# Patient Record
Sex: Male | Born: 1935 | Race: White | Hispanic: No | Marital: Married | Smoking: Former smoker
Health system: Southern US, Community
[De-identification: ages and names within clinical notes are randomized; demographics above are authoritative.]

## PROBLEM LIST (undated history)

## (undated) DIAGNOSIS — I2510 Atherosclerotic heart disease of native coronary artery without angina pectoris: Secondary | ICD-10-CM

## (undated) DIAGNOSIS — D801 Nonfamilial hypogammaglobulinemia: Principal | ICD-10-CM

## (undated) DIAGNOSIS — N183 Chronic kidney disease, stage 3 unspecified: Secondary | ICD-10-CM

## (undated) DIAGNOSIS — J45909 Unspecified asthma, uncomplicated: Secondary | ICD-10-CM

## (undated) DIAGNOSIS — I10 Essential (primary) hypertension: Secondary | ICD-10-CM

## (undated) DIAGNOSIS — C9110 Chronic lymphocytic leukemia of B-cell type not having achieved remission: Secondary | ICD-10-CM

## (undated) DIAGNOSIS — C801 Malignant (primary) neoplasm, unspecified: Secondary | ICD-10-CM

## (undated) DIAGNOSIS — M109 Gout, unspecified: Secondary | ICD-10-CM

## (undated) DIAGNOSIS — N200 Calculus of kidney: Secondary | ICD-10-CM

## (undated) DIAGNOSIS — E785 Hyperlipidemia, unspecified: Secondary | ICD-10-CM

## (undated) DIAGNOSIS — C4490 Unspecified malignant neoplasm of skin, unspecified: Secondary | ICD-10-CM

## (undated) DIAGNOSIS — Z91041 Radiographic dye allergy status: Secondary | ICD-10-CM

## (undated) DIAGNOSIS — G459 Transient cerebral ischemic attack, unspecified: Secondary | ICD-10-CM

## (undated) DIAGNOSIS — H9190 Unspecified hearing loss, unspecified ear: Secondary | ICD-10-CM

## (undated) HISTORY — DX: Calculus of kidney: N20.0

## (undated) HISTORY — DX: Nonfamilial hypogammaglobulinemia: D80.1

## (undated) HISTORY — DX: Transient cerebral ischemic attack, unspecified: G45.9

## (undated) HISTORY — DX: Gout, unspecified: M10.9

## (undated) HISTORY — DX: Radiographic dye allergy status: Z91.041

## (undated) HISTORY — DX: Essential (primary) hypertension: I10

## (undated) HISTORY — DX: Chronic lymphocytic leukemia of B-cell type not having achieved remission: C91.10

## (undated) HISTORY — DX: Atherosclerotic heart disease of native coronary artery without angina pectoris: I25.10

## (undated) HISTORY — DX: Hyperlipidemia, unspecified: E78.5

## (undated) HISTORY — PX: OTHER SURGICAL HISTORY: SHX169

---

## 1984-11-26 HISTORY — PX: CORONARY ARTERY BYPASS GRAFT: SHX141

## 1994-11-26 HISTORY — PX: CORONARY ARTERY BYPASS GRAFT: SHX141

## 1999-04-22 ENCOUNTER — Emergency Department (HOSPITAL_COMMUNITY): Admission: EM | Admit: 1999-04-22 | Discharge: 1999-04-22 | Payer: Self-pay | Admitting: Emergency Medicine

## 1999-04-22 ENCOUNTER — Encounter: Payer: Self-pay | Admitting: Emergency Medicine

## 2000-06-04 ENCOUNTER — Inpatient Hospital Stay (HOSPITAL_COMMUNITY): Admission: AD | Admit: 2000-06-04 | Discharge: 2000-06-06 | Payer: Self-pay | Admitting: *Deleted

## 2000-06-04 ENCOUNTER — Encounter: Payer: Self-pay | Admitting: *Deleted

## 2004-07-20 ENCOUNTER — Ambulatory Visit (HOSPITAL_COMMUNITY): Admission: RE | Admit: 2004-07-20 | Discharge: 2004-07-20 | Payer: Self-pay | Admitting: *Deleted

## 2004-08-03 ENCOUNTER — Encounter (INDEPENDENT_AMBULATORY_CARE_PROVIDER_SITE_OTHER): Payer: Self-pay | Admitting: *Deleted

## 2004-08-03 ENCOUNTER — Inpatient Hospital Stay (HOSPITAL_COMMUNITY): Admission: RE | Admit: 2004-08-03 | Discharge: 2004-08-04 | Payer: Self-pay | Admitting: *Deleted

## 2004-09-25 ENCOUNTER — Emergency Department (HOSPITAL_COMMUNITY): Admission: EM | Admit: 2004-09-25 | Discharge: 2004-09-25 | Payer: Self-pay | Admitting: Emergency Medicine

## 2005-02-13 ENCOUNTER — Ambulatory Visit: Payer: Self-pay | Admitting: *Deleted

## 2005-02-13 ENCOUNTER — Ambulatory Visit: Payer: Self-pay

## 2005-06-13 ENCOUNTER — Ambulatory Visit: Payer: Self-pay | Admitting: *Deleted

## 2005-10-11 ENCOUNTER — Observation Stay (HOSPITAL_COMMUNITY): Admission: EM | Admit: 2005-10-11 | Discharge: 2005-10-12 | Payer: Self-pay | Admitting: Emergency Medicine

## 2005-10-12 ENCOUNTER — Ambulatory Visit: Payer: Self-pay | Admitting: *Deleted

## 2005-10-17 ENCOUNTER — Ambulatory Visit: Payer: Self-pay | Admitting: *Deleted

## 2005-10-17 ENCOUNTER — Ambulatory Visit: Payer: Self-pay

## 2005-10-23 ENCOUNTER — Ambulatory Visit: Payer: Self-pay | Admitting: *Deleted

## 2005-11-20 ENCOUNTER — Ambulatory Visit: Payer: Self-pay | Admitting: *Deleted

## 2006-01-24 ENCOUNTER — Ambulatory Visit: Payer: Self-pay | Admitting: *Deleted

## 2006-01-31 ENCOUNTER — Ambulatory Visit: Payer: Self-pay | Admitting: *Deleted

## 2006-05-14 ENCOUNTER — Ambulatory Visit: Payer: Self-pay | Admitting: *Deleted

## 2006-05-15 ENCOUNTER — Ambulatory Visit (HOSPITAL_COMMUNITY): Admission: RE | Admit: 2006-05-15 | Discharge: 2006-05-16 | Payer: Self-pay | Admitting: Cardiology

## 2006-05-15 ENCOUNTER — Ambulatory Visit: Payer: Self-pay | Admitting: Cardiology

## 2006-05-15 ENCOUNTER — Inpatient Hospital Stay (HOSPITAL_BASED_OUTPATIENT_CLINIC_OR_DEPARTMENT_OTHER): Admission: RE | Admit: 2006-05-15 | Discharge: 2006-05-15 | Payer: Self-pay | Admitting: Cardiology

## 2006-06-11 ENCOUNTER — Ambulatory Visit: Payer: Self-pay | Admitting: *Deleted

## 2006-09-24 ENCOUNTER — Ambulatory Visit: Payer: Self-pay | Admitting: *Deleted

## 2006-09-25 ENCOUNTER — Ambulatory Visit: Payer: Self-pay | Admitting: *Deleted

## 2006-12-16 ENCOUNTER — Ambulatory Visit: Payer: Self-pay | Admitting: *Deleted

## 2007-02-13 ENCOUNTER — Ambulatory Visit: Payer: Self-pay

## 2007-04-24 ENCOUNTER — Ambulatory Visit: Payer: Self-pay | Admitting: *Deleted

## 2007-05-01 ENCOUNTER — Ambulatory Visit: Payer: Self-pay | Admitting: *Deleted

## 2007-05-01 ENCOUNTER — Ambulatory Visit: Payer: Self-pay

## 2007-05-01 LAB — CONVERTED CEMR LAB
Calcium: 9.6 mg/dL (ref 8.4–10.5)
Cholesterol: 180 mg/dL (ref 0–200)
Creatinine, Ser: 1.4 mg/dL (ref 0.4–1.5)
GFR calc Af Amer: 64 mL/min
Glucose, Bld: 116 mg/dL — ABNORMAL HIGH (ref 70–99)
HDL: 26.8 mg/dL — ABNORMAL LOW (ref >39.0)
Potassium: 4.1 meq/L (ref 3.5–5.1)
Total CHOL/HDL Ratio: 6.7
Triglycerides: 307 mg/dL (ref 0–149)
VLDL: 61 mg/dL — ABNORMAL HIGH (ref 0–40)

## 2007-07-09 ENCOUNTER — Ambulatory Visit: Payer: Self-pay | Admitting: Cardiology

## 2007-12-02 ENCOUNTER — Ambulatory Visit: Payer: Self-pay | Admitting: Cardiology

## 2007-12-12 ENCOUNTER — Ambulatory Visit: Payer: Self-pay

## 2007-12-12 LAB — CONVERTED CEMR LAB
ALT: 21 units/L (ref 0–53)
Alkaline Phosphatase: 48 units/L (ref 39–117)
BUN: 20 mg/dL (ref 6–23)
Bilirubin, Direct: 0.1 mg/dL (ref 0.0–0.3)
CO2: 28 meq/L (ref 19–32)
Chloride: 105 meq/L (ref 96–112)
Cholesterol: 155 mg/dL (ref 0–200)
Direct LDL: 72.2 mg/dL
Glucose, Bld: 119 mg/dL — ABNORMAL HIGH (ref 70–99)
HDL: 26.9 mg/dL — ABNORMAL LOW (ref >39.0)
Triglycerides: 209 mg/dL (ref 0–149)

## 2008-01-07 ENCOUNTER — Ambulatory Visit: Payer: Self-pay | Admitting: Cardiology

## 2008-08-13 ENCOUNTER — Ambulatory Visit: Payer: Self-pay

## 2008-08-14 ENCOUNTER — Emergency Department (HOSPITAL_COMMUNITY): Admission: EM | Admit: 2008-08-14 | Discharge: 2008-08-14 | Payer: Self-pay | Admitting: Emergency Medicine

## 2008-08-18 ENCOUNTER — Ambulatory Visit: Payer: Self-pay | Admitting: Cardiology

## 2008-11-30 ENCOUNTER — Ambulatory Visit: Payer: Self-pay

## 2009-01-26 ENCOUNTER — Ambulatory Visit: Payer: Self-pay | Admitting: Cardiology

## 2009-03-28 ENCOUNTER — Encounter: Payer: Self-pay | Admitting: Cardiology

## 2009-04-04 ENCOUNTER — Encounter: Payer: Self-pay | Admitting: Cardiology

## 2009-05-18 ENCOUNTER — Encounter (INDEPENDENT_AMBULATORY_CARE_PROVIDER_SITE_OTHER): Payer: Self-pay | Admitting: *Deleted

## 2009-05-23 ENCOUNTER — Encounter: Payer: Self-pay | Admitting: Cardiology

## 2009-06-24 ENCOUNTER — Encounter: Payer: Self-pay | Admitting: Cardiovascular Disease

## 2009-06-24 DIAGNOSIS — I6529 Occlusion and stenosis of unspecified carotid artery: Secondary | ICD-10-CM | POA: Insufficient documentation

## 2009-07-19 ENCOUNTER — Encounter: Payer: Self-pay | Admitting: Cardiology

## 2009-07-26 DIAGNOSIS — G459 Transient cerebral ischemic attack, unspecified: Secondary | ICD-10-CM | POA: Insufficient documentation

## 2009-07-26 DIAGNOSIS — M109 Gout, unspecified: Secondary | ICD-10-CM | POA: Insufficient documentation

## 2009-07-26 DIAGNOSIS — N259 Disorder resulting from impaired renal tubular function, unspecified: Secondary | ICD-10-CM | POA: Insufficient documentation

## 2009-07-26 DIAGNOSIS — I2510 Atherosclerotic heart disease of native coronary artery without angina pectoris: Secondary | ICD-10-CM

## 2009-07-26 DIAGNOSIS — N200 Calculus of kidney: Secondary | ICD-10-CM | POA: Insufficient documentation

## 2009-07-26 DIAGNOSIS — I10 Essential (primary) hypertension: Secondary | ICD-10-CM

## 2009-07-26 DIAGNOSIS — E785 Hyperlipidemia, unspecified: Secondary | ICD-10-CM | POA: Insufficient documentation

## 2009-07-26 DIAGNOSIS — I679 Cerebrovascular disease, unspecified: Secondary | ICD-10-CM | POA: Insufficient documentation

## 2009-07-27 ENCOUNTER — Ambulatory Visit: Payer: Self-pay | Admitting: Cardiology

## 2009-07-27 ENCOUNTER — Encounter: Payer: Self-pay | Admitting: Cardiology

## 2009-07-27 DIAGNOSIS — Z91041 Radiographic dye allergy status: Secondary | ICD-10-CM

## 2009-09-23 ENCOUNTER — Encounter: Admission: RE | Admit: 2009-09-23 | Discharge: 2009-09-23 | Payer: Self-pay | Admitting: Orthopedic Surgery

## 2009-09-26 ENCOUNTER — Encounter: Payer: Self-pay | Admitting: Cardiology

## 2009-09-26 ENCOUNTER — Encounter (INDEPENDENT_AMBULATORY_CARE_PROVIDER_SITE_OTHER): Payer: Self-pay | Admitting: *Deleted

## 2009-09-26 ENCOUNTER — Telehealth: Payer: Self-pay | Admitting: Cardiology

## 2009-10-26 HISTORY — PX: TOTAL KNEE ARTHROPLASTY: SHX125

## 2009-10-27 ENCOUNTER — Telehealth: Payer: Self-pay | Admitting: Cardiology

## 2009-11-01 ENCOUNTER — Inpatient Hospital Stay (HOSPITAL_COMMUNITY): Admission: RE | Admit: 2009-11-01 | Discharge: 2009-11-03 | Payer: Self-pay | Admitting: Orthopedic Surgery

## 2009-12-22 ENCOUNTER — Telehealth (INDEPENDENT_AMBULATORY_CARE_PROVIDER_SITE_OTHER): Payer: Self-pay | Admitting: *Deleted

## 2009-12-23 ENCOUNTER — Encounter: Admission: RE | Admit: 2009-12-23 | Discharge: 2009-12-23 | Payer: Self-pay | Admitting: Orthopedic Surgery

## 2009-12-27 ENCOUNTER — Ambulatory Visit: Payer: Self-pay

## 2009-12-27 ENCOUNTER — Encounter: Payer: Self-pay | Admitting: Cardiology

## 2009-12-27 ENCOUNTER — Ambulatory Visit: Payer: Self-pay | Admitting: Internal Medicine

## 2009-12-27 ENCOUNTER — Encounter (HOSPITAL_COMMUNITY): Admission: RE | Admit: 2009-12-27 | Discharge: 2010-03-02 | Payer: Self-pay | Admitting: Cardiology

## 2010-01-18 ENCOUNTER — Encounter: Payer: Self-pay | Admitting: Cardiology

## 2010-01-18 ENCOUNTER — Ambulatory Visit: Payer: Self-pay | Admitting: Cardiology

## 2010-02-17 ENCOUNTER — Encounter: Admission: RE | Admit: 2010-02-17 | Discharge: 2010-02-17 | Payer: Self-pay | Admitting: Orthopedic Surgery

## 2010-11-01 ENCOUNTER — Ambulatory Visit: Payer: Self-pay | Admitting: Cardiology

## 2010-11-01 ENCOUNTER — Encounter: Payer: Self-pay | Admitting: Cardiology

## 2010-12-26 NOTE — Progress Notes (Signed)
Summary: Nuclear Pre-Procedure  Phone Note Outgoing Call   Call placed by: Milana Na, EMT-P,  December 22, 2009 3:24 PM Summary of Call: Reviewed information on Myoview Information Sheet (see scanned document for further details).  Spoke with patient's wife.     Nuclear Med Background Indications for Stress Test: Evaluation for Ischemia, Graft Patency, Stent Patency, PTCA Patency   History: Angioplasty, CABG, COPD, Heart Catheterization, Myocardial Infarction, Myocardial Perfusion Study, Stents  History Comments: '88 MI-CABG '88 CABG '96 Re-do x 3 '07 Angioplasty SVG-OM and CFX '07 STENTS SVG OM and CFX '09 MPS (-) ischemia  09/05 (R) CEA     Nuclear Pre-Procedure Cardiac Risk Factors: Carotid Disease, Family History - CAD, History of Smoking, Lipids, PVD, TIA Height (in): 68  Nuclear Med Study Referring MD:  B.Crenshaw

## 2010-12-26 NOTE — Letter (Signed)
Summary: Aundra Dubin MD  Aundra Dubin MD   Imported By: Lenard Forth 01/12/2010 12:45:20  _____________________________________________________________________  External Attachment:    Type:   Image     Comment:   External Document

## 2010-12-26 NOTE — Assessment & Plan Note (Signed)
Summary: [facility]  Medications Added FISH OIL 1200 MG CAPS (OMEGA-3 FATTY ACIDS) Take 1 capsule by mouth once a day SAW PALMETTO 500 MG CAPS (SAW PALMETTO (SERENOA REPENS)) Take 1 capsule by mouth once a day        Visit Type:  6 months follow up  CC:  No complains.  History of Present Illness: [name] [name] is a very pleasant gentleman with a history of coronary artery disease. He has a history of  myocardial infarction and coronary artery bypass grafting in 1986 and he had a redo coronary artery bypass grafting in 1996. Cardiac catheterization in June of 2007 revealed normal LV functionl. His LAD was totally occluded. The LIMA to the LAD was widely patent and there was a 40% stenosis at the insertion into the LAD. The circumflex was totalled and the saphenous vein graft to the circumflex had a 95% stenosis of the proximal portion. The right coronary artery was occluded and the RIMA to the right coronary artery was intact. The patient had a bare metal stent at that time to the saphenous vein graft to the circumflex. Note he had an abdominal ultrasound in June of 2008 that showed no aneurysm. He has also had a history of a carotid endarterectomy and his most recent carotid Doppler performed in Feb  2011 showed a 40% to 59% left internal carotid artery stenosis and a 0% to 39% right. Followup recommended in one year. Last Myoview was performed in feb, 2011 showed normal perfusion. Ejection fraction was 62%. I last saw him in Sept of 2010. Since then he has head knee replacement. He has dyspnea with more extreme activities but not with routine activities. It is relieved with rest. There is no associated chest pain. There is no orthopnea, PND, pedal edema, palpitations or syncope.  Current Medications (verified): 1)  Metoprolol Tartrate 50 Mg Tabs (Metoprolol Tartrate) .... Take One Tablet By Mouth Twice A Day 2)  Amlodipine Besylate 10 Mg Tabs (Amlodipine Besylate) .... Take One Tablet By  Mouth Daily 3)  Vasotec 10 Mg Tabs (Enalapril Maleate) .... Take 1 Tablet By Mouth Once A Day 4)  Hydrochlorothiazide 25 Mg Tabs (Hydrochlorothiazide) .... Take One Tablet By Mouth Daily. 5)  Red Yeast Rice 600 Mg Tabs (Red Yeast Rice Extract) .... Take 1 Tablet By Mouth Two Times A Day 6)  Fish Oil 1200 Mg Caps (Omega-3 Fatty Acids) .... Take 1 Capsule By Mouth Once A Day 7)  Vitamin C 500 Mg  Tabs (Ascorbic Acid) .... Take 1 Tablet By Mouth Once A Day 8)  Calcium Carbonate-Vitamin D 600-400 Mg-Unit  Tabs (Calcium Carbonate-Vitamin D) .... Take 1 Tablet By Mouth Once A Day 9)  Allopurinol 100 Mg Tabs (Allopurinol) .... Take 1 Tablet By Mouth Once A Day 10)  Aspirin 81 Mg Tbec (Aspirin) .... Take One Tablet By Mouth Daily 11)  Vitamin D 2,000 Iu .... Two Times A Day 12)  Benadryl 25 Mg Caps (Diphenhydramine Hcl) .... Take 1 Capsule By Mouth Once A Day 13)  Saw Palmetto 500 Mg Caps (Saw Palmetto (Serenoa Repens)) .... Take 1 Capsule By Mouth Once A Day  Allergies: 1)  ! Uloric (Febuxostat) 2)  ! * Meloxican  Past History:  Past Medical History: Reviewed history from 07/27/2009 and no changes required. Current Problems:  TRANSIENT ISCHEMIC ATTACK (ICD-435.9) HYPERLIPIDEMIA (ICD-272.4) HYPERTENSION (ICD-401.9) CAD (ICD-414.00) CEREBROVASCULAR DISEASE (ICD-437.9) NEPHROLITHIASIS (ICD-592.0) GOUT (ICD-274.9) RENAL INSUFFICIENCY (ICD-588.9) Remote history of carbon monoxide poisoning.  He has a history  of initial myocardial  infarction  Past Surgical History:    coronary artery bypass grafting in 1986 and he had a redo  coronary artery bypass grafting in 1996.  History of carotid endarterectomy.  Status post knee replacement 12/10  Social History: Reviewed history from 07/27/2009 and no changes required.  The patient lives in [place] with his wife.  He is a  retired Games developer.  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  He used to smoke.  He quit 40 years ago, and has a 10 pack  year history.  He never drinks alcohol.  He bicycles approximately 2 miles a day.  Review of Systems       Some pain in right knee from recent replacement but no fevers or chills, productive cough, hemoptysis, dysphasia, odynophagia, melena, hematochezia, dysuria, hematuria, rash, seizure activity, orthopnea, PND, pedal edema, claudication. Remaining systems are negative.   Vital Signs:  Patient profile:   77 year old male Height:      68 inches Weight:      183 pounds BMI:     27.93 Pulse rate:   64 / minute Pulse rhythm:   regular Resp:     18 per minute BP sitting:   130 / 66  (left arm) Cuff size:   large  Vitals Entered By: [name] [name] (January 18, 2010 2:25 PM)  Physical Exam  General:  Well-developed well-nourished in no acute distress.  Skin is warm and dry.  HEENT is normal.  Neck is supple. No thyromegaly.  Chest is clear to auscultation with normal expansion.  Cardiovascular exam is regular rate and rhythm.  Abdominal exam nontender or distended. No masses palpated. Extremities show no edema. Status post right knee replacement; incision without evidence of infection. neuro grossly intact    Impression & Recommendations:  Problem # 1:  CAROTID ARTERY DISEASE (ICD-433.10) Continue aspirin. History of statin intolerance. Followup carotid Dopplers February 2012. His updated medication list for this problem includes:    Aspirin 81 Mg Tbec (Aspirin) .Marland Kitchen... Take one tablet by mouth daily  Problem # 2:  GOUT (ICD-274.9)  His updated medication list for this problem includes:    Allopurinol 100 Mg Tabs (Allopurinol) .Marland Kitchen... Take 1 tablet by mouth once a day  Problem # 3:  HYPERLIPIDEMIA (ICD-272.4) Intolerant to statins. Continue fish oil and red yeast rice. Lipids and liver monitored by his primary care.  Problem # 4:  HYPERTENSION (ICD-401.9) Blood pressure controlled on present medications. Will continue. His updated medication list for this problem  includes:    Metoprolol Tartrate 50 Mg Tabs (Metoprolol tartrate) .Marland Kitchen... Take one tablet by mouth twice a day    Amlodipine Besylate 10 Mg Tabs (Amlodipine besylate) .Marland Kitchen... Take one tablet by mouth daily    Vasotec 10 Mg Tabs (Enalapril maleate) .Marland Kitchen... Take 1 tablet by mouth once a day    Hydrochlorothiazide 25 Mg Tabs (Hydrochlorothiazide) .Marland Kitchen... Take one tablet by mouth daily.    Aspirin 81 Mg Tbec (Aspirin) .Marland Kitchen... Take one tablet by mouth daily  Problem # 5:  CONTRAST DYE ALLERGY, HX OF (ICD-V15.08)  Problem # 6:  CAD (ICD-414.00) Continue aspirin, beta blocker and ACE inhibitor. Intolerant to statins. His updated medication list for this problem includes:    Metoprolol Tartrate 50 Mg Tabs (Metoprolol tartrate) .Marland Kitchen... Take one tablet by mouth twice a day    Amlodipine Besylate 10 Mg Tabs (Amlodipine besylate) .Marland Kitchen... Take one tablet by mouth daily    Vasotec 10 Mg Tabs (Enalapril maleate) .Marland KitchenMarland KitchenMarland KitchenMarland Kitchen  Take 1 tablet by mouth once a day    Aspirin 81 Mg Tbec (Aspirin) .Marland Kitchen... Take one tablet by mouth daily  Patient Instructions: 1)  Your physician recommends that you schedule a follow-up appointment in: 9 MONTHS

## 2010-12-26 NOTE — Assessment & Plan Note (Signed)
Summary: [facility]   Visit Type:  Follow-up   History of Present Illness: [name] [name] is a very pleasant gentleman with a history of coronary artery disease. He has a history of  myocardial infarction and coronary artery bypass grafting in 1986 and he had a redo coronary artery bypass grafting in 1996. Cardiac catheterization in June of 2007 revealed normal LV functionl. His LAD was totally occluded. The LIMA to the LAD was widely patent and there was a 40% stenosis at the insertion into the LAD. The circumflex was totalled and the saphenous vein graft to the circumflex had a 95% stenosis of the proximal portion. The right coronary artery was occluded and the RIMA to the right coronary artery was intact. The patient had a bare metal stent at that time to the saphenous vein graft to the circumflex. Note he had an abdominal ultrasound in June of 2008 that showed no aneurysm. He has also had a history of a carotid endarterectomy and his most recent carotid Doppler performed in Feb  2011 showed a 40% to 59% left internal carotid artery stenosis and a 0% to 39% right. Followup recommended in one year. Last Myoview was performed in feb, 2011 showed normal perfusion. Ejection fraction was 62%. I last saw him in Feb 2011. Since then he has he has dyspnea with more extreme activities but not with routine activities. It is relieved with rest. There is no associated chest pain. There is no orthopnea, PND, pedal edema, palpitations or syncope.   Current Medications (verified): 1)  Metoprolol Tartrate 50 Mg Tabs (Metoprolol Tartrate) .... Take One Tablet By Mouth Twice A Day 2)  Amlodipine Besylate 10 Mg Tabs (Amlodipine Besylate) .... Take One Tablet By Mouth Daily 3)  Vasotec 10 Mg Tabs (Enalapril Maleate) .... Take 1 Tablet By Mouth Once A Day 4)  Hydrochlorothiazide 25 Mg Tabs (Hydrochlorothiazide) .... Take One Tablet By Mouth Daily. 5)  Red Yeast Rice 600 Mg Tabs (Red Yeast Rice Extract) .... Take  1 Tablet By Mouth Two Times A Day 6)  Fish Oil 1200 Mg Caps (Omega-3 Fatty Acids) .... Take 1 Capsule By Mouth Once A Day 7)  Vitamin C 500 Mg  Tabs (Ascorbic Acid) .... Take 1 Tablet By Mouth Once A Day 8)  Calcium Carbonate-Vitamin D 600-400 Mg-Unit  Tabs (Calcium Carbonate-Vitamin D) .... Take 1 Tablet By Mouth Once A Day 9)  Allopurinol 100 Mg Tabs (Allopurinol) .... Take 1 Tablet By Mouth Once A Day 10)  Aspirin 81 Mg Tbec (Aspirin) .... Take One Tablet By Mouth Daily 11)  Vitamin D 2,000 Iu .... Two Times A Day 12)  Benadryl 25 Mg Caps (Diphenhydramine Hcl) .... Take 1 Capsule By Mouth Once A Day 13)  Saw Palmetto 500 Mg Caps (Saw Palmetto (Serenoa Repens)) .... Take 1 Capsule By Mouth Once A Day 14)  Amoxicillin 500 Mg Caps (Amoxicillin) .... 2 Tablets Three Times A Day  Allergies: 1)  ! Uloric (Febuxostat) 2)  ! * Meloxican  Past History:  Past Medical History: TRANSIENT ISCHEMIC ATTACK (ICD-435.9) HYPERLIPIDEMIA (ICD-272.4) HYPERTENSION (ICD-401.9) CAD (ICD-414.00) CEREBROVASCULAR DISEASE (ICD-437.9) NEPHROLITHIASIS (ICD-592.0) GOUT (ICD-274.9) RENAL INSUFFICIENCY (ICD-588.9) Remote history of carbon monoxide poisoning.  He has a history of initial myocardial  infarction  Social History: Reviewed history from 07/27/2009 and no changes required.  The patient lives in [place] with his wife.  He is a  retired Games developer.  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 Remote 10 pack year history of tobacco abuse.  He never drinks alcohol.  He bicycles approximately 2 miles a day.  Review of Systems       Recent chest cold but rno fevers or chills,  hemoptysis, dysphasia, odynophagia, melena, hematochezia, dysuria, hematuria, rash, seizure activity, orthopnea, PND, pedal edema, claudication. Remaining systems are negative.   Vital Signs:  Patient profile:   77 year old male Height:      68 inches Weight:      192.25 pounds BMI:     29.34 Pulse rate:   62 / minute Pulse  rhythm:   regular BP sitting:   149 / 79  (right arm) Cuff size:   large  Vitals Entered By: Vikki Ports (November 01, 2010 2:58 PM)  Physical Exam  General:  Well-developed well-nourished in no acute distress.  Skin is warm and dry.  HEENT is normal.  Neck is supple. No thyromegaly.  Chest is clear to auscultation with normal expansion.  Cardiovascular exam is regular rate and rhythm.  Abdominal exam nontender or distended. No masses palpated. Extremities show trace edema. neuro grossly intact    EKG  Procedure date:  11/01/2010  Findings:      sinus rhythm at a rate of 62. Occasional PVCs. Nonspecific ST changes.  Impression & Recommendations:  Problem # 1:  CAROTID ARTERY DISEASE (ICD-433.10) Continue aspirin. Intolerant to statins. Followup carotid Dopplers February 2012. His updated medication list for this problem includes:    Aspirin 81 Mg Tbec (Aspirin) .Marland Kitchen... Take one tablet by mouth daily  Problem # 2:  CONTRAST DYE ALLERGY, HX OF (ICD-V15.08)  Problem # 3:  HYPERLIPIDEMIA (ICD-272.4) Continue with red yeast rice. History of intolerance to statins.  Problem # 4:  HYPERTENSION (ICD-401.9) Blood pressure mildly elevated but he states typically normal at home. We'll follow this and adjust his regimen as indicated. Potassium and renal function monitored by primary care. His updated medication list for this problem includes:    Metoprolol Tartrate 50 Mg Tabs (Metoprolol tartrate) .Marland Kitchen... Take one tablet by mouth twice a day    Amlodipine Besylate 10 Mg Tabs (Amlodipine besylate) .Marland Kitchen... Take one tablet by mouth daily    Vasotec 10 Mg Tabs (Enalapril maleate) .Marland Kitchen... Take 1 tablet by mouth once a day    Hydrochlorothiazide 25 Mg Tabs (Hydrochlorothiazide) .Marland Kitchen... Take one tablet by mouth daily.    Aspirin 81 Mg Tbec (Aspirin) .Marland Kitchen... Take one tablet by mouth daily  Problem # 5:  CAD (ICD-414.00) Continue aspirin, beta blocker and ACE inhibitor. His updated medication list  for this problem includes:    Metoprolol Tartrate 50 Mg Tabs (Metoprolol tartrate) .Marland Kitchen... Take one tablet by mouth twice a day    Amlodipine Besylate 10 Mg Tabs (Amlodipine besylate) .Marland Kitchen... Take one tablet by mouth daily    Vasotec 10 Mg Tabs (Enalapril maleate) .Marland Kitchen... Take 1 tablet by mouth once a day    Aspirin 81 Mg Tbec (Aspirin) .Marland Kitchen... Take one tablet by mouth daily  Problem # 6:  RENAL INSUFFICIENCY (ICD-588.9) Followed by primary care.  Patient Instructions: 1)  Your physician wants you to follow-up in: 6 MONTHS  You will receive a reminder letter in the mail two months in advance. If you don't receive a letter, please call our office to schedule the follow-up appointment.

## 2010-12-26 NOTE — Miscellaneous (Signed)
Summary: Orders Update  Clinical Lists Changes  Orders: Added new Test order of Carotid Duplex (Carotid Duplex) - Signed 

## 2010-12-26 NOTE — Assessment & Plan Note (Signed)
Summary: Cardiology nuclear Study  Nuclear Med Background Indications for Stress Test: Evaluation for Ischemia, Graft Patency, Stent Patency, PTCA Patency   History: Angioplasty, CABG, COPD, Heart Catheterization, Myocardial Infarction, Myocardial Perfusion Study, Stents  History Comments: '88 MI>CABG; '96 Re-do CABG x ''07 PTCA/Stent; '09 MPS:no ischemia,  09/05 (R) CEA   Symptoms Comments: NO CARDIAC COMPLAINTS   Nuclear Pre-Procedure Cardiac Risk Factors: Carotid Disease, Family History - CAD, History of Smoking, Lipids, PVD, TIA Caffeine/Decaff Intake: None NPO After: 7:30 AM Lungs: Clear.  O2 Sat 98% on RA. IV 0.9% NS with Angio Cath: 20g     IV Site: (R) AC IV Started by: Stanton Kidney EMT-P Chest Size (in) 44     Height (in): 68 Weight (lb): 183 BMI: 27.93  Nuclear Med Study 1 or 2 day study:  1 day     Stress Test Type:  Eugenie Birks Reading MD:  Arvilla Meres, MD     Referring MD:  Olga Millers, MD Resting Radionuclide:  Technetium 15m Tetrofosmin     Resting Radionuclide Dose:  11.0 mCi  Stress Radionuclide:  Technetium 79m Tetrofosmin     Stress Radionuclide Dose:  33.0 mCi   Stress Protocol   Lexiscan: 0.4 mg   Stress Test Technologist:  Rea College CMA-N     Nuclear Technologist:  Burna Mortimer Deal RT-N  Rest Procedure  Myocardial perfusion imaging was performed at rest 45 minutes following the intravenous administration of Myoview Technetium 78m Tetrofosmin.  Stress Procedure  The patient received IV Lexiscan 0.4 mg over 15-seconds.  Myoview injected at 30-seconds.  There were no significant changes with infusion, occasional PVC's in recovery.  He did c/o chest tightness with lexiscan.  Quantitative spect images were obtained after a 45 minute delay.  QPS Raw Data Images:  Normal; no motion artifact; normal heart/lung ratio. Stress Images:  There is normal uptake in all areas. Rest Images:  Normal homogeneous uptake in all areas of the myocardium. Subtraction  (SDS):  Normal Transient Ischemic Dilatation:  .88  (Normal <1.22)  Lung/Heart Ratio:  .36  (Normal <0.45)  Quantitative Gated Spect Images QGS EDV:  82 ml QGS ESV:  28 ml QGS EF:  66 % QGS cine images:  Septal hypokinesis  Findings Normal nuclear study      Overall Impression  Exercise Capacity: Lexiscan study with no exercise. ECG Impression: Baseline: NSR; No significant ST segment change with Lexiscan. Overall Impression: Normal stress nuclear study.  Appended Document: Cardiology nuclear Study ok  Appended Document: Cardiology nuclear Study pt aware of results

## 2011-01-10 ENCOUNTER — Encounter: Payer: Self-pay | Admitting: Cardiology

## 2011-01-12 ENCOUNTER — Encounter: Payer: Self-pay | Admitting: Cardiology

## 2011-01-12 ENCOUNTER — Encounter (INDEPENDENT_AMBULATORY_CARE_PROVIDER_SITE_OTHER): Payer: Medicare Other

## 2011-01-12 DIAGNOSIS — I6529 Occlusion and stenosis of unspecified carotid artery: Secondary | ICD-10-CM

## 2011-01-17 NOTE — Miscellaneous (Signed)
Summary: Orders Update  Clinical Lists Changes  Orders: Added new Test order of Carotid Duplex (Carotid Duplex) - Signed 

## 2011-02-27 LAB — BASIC METABOLIC PANEL
BUN: 16 mg/dL (ref 6–23)
BUN: 23 mg/dL (ref 6–23)
Calcium: 8.3 mg/dL — ABNORMAL LOW (ref 8.4–10.5)
Calcium: 8.6 mg/dL (ref 8.4–10.5)
Chloride: 103 mEq/L (ref 96–112)
Chloride: 104 mEq/L (ref 96–112)
Creatinine, Ser: 1.07 mg/dL (ref 0.4–1.5)
Creatinine, Ser: 1.23 mg/dL (ref 0.4–1.5)
GFR calc Af Amer: >60 mL/min (ref >60)
GFR calc Af Amer: >60 mL/min (ref >60)
GFR calc non Af Amer: 57 mL/min — ABNORMAL LOW (ref >60)
GFR calc non Af Amer: >60 mL/min (ref >60)
Glucose, Bld: 119 mg/dL — ABNORMAL HIGH (ref 70–99)
Glucose, Bld: 119 mg/dL — ABNORMAL HIGH (ref 70–99)
Potassium: 3.7 mEq/L (ref 3.5–5.1)
Sodium: 135 mEq/L (ref 135–145)
Sodium: 140 mEq/L (ref 135–145)

## 2011-02-27 LAB — CBC
HCT: 39.9 % (ref 39.0–52.0)
Hemoglobin: 10 g/dL — ABNORMAL LOW (ref 13.0–17.0)
Hemoglobin: 13.3 g/dL (ref 13.0–17.0)
MCHC: 33.2 g/dL (ref 30.0–36.0)
MCV: 91.7 fL (ref 78.0–100.0)
MCV: 92 fL (ref 78.0–100.0)
Platelets: 161 10*3/uL (ref 150–400)
Platelets: 171 10*3/uL (ref 150–400)
RBC: 2.98 MIL/uL — ABNORMAL LOW (ref 4.22–5.81)
RBC: 4.35 MIL/uL (ref 4.22–5.81)
RDW: 15.9 % — ABNORMAL HIGH (ref 11.5–15.5)
WBC: 7 10*3/uL (ref 4.0–10.5)
WBC: 7.6 10*3/uL (ref 4.0–10.5)

## 2011-02-27 LAB — URINALYSIS, ROUTINE W REFLEX MICROSCOPIC
Glucose, UA: NEGATIVE mg/dL
Ketones, ur: NEGATIVE mg/dL
Nitrite: NEGATIVE
Protein, ur: NEGATIVE mg/dL

## 2011-02-27 LAB — DIFFERENTIAL
Basophils Absolute: 0 10*3/uL (ref 0.0–0.1)
Eosinophils Absolute: 0.2 10*3/uL (ref 0.0–0.7)
Eosinophils Relative: 2 % (ref 0–5)
Lymphocytes Relative: 24 % (ref 12–46)
Lymphs Abs: 1.7 10*3/uL (ref 0.7–4.0)
Monocytes Absolute: 0.7 10*3/uL (ref 0.1–1.0)

## 2011-02-27 LAB — TYPE AND SCREEN

## 2011-02-27 LAB — ABO/RH: ABO/RH(D): A POS

## 2011-02-27 LAB — APTT: aPTT: 26 seconds (ref 24–37)

## 2011-03-13 ENCOUNTER — Other Ambulatory Visit: Payer: Self-pay | Admitting: Cardiology

## 2011-03-14 NOTE — Telephone Encounter (Signed)
Pt states he takes 1 tab po qd he only takes an extra tab if swelling increases

## 2011-03-20 ENCOUNTER — Encounter: Payer: Self-pay | Admitting: Cardiology

## 2011-03-21 ENCOUNTER — Encounter: Payer: Self-pay | Admitting: *Deleted

## 2011-03-21 ENCOUNTER — Encounter: Payer: Self-pay | Admitting: Cardiology

## 2011-03-21 ENCOUNTER — Ambulatory Visit (INDEPENDENT_AMBULATORY_CARE_PROVIDER_SITE_OTHER): Payer: Medicare Other | Admitting: Cardiology

## 2011-03-21 DIAGNOSIS — I679 Cerebrovascular disease, unspecified: Secondary | ICD-10-CM

## 2011-03-21 DIAGNOSIS — E785 Hyperlipidemia, unspecified: Secondary | ICD-10-CM

## 2011-03-21 DIAGNOSIS — I2510 Atherosclerotic heart disease of native coronary artery without angina pectoris: Secondary | ICD-10-CM

## 2011-03-21 DIAGNOSIS — I10 Essential (primary) hypertension: Secondary | ICD-10-CM

## 2011-03-21 DIAGNOSIS — Z01810 Encounter for preprocedural cardiovascular examination: Secondary | ICD-10-CM

## 2011-03-21 NOTE — Assessment & Plan Note (Signed)
Continue present medications. 

## 2011-03-21 NOTE — Assessment & Plan Note (Signed)
Intolerant to statins. Continue present medications. Managed by primary care.

## 2011-03-21 NOTE — Progress Notes (Signed)
HPI:[name] is a very pleasant gentleman with a history of coronary artery disease. He has a history of  myocardial infarction and coronary artery bypass grafting in 1986 and he had a redo coronary artery bypass grafting in 1996. Cardiac catheterization in June of 2007 revealed normal LV functionl. His LAD was totally occluded. The LIMA to the LAD was widely patent and there was a 40% stenosis at the insertion into the LAD. The circumflex was totalled and the saphenous vein graft to the circumflex had a 95% stenosis of the proximal portion. The right coronary artery was occluded and the RIMA to the right coronary artery was intact. The patient had a bare metal stent at that time to the saphenous vein graft to the circumflex. Note he had an abdominal ultrasound in June of 2008 that showed no aneurysm. He has also had a history of a carotid endarterectomy and his most recent carotid Doppler performed in Feb 2012 showed a 40% to 59% left internal carotid artery stenosis and a 0% to 39% right. Followup recommended in one year. Last Myoview was performed in Feb, 2011 showed normal perfusion. Ejection fraction was 62%. I last saw him in Dec 2011. 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 He is scheduled for a TURBT with [facility] instillation and we were asked to evaluate preoperatively. The patient denies any dyspnea on exertion, orthopnea, PND, pedal edema, palpitations, syncope or chest pain. He is able to ride his stationary bike for 2 miles with no chest pain or shortness of breath.    Current Outpatient Prescriptions  Medication Sig Dispense Refill  . allopurinol (ZYLOPRIM) 100 MG tablet Take 100 mg by mouth daily.        Marland Kitchen amLODipine (NORVASC) 10 MG tablet Take 10 mg by mouth daily.        Marland Kitchen aspirin 81 MG tablet Take 81 mg by mouth daily.        . Calcium Carbonate-Vitamin D 600-400 MG-UNIT per tablet Take 1 tablet by mouth daily.        . Cholecalciferol  (VITAMIN D) 2000 UNITS CAPS Take by mouth 2 (two) times daily.        . diphenhydrAMINE (BENADRYL) 25 MG tablet Take 25 mg by mouth daily.        . hydrochlorothiazide 25 MG tablet 1 tab po qd.... take an extra tab if swelling increases  60 tablet  3  . metoprolol (LOPRESSOR) 50 MG tablet Take 50 mg by mouth 2 (two) times daily.        . Omega-3 Fatty Acids (FISH OIL) 1200 MG CAPS Take by mouth.        . Red Yeast Rice 600 MG TABS Take by mouth 2 (two) times daily.        . saw palmetto 500 MG capsule Take 500 mg by mouth daily.        Marland Kitchen DISCONTD: amoxicillin (AMOXIL) 500 MG capsule Take 1,000 mg by mouth 2 (two) times daily.           Past Medical History  Diagnosis Date  . HYPERLIPIDEMIA   . GOUT   . HYPERTENSION   . CAD   . TRANSIENT ISCHEMIC ATTACK   . NEPHROLITHIASIS   . RENAL INSUFFICIENCY   . CONTRAST DYE ALLERGY, HX OF     Past Surgical History  Procedure Date  . Coronary artery bypass graft 1986  . Carotid endarterectomy   . Coronary artery  bypass graft 1996  . Total knee arthroplasty 10/2009    History   Social History  . Marital Status: Married    Spouse Name: N/A    Number of Children: N/A  . Years of Education: N/A   Occupational History  . Not on file.   Social History Main Topics  . Smoking status: Former Smoker    Types: Cigarettes    Quit date: 11/26/1965  . Smokeless tobacco: Not on file  . Alcohol Use: No  . Drug Use: No  . Sexually Active: Not on file   Other Topics Concern  . Not on file   Social History Narrative  . No narrative on file    ROS: no fevers or chills, productive cough, hemoptysis, dysphasia, odynophagia, melena, hematochezia, dysuria, hematuria, rash, seizure activity, orthopnea, PND, pedal edema, claudication. Remaining systems are negative.  Physical Exam: Well-developed well-nourished in no acute distress.  Skin is warm and dry.  HEENT is normal.  Neck is supple. No thyromegaly.  Chest is clear to auscultation  with normal expansion.  Cardiovascular exam is regular rate and rhythm.  Abdominal exam nontender or distended. No masses palpated. Extremities show no edema. neuro grossly intact  ECG Sinus rhythm at a rate of 58. No ST changes.

## 2011-03-21 NOTE — Patient Instructions (Signed)
Your physician recommends that you schedule a follow-up appointment in: 6 MONTHS 

## 2011-03-21 NOTE — Assessment & Plan Note (Signed)
Continue aspirin. Followup carotid Dopplers February 2013. 

## 2011-03-21 NOTE — Assessment & Plan Note (Signed)
Blood pressure controlled with present medications. Will continue. 

## 2011-03-21 NOTE — Assessment & Plan Note (Signed)
Patient is scheduled for urological surgery. Previous Myoview negative. No symptoms and is able to ride his bike for 2 miles. I do not think further ischemia evaluation was warranted preoperatively. We would recommend continuing all present medications pre-and postoperatively.

## 2011-03-28 ENCOUNTER — Telehealth: Payer: Self-pay | Admitting: Cardiology

## 2011-03-28 NOTE — Telephone Encounter (Signed)
All Cardiac Faxed to [facility] @  [phone]  03/28/11/Km

## 2011-04-06 ENCOUNTER — Other Ambulatory Visit: Payer: Self-pay | Admitting: Urology

## 2011-04-06 ENCOUNTER — Encounter (HOSPITAL_COMMUNITY): Payer: Medicare Other

## 2011-04-06 ENCOUNTER — Ambulatory Visit (HOSPITAL_COMMUNITY)
Admission: RE | Admit: 2011-04-06 | Discharge: 2011-04-06 | Disposition: A | Discharge status: Home / Self Care | Payer: Medicare Other | Source: Ambulatory Visit | Attending: Urology | Admitting: Urology

## 2011-04-06 DIAGNOSIS — Z01818 Encounter for other preprocedural examination: Secondary | ICD-10-CM | POA: Insufficient documentation

## 2011-04-06 DIAGNOSIS — Z951 Presence of aortocoronary bypass graft: Secondary | ICD-10-CM | POA: Insufficient documentation

## 2011-04-06 DIAGNOSIS — D494 Neoplasm of unspecified behavior of bladder: Secondary | ICD-10-CM | POA: Insufficient documentation

## 2011-04-06 DIAGNOSIS — Z01812 Encounter for preprocedural laboratory examination: Secondary | ICD-10-CM | POA: Insufficient documentation

## 2011-04-06 LAB — BASIC METABOLIC PANEL
CO2: 28 mEq/L (ref 19–32)
Chloride: 101 mEq/L (ref 96–112)
Creatinine, Ser: 1.23 mg/dL (ref 0.4–1.5)
GFR calc Af Amer: >60 mL/min (ref >60)
Glucose, Bld: 101 mg/dL — ABNORMAL HIGH (ref 70–99)

## 2011-04-06 LAB — CBC
HCT: 44.7 % (ref 39.0–52.0)
Hemoglobin: 14.6 g/dL (ref 13.0–17.0)
MCH: 28.9 pg (ref 26.0–34.0)
MCHC: 32.7 g/dL (ref 30.0–36.0)
MCV: 88.3 fL (ref 78.0–100.0)
RBC: 5.06 MIL/uL (ref 4.22–5.81)

## 2011-04-06 LAB — SURGICAL PCR SCREEN: MRSA, PCR: NEGATIVE

## 2011-04-10 NOTE — Assessment & Plan Note (Signed)
[facility]                            CARDIOLOGY OFFICE NOTE   NAME:[name] [name], [name] [name]                    MRN:          [id number]  DATE:01/26/2009                            DOB:          05-17-1931    [name] is a pleasant gentleman with coronary artery disease.  He  has had previous coronary bypassing graft.  His last Myoview in January  2009 showed an ejection fraction of 64% with soft tissue attenuation,  but no ischemia.  He also has cerebrovascular disease.  Since I last saw  him, he is doing well.  He denies any increased dyspnea, chest pain,  palpitations, or syncope.  There is no pedal edema.   PRESENT MEDICATIONS:  1. Vitamin D.  2. Vitamin C.  3. Prednisone.  4. Calcium.  5. Lopressor 50 mg p.o. b.i.d.  6. Norvasc 10 mg p.o. daily.  7. Plavix 75 mg p.o. daily.  8. Vasotec 10 mg p.o. daily.  9. Aspirin 325 mg p.o. daily.  10.Hydrochlorothiazide 25 mg p.o. daily.  11.Red yeast rice.  12.Fish oil.   PHYSICAL EXAMINATION:  VITAL SIGNS:  Blood pressure of 153/76.  His  pulse is 64.  HEENT:  Normal.  NECK:  Supple.  CHEST:  Clear.  CARDIOVASCULAR:  Regular rhythm.  ABDOMEN:  No tenderness.  EXTREMITIES:  No edema.   DIAGNOSES:  1. Coronary artery disease, status post coronary bypassing graft - Mr.      [name] [name] is doing well from symptomatic standpoint.  His last      Myoview showed no ischemia.  We will continue with medical therapy      including his aspirin.  I would discontinue his Plavix.  He will      continue on his beta-blocker, ACE inhibitor, and he has an      intolerance to STATINS.  We discussed the importance of diet and      exercise.  We will most likely repeat his stress test in 1 year.  2. Cerebrovascular disease - he will need followup carotid Dopplers in      January 2011.  He will continue on his aspirin.  3. Hypertension - his blood pressure is mildly elevated today.      However, he states it runs in  the 110-120 range at home.  We will      track this and adjust his regimen as indicated.  4. Hyperlipidemia - he will continue on his red yeast rice and fish      oil.  He is unwilling to try statins, Zetia, or niacin.  5. History of carotid endarterectomy.  6. History of transient ischemic attack.  7. STATIN allergy.  8. CONTRAST allergy.  9. History of mild renal insufficiency - Dr. Tresa Endo is following his      renal function.   We will see him back in 6 months.     Madolyn Frieze [name], MD, [facility]  Electronically Signed    BSC/MedQ  DD: 01/26/2009  DT: 01/27/2009  Job #: [id number]   cc:   Dr. Olivia Canter, Kathryne Sharper

## 2011-04-10 NOTE — Letter (Signed)
Apr 24, 2007    Dr. Olivia [name]  [address]  [place], [place] [address]   RE:  [name] [name], [name] [name]  MRN:  [id number]  /  DOB:  1931/01/13   Dear Dr. Tresa [name]:   It was a pleasure to see your nice patient, [name] [name], for followup  on Apr 24, 2007.  As you know, he has a long history of coronary artery  disease with a recent myocardial infarction and subsequent coronary  artery bypass graft surgery in 1986, with a redo coronary artery bypass  graft surgery in 1996, at which time he had the right internal mammary  artery anastomosed to the right coronary artery, saphenous vein graft to  the obtuse marginal, a saphenous vein graft to the anterior marginal  branch of the right coronary artery.  He had a previous left internal  mammary artery placed to the left anterior descending.   The patient also had a right carotid endarterectomy in September 2005,  with Dr. Balinda [name].  A stress test in November 2006, revealed no  ischemia.  In June 2007, he had stenting of his original vein graft to  the OM-I and OM-II with the placement of a 3.5 mm x 24 mm Liberte bare  metal stent.  The patient has done quite well since that time with no  cardiac symptoms.  He is generally feeling quite well.   CURRENT MEDICATIONS:  1. He is presently on Lopressor 50 mg b.i.d.  2. Norvasc 10 mg.  3. Plavix 75 mg.  4. Red yeast rice.  5. Fish oil.  6. Vasotec 10 mg.  7. Aspirin 325 mg.  8. Hydrochlorothiazide 25 mg daily.   His most recent carotid studies revealed 0%-39% carotid artery stenosis,  post-endarterectomy and 40%-59% LICA stenosis.   He has not had an abdominal ultrasound, in that at the time of the  catheterization in February 2001, he had an irregular aorta.   PHYSICAL EXAMINATION:  VITAL SIGNS:  Blood pressure 134/72, pulse 54,  sinus bradycardia,  GENERAL APPEARANCE:  Normal.  NECK:  JVP is not elevated.  Carotid pulses palpable and equal, without  significant  bruit.  LUNGS:  Clear.  CARDIAC:  Reveals no murmur or gallop.  ABDOMEN:  Unremarkable.  EXTREMITIES:  Trace edema, right lower extremity.   IMPRESSION:  Diagnoses as above.  The patient is quite stable.   PLAN:  We plan to recheck his beta natriuretic peptide and lipids and  have him return for an abdominal ultrasound.  Thereafter I will have him  follow up in our [facility] with Dr. Madolyn [name]. [name] [name].  I  should note that he continued to have sinus bradycardia, nonspecific  inter-ventricular conduction delay.   Thank you for the opportunity of sharing this nice gentleman's care.  I  am pleased that He is continuing to get along so well.    Sincerely,      E. [name] Congress, MD, [name] [name]  Electronically Signed    EJL/MedQ  DD: 04/24/2007  DT: 04/24/2007  Job #: [id number]

## 2011-04-10 NOTE — Assessment & Plan Note (Signed)
[facility]                            CARDIOLOGY OFFICE NOTE   NAME:[name] [name], [name] [name]                    MRN:          [id number]  DATE:01/07/2008                            DOB:          1931/10/22    Mr. Slayton is a very pleasant gentleman who has a history of coronary  disease. When I last saw him on December 02, 2007, he had been having some  left upper extremity pain and was concerned that it maybe coronary  disease.  We scheduled him to have a Myoview which was performed on  December 12, 2007.  His ejection fraction was 64% and there was soft  tissue attenuation but there was no ischemia.  We also scheduled him to  have follow-up carotid Dopplers.  He had 0 to 39% right and a 40-59%  left internal carotid artery stenosis.  Since then he has had no further  symptoms of left upper extremity pain.  He also has not had chest pain,  shortness of breath or pedal edema.  He has had a recent URI.   MEDICATIONS:  1. Lopressor 50 mg p.o. b.i.d.  2. Norvasc 10 mg p.o. daily.  3. Plavix 75 mg p.o. daily.  4. Vasotec 10 mg p.o. daily.  5. Aspirin 325 mg p.o. daily.  6. Hydrochlorothiazide 25 mg p.o. daily.  7. Red yeast rice.  8. Fish oil.  9. Flomax.   PHYSICAL EXAM:  Blood pressure 136/70, his pulse is 60.  He weighs 190  pounds.  HEENT:  Normal.  NECK:  Supple with no bruits.  CHEST:  Clear.  CARDIOVASCULAR:  Regular rate and rhythm.  ABDOMEN:  No tenderness.  EXTREMITIES:  No edema.   DIAGNOSIS:  1. Coronary artery disease status post coronary artery bypass and      graft - his Myoview showed no ischemia or infarction and preserved      left ventricular function.  He has had no further symptoms.  We      will continue medical therapy including his beta blocker, aspirin,      Plavix, ACE inhibitor and Norvasc.  He has not tolerated statins in      the past.  2. Hypertension - his blood pressure is adequately controlled on his      present  medications.  3. Hyperlipidemia - as above he has not tolerated statins, Zetia or      niacin.  He will continue his fish oil and red yeast rice.  4. History of carotid endarterectomy - he will need follow-up carotid      Dopplers in 1 year.  5. History of TIA.  6. Remote history of carbon monoxide poisoning.  7. STATIN allergy.  8. History of CONTRAST allergy.  9. History of nephrolithiasis.   He will see me back in 6 months.     Madolyn Frieze [name], MD, [facility]  Electronically Signed    BSC/MedQ  DD: 01/07/2008  DT: 01/08/2008  Job #: [id number]   cc:   Daryel November, MD

## 2011-04-10 NOTE — Assessment & Plan Note (Signed)
[facility]                            CARDIOLOGY OFFICE NOTE   NAME:[name] [name], [name] [name]                    MRN:          [id number]  DATE:12/02/2007                            DOB:          December 24, 1930    [name] [name] is a pleasant gentleman initially saw in August.  He has a  long cardiac history and has previously been followed by Dr. Glennon Hamilton.  Please refer my previous notes for details.  Since I last saw  him he denies any dyspnea, chest pain, palpitations or syncope.  However, yesterday morning be the patient awoke and the lower part is of  his left upper extremity had some pain and numbness in it.  Of note, he  does state that he sleeps on his arms and occasionally does fill  numbness but this appeared to be more prolonged.  Because of symptoms,  he discussed this with his wife and wanted to be seen today.  Of note,  he has not had any weakness in his extremities nor loss of sensation.  There is also no chest pain.   MEDICATIONS:  1. Lopressor 50 mg p.o. b.i.d.  2. Norvasc 10 mg daily.  3. Plavix 75 mg daily.  4. Vasotec 10 mg daily.  5. Aspirin 325 mg daily.  6. HCTZ 25 mg daily.  7. Red yeast rice.  8. Fish oil.  9. Flomax.   PHYSICAL EXAM:  Today shows a blood pressure of 147/70.  His pulse is  59.  Weighs 193 pounds.  HEENT:  Normal.  His neck is supple.  There are no bruits noted.  CHEST:  Clear.  CARDIOVASCULAR:  Regular rate and rhythm.  ABDOMEN:  Exam shows no tenderness.  EXTREMITIES:  Show no edema.   Electrocardiogram shows sinus rhythm at a rate of 59.  The axis is  normal.  There are no significant ST changes.   DIAGNOSES:  1. Left upper extremity pain - this appears to be potentially with him      sleeping on his arm.  However, they were concerned about this      appear to be more prolonged.  He is also due for a Myoview.  Will      plan to proceed with this for a stratification.  Shows no ischemia,      then we  will continue medical therapy.  2. Coronary disease status post coronary bypass graft - he will      continue on his beta blocker, Plavix, aspirin, ACE inhibitor and      Norvasc.  He has not tolerate statins in the past by his report.  3. Hypertension - we will follow his blood pressure adjust his regimen      as indicated.  For now we will continue his present medications.  4. hyperlipidemia - he has not tolerated statins, Zetia, or niacin in      the past by his report.  He will continue his fish oil and red      yeast rice.  We will check lipids and liver root pressures Myoview.  5. History  of carotid endarterectomy - we will plan to proceed his      carotid Dopplers now.  Particularly in light of his left upper      extremity numbness.  6. History of transient ischemic attack.  7. Remote history of carbon monoxide poisoning.  8. History of nephrolithiasis.  9. Question history of a contrast allergy.  10.STATIN allergy.   I will see him back in July 3 schedule.     Madolyn Frieze [name], MD, [facility]  Electronically Signed    BSC/MedQ  DD: 12/02/2007  DT: 12/02/2007  Job #: [id number]   cc:   Dr. Olivia Canter

## 2011-04-10 NOTE — Assessment & Plan Note (Signed)
[facility]                            CARDIOLOGY OFFICE NOTE   NAME:[name], [name] GROVE                    MRN:          [id number]  DATE:07/09/2007                            DOB:          Feb 02, 1931    [name] [name] is a very pleasant gentleman who has previously been  followed by Dr. Glennon [name]. He has a history of initial myocardial  infarction and coronary artery bypass grafting in 1986 and he had a redo  coronary artery bypass grafting in 1996. In June of 2007 the patient  developed recurrent exertional chest discomfort. His overall LV function  was normal. His LAD was totally occluded. The LIMA to the LAD was widely  patent and there was a 40% stenosis at the insertion into the LAD. The  circumflex was totalled and the saphenous vein graft to the circumflex  had a 95% stenosis of the proximal portion. The right coronary artery  was occluded and the RIMA to the right coronary artery was intact. The  patient had a bare metal stent at that time to the saphenous vein graft  to the circumflex. Note he had an abdominal ultrasound in June of 2008  that showed no aneurysm. He has also had a history of a carotid  endarterectomy and his most recent carotid Doppler showed a 40% to 59%  left internal carotid artery stenosis and a 0% to 39% right. This was in  March of this year. Since he was last seen he denies any dyspnea on  exertion, orthopnea, PND, pedal edema, palpitations, pre syncope, or  syncope, or chest pain. His medications include;  1. Lopressor 50 mg p.o. b.i.d.  2. Norvasc 10 mg p.o. daily.  3. Plavix 75 mg p.o. daily.  4. Fish oil 2400 daily.  5. Vasotec 10 mg p.o. daily.  6. Aspirin 325 mg p.o. daily.  7. Hydrochlorothiazide 25 mg p.o. daily.  8. Red Yeast Rice.  9. TripleFlex.   PHYSICAL EXAMINATION:  Today shows a blood pressure of 140/70. His pulse  is 62. He weighs 194 pounds.  HEENT: Normal.  NECK: Supple with no bruits.  CHEST:  Clear.  CARDIOVASCULAR EXAM:  Reveals a regular rate and rhythm.  ABDOMINAL EXAM: Benign.  EXTREMITIES: Show no edema.   DIAGNOSES:  1. Coronary artery disease status post coronary artery bypass graft-      the patient has had no chest pain or shortness of breath. We will      continue with medical therapy including his beta blocker, aspirin,      Plavix, and ACE inhibitor. NOTE HE HAS NOT TOLERATED STATINS IN THE      PAST BY HIS REPORT.  2. Hypertension- his blood pressure is well controlled on his present      medications.  3. Hyperlipidemia- we will continue with his fish oil and Red Yeast      Rice. HE APPARENTLY HAS BEEN UNABLE TO TOLERATE ANY STAINS, ZETIA,      OR NIACIN IN THE PAST.  4. History of carotid endarterectomy- he will need follow up carotid      Dopplers  in March of 2009.  5. History of TIA.  6. Remote history of carbon monoxide poisoning.  7. History of nephrolithiasis.  8. QUESTION HISTORY OF CONTRAST ALLERGY.  9. STATIN ALLERGY.   We will continue with risk factor modification and I will see him back  in 6 months.     [name] [name] [name] Som, MD, [facility]  Electronically Signed    BSC/MedQ  DD: 07/09/2007  DT: 07/10/2007  Job #: [id number]   cc:   [name] Canter, MD

## 2011-04-10 NOTE — Assessment & Plan Note (Signed)
[facility]                            CARDIOLOGY OFFICE NOTE   NAME:[name] [name], [name] [name]                    MRN:          [id number]  DATE:08/18/2008                            DOB:          June 22, 1931    [name] [name] is a pleasant gentleman with a history of coronary disease.  His last Myoview was performed in January 2009.  At that time, he was  found to have an ejection fraction of 64%.  There was probable mild soft  tissue attenuation but no ischemia.  He also has cerebrovascular  disease.  His last carotid Dopplers were performed on December 12, 2007.  There was a 0-39% right and then 40-59% left internal carotid artery  stenosis.  Followup was recommended in 1 year.  The patient recently  developed gout in his right foot.  He was given  as well as indomethacin.  However, apparently he had a reaction to  Uloric.  He states that ever since he took that pill he has had pain in  both of his upper extremities and shoulders.  It has been essentially  continuous.  Heat does improve the pain somewhat, and it has mildly  improved since discontinuing the medication.  It has been continuous for  several weeks.  He did contact the office, and apparently the patient  had an ultrasound of the left upper extremity to exclude DVT and this  apparently is negative, but I do not have those results available.  He  otherwise does not have exertional chest pain or shortness of breath.  There is no increased pedal edema or syncope.   His medications include Lopressor 50 mg p.o. b.i.d., Norvasc 10 mg p.o.  daily, Plavix 75 mg p.o. daily, Vasotec 10 mg p.o. daily, aspirin 325 mg  p.o. daily, hydrochlorothiazide 25 mg p.o. daily, red yeast rice, fish  oil, Flomax.  His Uloric has been stopped.  Vitamin D and indomethacin.   PHYSICAL EXAMINATION:  VITAL SIGNS:  Today shows a blood pressure of  133/65 and his pulse is 64.  Weighs 197 pounds.  HEENT:  Normal.  NECK:   Supple.  CHEST:  Clear.  CARDIOVASCULAR:  Regular rate and rhythm.  ABDOMEN:  Shows no tenderness.  EXTREMITIES:  Trace edema.  I cannot palpate any cords in his upper  extremities bilaterally.   His electrocardiogram shows a sinus rhythm at a rate of 64.  The axis is  normal.  There are no significant ST changes noted.   DIAGNOSES:  1. Coronary artery disease status post coronary bypassing graft - his      recent Myoview in January showed no ischemia or infarction and      normal left ventricular function.  He has not had chest pain or      shortness of breath.  We will continue his present medications to      include beta blockade, aspirin, Plavix, ACE inhibitor, and Norvasc.      Note, he has not tolerated statins in the past.  2. Hypertension - his blood pressure is adequately controlled on his  present medications.  3. Hyperlipidemia - he will continue on his fish oil and red yeast      rice.  He is unwilling to try statins, Zetia, or niacin.  4. History of carotid endarterectomy - he will need followup carotid      Dopplers in January.  5. History of transient ischemic attack.  6. Remote history of carbon monoxide poisoning.  7. STATIN allergy.  8. CONTRAST allergy.  9. History of nephrolithiasis.  10.History of mild renal insufficiency - I have instructed him not to      take the indomethacin as this may worsen his renal function.  I      have also asked him to follow up with Dr. Tresa Endo concerning his      upper extremity discomfort related to his recent medication.  We      will see him back in 6 months.     Madolyn Frieze [name], MD, [facility]  Electronically Signed    BSC/MedQ  DD: 08/18/2008  DT: 08/19/2008  Job #: [id number]   cc:   Olivia Canter, MD

## 2011-04-12 ENCOUNTER — Ambulatory Visit (HOSPITAL_COMMUNITY)
Admission: RE | Admit: 2011-04-12 | Discharge: 2011-04-13 | Disposition: A | Discharge status: Home / Self Care | Payer: Medicare Other | Source: Ambulatory Visit | Attending: Urology | Admitting: Urology

## 2011-04-12 ENCOUNTER — Other Ambulatory Visit: Payer: Self-pay | Admitting: Urology

## 2011-04-12 DIAGNOSIS — R300 Dysuria: Secondary | ICD-10-CM | POA: Insufficient documentation

## 2011-04-12 DIAGNOSIS — Z951 Presence of aortocoronary bypass graft: Secondary | ICD-10-CM | POA: Insufficient documentation

## 2011-04-12 DIAGNOSIS — I2510 Atherosclerotic heart disease of native coronary artery without angina pectoris: Secondary | ICD-10-CM | POA: Insufficient documentation

## 2011-04-12 DIAGNOSIS — I10 Essential (primary) hypertension: Secondary | ICD-10-CM | POA: Insufficient documentation

## 2011-04-12 DIAGNOSIS — Z79899 Other long term (current) drug therapy: Secondary | ICD-10-CM | POA: Insufficient documentation

## 2011-04-12 DIAGNOSIS — C679 Malignant neoplasm of bladder, unspecified: Secondary | ICD-10-CM | POA: Insufficient documentation

## 2011-04-12 DIAGNOSIS — K219 Gastro-esophageal reflux disease without esophagitis: Secondary | ICD-10-CM | POA: Insufficient documentation

## 2011-04-13 NOTE — Consult Note (Signed)
NAME:  [name] [name], [name] [name]                       ACCOUNT NO.:  [id number]   MEDICAL RECORD NO.:  [id number]                   PATIENT TYPE:  INP   LOCATION:  NA                                   FACILITY:  [facility]   PHYSICIAN:  Mark E. Karin Golden, M.D.                DATE OF BIRTH:  [birth year]   DATE OF CONSULTATION:  07/20/2004  DATE OF DISCHARGE:                                   CONSULTATION   REASON FOR CONSULTATION:  Consultation for interpretation of intracranial  views from a cerebral arteriogram done July 20, 2004, by Dr. Madilyn Fireman.   HISTORY:  Severe right internal carotid artery stenosis by Doppler.   RIGHT INTERNAL CAROTID ARTERIOGRAM:  This vessel is opacified via a common  carotid injection.  There is mild atherosclerotic irregularity of the  carotid siphon and supraclinoid internal carotid artery, but I do not think  there is a flow-limiting stenosis.  Flow from this injection supplies the  right middle and anterior cerebellar artery territories.  There is no sign  of aneurysm, stenosis or vascular malformation.  Venous and parenchymal  phases are normal.   LEFT INTERNAL CAROTID ARTERIOGRAM:  This vessel is opacified via a common  carotid injection.  There is a stenosis of the supraclinoid ICA estimated at  50 to 60%.  Flow from this injection supplies the left middle and anterior  cerebral artery territory as well as giving some supply to the right  anterior cerebral artery territory through a patent anterior communicating  artery.  I see no evidence of aneurysm or vascular malformation.  The venous  and parenchymal phases are normal.   IMPRESSION:  Atherosclerotic disease of both internal carotid arteries in  the region of the carotid siphons and supraclinoid regions.  No stenosis is  seen on the right, but on the left, there is a 50-60% stenosis of the  internal carotid artery.                                               Mark E. Karin Golden, M.D.    MES/MEDQ  D:   07/25/2004  T:  07/26/2004  Job:  [id number]

## 2011-04-13 NOTE — H&P (Signed)
NAME:  [name] [name], [name] [name]                       ACCOUNT NO.:  [id number]   MEDICAL RECORD NO.:  [id number]                   PATIENT TYPE:  INP   LOCATION:  NA                                   FACILITY:  [facility]   PHYSICIAN:  Balinda Quails, M.D.                 DATE OF BIRTH:  1931-07-09   DATE OF ADMISSION:  DATE OF DISCHARGE:                                HISTORY & PHYSICAL   CHIEF COMPLAINT:  Blocked carotid artery.   HISTORY OF PRESENT ILLNESS:  The patient is a 77 year old white male who was  referred to Dr. Balinda Quails by Dr. Cecil Cranker for evaluation of a  right internal carotid artery stenosis.  The patient has a history of  coronary artery disease and had recently returned for follow up with Dr. Cecil Cranker and was found on physical exam to have an asymptomatic new  right carotid bruit.  Doppler studies performed in their office showed  increased velocities on the right at the carotid bifurcation as well as  proximal right 80-99% stenosis with mild disease at the left bifurcation.  The patient has had a remote history of a TIA in the past but has had no  symptoms neurologically since that time.  He was referred to Dr. Balinda Quails and was seen here in the office.   Duplex findings, performed here in our office, confirmed the findings from  the [facility] office.  It was Dr. Balinda Quails' recommendation that he  undergo angiography to further delineate his carotid anatomy.  This was  recently performed and he was found to have a severe greater than 90%  stenosis of the right internal carotid artery with no significant stenosis  on the left.  According to the patient, he remains asymptomatic from  neurologic standpoint specifically denying any visual changes, TIA symptoms,  amaurosis fugax, dysphagia, syncope, presyncope, speech impairment, or  weakness.  It was Dr. Balinda Quails' opinion that he should undergo right  carotid endarterectomy at this time in  order to decrease his risk of stroke.   PAST MEDICAL HISTORY:  1.  Coronary artery disease.  2.  Hypertension.  3.  Hyperlipidemia.  4.  History of kidney stones.  5.  History of TIA around five years ago.  6.  History of carbon monoxide poisoning in the past requiring treatment in      hyperbaric chamber at [facility]  7.  Arthritis.  8.  Recent urinary tract infection.  He just finished a course of Cipro on      July 31, 2004.   PAST SURGICAL HISTORY:  1.  Coronary artery bypass grafting in 1988 by Dr. Delsa Grana. Wilson.  2.  Redo CABG in 1996 by Dr. Delsa Grana. Wilson.  3.  Status post multiple PTCA and stent placements.  4.  Tonsillectomy as a child.  5.  Bilateral MMTs  as an adult.   CURRENT MEDICATIONS:  1.  Lopressor 25 mg b.i.d.  2.  Hydrochlorothiazide 25 mg q.d.  3.  Norvasc 5 mg q.d.  4.  Plavix 75 mg q.d. (last taken on July 27, 2004).  5.  Vasotec 10 mg q.h.s.  6.  Enteric-coated aspirin 325 mg q.d.  7.  Red yeast rice 600 mg b.i.d.  8.  Fish oil caps 1200 mg b.i.d.  9.  Benadryl OTC 1 q.h.s.   ALLERGIES:  He has a questionable allergy to IV DYE which has occurred in  the past.  He usually states that they premedicate prior to any IV studies.   FAMILY HISTORY:  His mother died at age 75 with myocardial infarction and  congestive heart failure.  His father died at age 67 of myocardial  infarction and he also had hypertension.  He had one uncle who died of  congestive heart failure.  One sister is alive with coronary artery disease  who is status post CABG and a carotid endarterectomy.  He has one brother  who is living and had a CVA.   SOCIAL HISTORY:  He is married and has two children.  He is a retired Engineer, maintenance.  He denies any alcohol use.  He reports having smoked one to one  and a half packs of cigarettes per day for 15 to 20 years.  He quit 30 years  ago.  He also chewed tobacco in the past but quit approximately 15 years  ago.   REVIEW OF  SYMPTOMS:  See history of present illness for pertinent positives  and negatives.  Also he wears glasses.  He has occasionally tingling  sensations in his face which only occur at night and resolve without  treatment.  He has a history of kidney stones.  He also has had a recent  urinary tract infection which was treated with Cipro.  He was febrile but  has had no symptoms since completing his course of antibiotics.  He has  chronic joint pain, especially in his knees, and has difficulty with  ambulation secondary to arthritis in his knees.  He denies weight loss, any  recent fevers or chills, neurologic symptoms as described above, chest pain,  palpitations, shortness of breath, dyspnea on exertion, orthopnea, paroxysm  nocturnal dyspnea, cough, wheezing, abdominal pain, nausea, vomiting,  diarrhea, constipation, reflux symptoms, hematochezia, melena, hematuria,  nocturia, lower extremity edema, claudication symptoms, rest pain,  nonhealing ulcers, anxiety, depression, intolerance to heat or cold.   PHYSICAL EXAMINATION:  VITAL SIGNS:  Blood pressure 128/70, pulse is 72 and  regular, respirations 16 and unlabored.  GENERAL:  This is a well-developed, well-nourished white male in no acute  distress.  HEENT:  Normocephalic and atraumatic.  Pupils are equal, round, and reactive  to light and accommodation.  Extraocular movements intact.  TMs and canals  are clear.  Nares patent bilaterally.  Oropharynx is clear with upper and  lower dentures in place.  NECK:  Supple without lymphadenopathy or thyromegaly.  He has a right soft  carotid bruit.  LUNGS:  Clear to auscultation.  HEART:  Regular rate and rhythm without murmurs, rubs, or gallops.  CHEST:  He has a well-healed median sternotomy scar.  ABDOMEN:  Soft, nontender, and nondistended with active bowel sounds in all  quadrants.  No masses or hepatosplenomegaly. EXTREMITIES:  No clubbing, cyanosis, or edema.  He has well healed  bilateral  saphenectomy scars which extend from ankle to midthigh.  He has 2+ palpable  femoral pulses and 1+ dorsalis pedis and posterior tibial pulses.  NEUROLOGICAL:  Cranial nerves II through XII grossly intact.  Gait is within  normal limit for age.  He is alert and oriented x 3.  Muscle strength and  motor are symmetric bilaterally.   ASSESSMENT/PLAN:  This is a 77 year old white male with a history of  asymptomatic right internal carotid artery stenosis.  He will be admitted to  [facility] on August 03, 2004 and undergo right carotid  endarterectomy performed by Dr. Balinda Quails.      Coral Ceo, P.A.                        Balinda Quails, M.D.    GC/MEDQ  D:  08/01/2004  T:  08/01/2004  Job:  [id number]

## 2011-04-13 NOTE — Discharge Summary (Signed)
[place]. [facility]  Patient:    [name] [name], [name] [name]                    MRN: [id number] Adm. Date:  [id number] Disc. Date: 06/06/00 Attending:  Alric Quan Dictator:   Abelino Derrick, P.A.C. [facility]                           Discharge Summary  DISCHARGE DIAGNOSES: 1. Unstable angina, catheterization this admission revealing 90% PL stenosis. 2. Known coronary disease, status post coronary artery bypass grafting in 1988    with re-do surgery in 1996. 3. Hyperlipidemia. 4. Hypertension. 5. Peripheral vascular disease with 70% right renal artery stenosis by    catheterization this admission.  HOSPITAL COURSE:  The patient is a 77 year old male followed by Dr. Corinda Gubler with a long history of coronary disease.  He had bypass surgery initially in 1998, with re-do in 1996.  At the time of his surgery in 1996 he had an RIMA to RCA, SVG to OM and circumflex and SVG to the anterior marginal branch.  He presented to the office on June 04, 2000, with a history of recurrent midsternal chest pain associated with some mild diaphoresis consistent with unstable angina.  The patient was admitted to telemetry and started on lovenox and beta blocker.  Catheterization was done June 05, 2000, which revealed normal left main, total native LAD, total native circumflex, and total native right coronary artery.  The old LIMA to LAD was patent, the new SVG to OM-1 and circumflex was patent, the SVG to acute marginal was not visualized, RIMA to RCA was patent with a 90% stenosis in the PL branch.  LV gram showed normal LV function.  It did have 70% right renal artery stenosis.  The patient is to be treated medically at this time.  He was transferred to the floor and has ambulated and is ready for discharge June 06, 2000.  DISCHARGE MEDICATIONS: 1. Norvasc 5 mg q. day. 2. Hydrochlorothiazide 25 mg q.d. 3. Coated aspirin q. day. 4. Plavix 1/2 tablet a day. 5. Vasotec 10 mg a  day. 6. Lopressor 25 mg twice a day.  LABORATORY DATA:  Renal function shows a sodium 136, potassium 3.3, BUN 18, creatinine 1.2, troponin is negative, INR 1.1, white count 8.1, hemoglobin 14.9, hematocrit 41.5, platelets count 226.  Chest x-ray showed mild chronic obstructive pulmonary disease with no infiltrate.  EKG reveals a sinus rhythm with Q waves in 2, 3, and F.  DISPOSITION:  The patient is discharged in stable condition and will follow up in our office in about 2 weeks. DD:  06/06/00 TD:  06/06/00 Job: [id number]

## 2011-04-13 NOTE — H&P (Signed)
NAME:  [name] [name], [name] [name]             ACCOUNT NO.:  [id number]   MEDICAL RECORD NO.:  [id number]          PATIENT TYPE:  INP   LOCATION:  1823                         FACILITY:  [facility]   PHYSICIAN:  Learta Codding, M.D. LHCDATE OF BIRTH:  17-Apr-1931   DATE OF ADMISSION:  10/11/2005  DATE OF DISCHARGE:                                HISTORY & PHYSICAL   CURRENT COMPLAINT:  Mild lightheadedness and nausea with left hand tingling  for approximately 30-45 minutes.   HISTORY OF PRESENT ILLNESS:  The patient is a 77 year old white male with a  history of known coronary artery disease, status post myocardial infarction  and coronary artery bypass grafting in 1998, followed by a re-do coronary  artery bypass grafting in 1996.  The patient in the remote past presented  with a myocardial infarction secondary to carbon monoxide poisoning.  At the  time, his presentation was one of severe substernal chest pain.  He was also  temporarily in a hyperbaric chamber at [facility].  He is followed by Dr. Glennon Hamilton in the office.  He has been doing well over the last several years.  He is a very active individual and rides a bicycle approximately 2 miles.  He was seen in June of 2005 and performed an adenosine Cardiolite study  which was within normal limits.   The patient now presents to the emergency room with a brief episode, albeit  of about 30-45 minutes of lightheadedness, nausea, and tingling in the palm  of the left hands and fingers.  The symptoms developed approximately 2 hours  after raking leaves, but occurred at rest while sitting in a chair.  He  denies any recent chest pain or chest pain on admission.  He also had no  presyncope or palpitation symptoms.  By the time the patient presented to  the emergency room, his symptoms had resolved.  A head CT scan was done, and  this was within normal limits.  The entire review of systems was otherwise  within normal limits.   ALLERGIES:  1.  IVP DYE  allergy.  2.  STATIN .  3.  NIACIN.  4.  ZETIA.   CURRENT MEDICATIONS:  1.  Norvasc 5 mg daily.  2.  Hydrochlorothiazide 25 mg daily.  3.  Aspirin 81 mg daily.  4.  Plavix half a tablet a day.  5.  Lopressor 25 mg p.o. b.i.d.  6.  Vasotec 10 mg p.o. nightly.  7.  Fish oil.  8.  Red yeast rice.  9.  Rolaids p.r.n.   PAST MEDICAL HISTORY:  1.  Myocardial infarction.  2.  Coronary artery bypass graft in 1988.  LIMA to the LAD and RIMA to the      RCA.  Saphenous vein graft to the circumflex, saphenous vein graft to an      acute marginal.  3.  Re-do CABG in 1996 with the above outlined anatomy.  4.  History of prior stent placement.  5.  History of carotid endarterectomy in September 2005.  Followup study      showed 0% right ICA and  40% left ICA .  6.  History of hypertension and dyslipidemia.  7.  TIA.  8.  History of carbon monoxide poisoning   SOCIAL HISTORY:  The patient lives in [place] with his wife.  He is a  retired Games developer.  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  He used to smoke.  He quit 40 years ago, and has a 10 pack year history.  He never drinks  alcohol.  He bicycles approximately 2 miles a day.   FAMILY HISTORY:  Father died at age 46 of a myocardial infarction.  He has  several siblings.  One sister had coronary bypass grafting, and 1 brother  had a cerebrovascular accident.   REVIEW OF SYSTEMS:  As outlined above.  It is only positive for GERD, but no  headache, sore throat.  No dyspnea on exertion, PND, frequency, or dysuria,  myalgia, arthralgia.  The remainder of the review of systems is negative.   PHYSICAL EXAMINATION:  VITAL SIGNS:  Blood pressure 122/68, heart rate 60  beats per minute, temperature 97.5, respirations 18.  GENERAL:  A 77 year old male in no acute distress.  HEENT:  Normocephalic and atraumatic.  Pupils equal, round and reactive to  light.  NECK:  No carotid bruits.  JVD is 5 cm.  HEART:  Regular rate and rhythm.  Normal S1  and S2.  LUNGS:  Clear breath sounds bilaterally.  No wheezing.  SKIN:  No rash or lesions.  ABDOMEN:  Soft, nontender.  No rebound, no guarding.  GU/RECTAL:  Deferred.  EXTREMITIES:  No clubbing, cyanosis, or edema.  Peripheral pulses are  palpable, 2+ bilaterally.  NEUROLOGIC:  The patient is alert and oriented.  Grossly nonfocal.   Chest x-ray revealed no acute changes, status post cranial Dopplers.  Head  CT negative for bleeds; notable for atrophy.  EKG revealed normal sinus  rhythm, heart rate 67 beats per minute.  Small Q waves in II, III, and aVF,  but no acute ischemic changes.   LABORATORY DATA:  Initial troponin was less than 0.05.  Hemoglobin was 15.6,  hematocrit 46.  BUN 25, creatinine 1.5 sodium 135, potassium 3.8, glucose  100.   PROBLEM LIST:  1.  Transient dizziness with left hand tingling.      1.  Rule out transient ischemic attack.      2.  Rule out arrhythmia (unlikely).      3.  Rule out acute coronary syndrome (unlikely).  2.  Coronary artery disease.      1.  Status post coronary bypass grafting and re-do in 1996.      2.  Normal adenosine Cardiolite study in June 2005 with an ejection          fraction of 55%.  3.  Cerebrovascular disease status post right carotid endarterectomy in      September 2005.  4.  Hypertension.  5.  Dyslipidemia.   PLAN:  1.  The patient's symptoms are atypical and unlikely related to acute      coronary syndrome; however, the patient will be admitted and ruled out      for myocardial infarction.  2.  If enzymes are negative, I do think the patient can be scheduled for      repeat outpatient Cardiolite study with follow up by Dr. Glennon Hamilton in      the office.  3.  If enzyme markers are positive, certainly consideration could be given      to catheterization.  4.  The patient may also require further follow up with neurology, and may     need to be considered for an MRI if he has recurrent symptoms.      Learta Codding, M.D. [facility]  Electronically Signed     GED/MEDQ  D:  10/11/2005  T:  10/11/2005  Job:  [id number]   cc:   Cecil Cranker, M.D.  [address]  [address]  [place]  [place] [address]

## 2011-04-13 NOTE — Cardiovascular Report (Signed)
NAME:  [name] [name], [name] [name]             ACCOUNT NO.:  [id number]   MEDICAL RECORD NO.:  [id number]          PATIENT TYPE:  OIB   LOCATION:  6525                         FACILITY:  [facility]   PHYSICIAN:  Arturo Morton. Riley Kill, M.D. [facility] OF BIRTH:  1931-09-20   DATE OF PROCEDURE:  05/15/2006  DATE OF DISCHARGE:                              CARDIAC CATHETERIZATION   INDICATIONS:  Mr. Kluver is a 77 year old gentleman followed by Dr.  Corinda Gubler.  The patient has had increase in symptoms compatible with coronary  ischemia.  He underwent diagnostic catheterization earlier today which  demonstrated a high-grade saphenous vein graft stenosis to the obtuse  marginal branches.  The mammary has remained patent.  Based on this, a  percutaneous intervention was recommended.  The patient is on chronic Plavix  therapy.  He takes 1/2 tablet of Plavix a day.  The current study was done  with plan for PCI.  The patient's creatinine is mildly elevated, and the  ventriculogram was not performed earlier today other than a brief hand  injection.  I discussed the options and risks with the patient and he was  agreeable to proceed.  The patient was fully informed as to the plans.   PROCEDURE:  Percutaneous stenting of the saphenous vein graft to the obtuse  marginals.   DESCRIPTION OF PROCEDURE:  The patient was brought to the catheterization  laboratory from the JV lab.  A guiding AL1 guiding catheter with side holes  was utilized in order to accommodate a filter wire.  The patient was  anticoagulated using bivalirudin and the ACT was checked and found to be  adequate for percutaneous intervention.  The patient had received 300 mg of  oral clopidogrel downstairs prior to the procedure.  The vessel was engaged.  A filter wire was passed across the lesion.  The filter wire was occlusive  and with removal of the covering sheath, flow was resumed.  He did have some  chest pain, however, as long as the filter wire was  down.  Predilatation was  done with a 3-mm balloon and then the vessel was stented using a 3.5 x 24  Liberty stent.  This was taken up to 14 atmospheres.  There was marked and  dramatic improvement in the appearance of the artery with excellent TIMI III  flow.  We were able to recover the filter wire without difficulty.  The AL1  was then removed.  Final angiographic views were obtained.  The patient was  taken to the holding area in satisfactory clinical condition.   ANGIOGRAPHIC DATA:  Saphenous vein graft to the OM and circumflex  sequentially demonstrates a 95% segmental stenosis just beyond the proximal  margin.  Following stenting with filter wire protection, the stenosis was  reduced from 95 to 0% with residual TIMI III flow.  There was an excellent  anatomic appearance.      Arturo Morton. Riley Kill, M.D. [facility]  Electronically Signed     TDS/MEDQ  D:  05/15/2006  T:  05/15/2006  Job:  [id number]   cc:   Olivia Canter, M.D.   Cecil Cranker,  M.D.  [id number] [place]  [place] [address]

## 2011-04-13 NOTE — Letter (Signed)
December 16, 2006    Olivia Canter, MD  [address]  [place], [place] [address]   RE:  [name] [name], [name] [name]  MRN:  [id number]  /  DOB:  [birth year]   Dear Dr. Tresa Endo:   It is a pleasure to see your nice patient, [name] [name], for followup on  December 16, 2006, more than 6 months post percutaneous intervention with  stenting of sequential vein grafts to the first and second marginal.  The patient tolerated the procedure well and has had no recurrent chest  pain.   As you know, he has a long history of coronary artery disease having had  myocardial infarction in 1986, at which time he had CABG and a redo CABG  in 1996, at which time the right internal mammary artery was anastomosed  to the RCA and the saphenous vein graft to the OM, saphenous vein graft  to the anterior marginal branch of the RCA.  Previous LIMA to LAD was  patent.  He also had a right carotid endarterectomy in September 2005,  followed by Dr. Madilyn Fireman.   The patient states he is unable to take a Statin.  He is on:  1. Lopressor 50.  2. Hydrochlorothiazide 25.  3. Norvasc 10.  4. Plavix 75.  5. Fish oil.  6. Vasotec 10.  7. Aspirin 325.   He has had recent episode of gout, uric acid 8.8.   PHYSICAL EXAM:  Blood pressure 162/77, although he states it was 123/68  before leaving home today.  I rechecked it and it seems to be in the  range of 160/80 in both arms.  CARDIAC:  Exam is unremarkable.  No murmur or gallop.  ABDOMEN:  Normal.  EXTREMITIES:  Normal.   EKG:  Reveals possible old inferior MI.   Because of the elevated uric acid, I suggested discontinuing the  hydrochlorothiazide.  However, he was concerned about the edema that he  has noted when he had tried to discontinue it in the past.  We will,  thus, decrease it to 12.5.   I suspect he may need some allopurinol in the future.   I will plan to see him again in 4 months or any other time you so  desire.  Thank you for the opportunity to share  in this nice patient's  care.    Sincerely,      E. Graceann Congress, MD, [facility]  Electronically Signed    EJL/MedQ  DD: 12/16/2006  DT: 12/16/2006  Job #: [id number]

## 2011-04-13 NOTE — Discharge Summary (Signed)
NAME:  [name] [name], [name] [name]             ACCOUNT NO.:  [id number]   MEDICAL RECORD NO.:  [id number]          PATIENT TYPE:  INP   LOCATION:  3702                         FACILITY:  [facility]   PHYSICIAN:  Jesse Sans. Wall, M.D.   DATE OF BIRTH:  1931-07-11   DATE OF ADMISSION:  10/11/2005  DATE OF DISCHARGE:  10/12/2005                                 DISCHARGE SUMMARY   PROCEDURE:  None.   REASON FOR ADMISSION:  [name] is a 77 year old male with known  coronary artery disease, who presented to the emergency room with complaint  of transient dizziness associated with left hand tingling.   PRIMARY CARDIOLOGIST:  Dr. Sarajane Jews.   LABORATORY DATA:  Normal serial cardiac markers, renal function, liver  enzymes, and TSH. Potassium 3.5, 3.1 on admission. Glucose 157. Hemoglobin  A1C pending.   HOSPITAL COURSE:  Following initial presentation to the emergency room with  complaint of transient dizziness and associated left arm tingling of  approximately 45 minute duration, patient was referred for a CT scan of the  head, which showed no acute changes and no evidence of bleed. He was  admitted for further evaluation and monitoring to exclude possible  underlying arrhythmia as the etiology for symptoms.   The patient was kept overnight and continuous monitoring revealed frequent  PVCs with rare couplets, but no non-sustained ventricular tachycardia. The  patient had no recurrent symptoms of dizziness or left hand tingling.   Mild hypokalemia was noted and repleted prior to discharge. The patient will  need a followup BMET.   Final recommendations were to arrange for outpatient Adenosine Myoview prior  to scheduled visit with Dr. Sarajane Jews on October 23, 2005.   PRIMARY DIAGNOSES:  1.  Transient dizziness/associated left hand tingling.      1.  Negative head CT scan.      2.  No evidence of non-sustained ventricular tachycardia by telemetry.   SECONDARY DIAGNOSES:  1.  Coronary  artery disease.      1.  Status post coronary artery bypass grafting in 1988; redo 3 vessel          coronary artery bypass grafting in 1996.      2.  Normal Adenosine Myoview; ejection fraction 55% June 2005.  2.  Hypokalemia.  3.  Hypertension.  4.  Hyperglycemia.  5.  Status post transient ischemic attack in 2000.  6.  Hyperlipidemia.  7.  History of carbon monoxide poisoning.  8.  History of nephrolithiasis.  9.  QUESTION CONTRAST DYE ALLERGY.  10. STATIN INTOLERANCE.   DISCHARGE MEDICATIONS:  1.  Imdur 15 mg daily (new).  2.  Norvasc 5 mg daily.  3.  Hydrochlorothiazide 25 mg daily.  4.  Aspirin 81 mg daily.  5.  Plavix 37.5 mg daily.  6.  Lopressor 25 mg b.i.d.  7.  Vasotec 10 mg q.h.s.  8.  Fish oil 1200 mg daily.  9.  Red yeast rice 600 mg daily.   INSTRUCTIONS:  1.  The patient is scheduled for followup Adenosine Myoview on October 17, 2005 at 9 a.m.  2.  Followup metabolic profile in 1 week for monitoring of hypokalemia.  3.  Keep previously scheduled followup appointment with Dr. Sarajane Jews on      October 23, 2005.   DISCHARGE DURATION:  32 minutes.      Gene Serpe, P.A. [facility]      Siyon C. Wall, M.D.  Electronically Signed    GS/MEDQ  D:  10/12/2005  T:  10/12/2005  Job:  [id number]   cc:   Olivia Canter, M.D.  [place], [place]

## 2011-04-13 NOTE — Discharge Summary (Signed)
NAME:  [name] [name], [name] [name]                       ACCOUNT NO.:  [id number]   MEDICAL RECORD NO.:  [id number]                   PATIENT TYPE:  INP   LOCATION:  3307                                 FACILITY:  [facility]   PHYSICIAN:  Balinda Quails, M.D.                 DATE OF BIRTH:  February 26, 1931   DATE OF ADMISSION:  08/03/2004  DATE OF DISCHARGE:  08/04/2004                                 DISCHARGE SUMMARY   ADMITTING DIAGNOSIS:  Severe right internal carotid artery stenosis.   PAST MEDICAL HISTORY:  1.  Coronary artery disease, status post CABG in 1988, by Dr. Andrey Campanile.  Redo      CABG in 1996 by Dr. Andrey Campanile, also status post PTCA and stents.  2.  Hypertension.  3.  Hyperlipidemia.  4.  Recent UTI, completed a course of Cipro prior to admission.  5.  History of kidney stones.  6.  History of TIA, approximately five years ago.  7.  History of carbon monoxide poisoning in the past.  8.  Arthritis.   OTHER SURGICAL HISTORY:  Tonsillectomy.   DISCHARGE DIAGNOSIS:  Severe right internal carotid artery stenosis, status  post carotid endarterectomy.   BRIEF HISTORY:  [name] [name] is a 77 year old Caucasian man.  Recently found  on physical exam was a right carotid bruit.  Doppler exam showed increased  velocities at the right carotid bifurcation and proximal internal carotid  artery with an 80-99% stenosis with only mild disease at the left  bifurcation.  No recent symptoms.  He also underwent angiogram which  revealed severe greater than 90% right internal carotid artery stenosis.  He  was evaluated at the office by Dr. Madilyn Fireman who recommended proceeding with an  elective right carotid endarterectomy for stroke risk reduction.  The  procedure risks and benefits were all discussed with Mr. Cudworth.  He  agreed to proceed with surgery.   HOSPITAL COURSE:  On August 03, 2004, [name] [name] was electively admitted  to [facility] in the care of Dr. Balinda Quails.  He underwent the  following surgical procedure, a right carotid endarterectomy with Dacron  patch angioplasty.  He tolerated this procedure well, transferring in stable  condition to the PACU.  He was extubated immediately following surgery,  awoke from anesthesia neurologically intact.  On morning rounds, August 04, 2004, his postoperative day one, Mr.  Loeffelholz reported feeling fairly well.  Vital signs were stable 125/50.  He  was afebrile.  Oxygen saturation 100% on 2 liters nasal cannula.  His heart  was maintaining a normal sinus rhythm.  Lungs have a few rhonchi on the  left.  He was complaining of some indigestion.  Abdominal exam was benign.  A Foley recently removed.  Voiding small amounts at this time.  His right  neck incision is clean and dry.  There is no swelling.  He remains  neurologically intact.  He is  ambulating independently in the hall.  Mr.  Weathington is stable overall.  He will be observed till this afternoon.  If he  voids further without any difficulty, he will be discharged home.  Please  note that a urinalysis and culture were sent today as followup of his UTI  several weeks ago.  The results can be reviewed as an outpatient.   LABORATORY STUDIES:  August 03, 2004:  CBC, white blood cells 11.7,  hemoglobin 11.7, hematocrit 34.2, platelets 284.  Chemistries include a  sodium of 137, potassium 3.9, BUN 17, creatinine 1.2, glucose 149.   CONDITION ON DISCHARGE:  Improved.   INSTRUCTIONS ON DISCHARGE:  Medications, he is to resume his home medicines  of:  1.  Lopressor 25 mg b.i.d.  2.  HCTZ 25 mg every day.  3.  Norvasc 5 mg every day.  4.  Plavix 75 mg every day.  5.  Vasotec 10 mg every day.  6.  Aspirin one every day.  7.  Benadryl p.r.n.  For pain management, he may have Tylox 1-2 p.o. q.4h. p.r.n.   ACTIVITY:  1.  He is asked to refrain from any driving or any heavy lifting for the      next two weeks.  2.  He was also instructed to continue his breathing exercises  and daily      walking.   DIET:  Should continue to be a low-fat, low-salt diet.   He is to clean his incision daily with mild soap and water.  He may begin  showering Sunday, August 06, 2004.  If his incision shows any signs of  infection, he is to call Dr. Madilyn Fireman' office.   FOLLOW UP:  Follow up with Dr. Madilyn Fireman in the [facility] office on August 28, 2004  at 1:30 in the afternoon.      Toribio Harbour, N.P.                  Balinda Quails, M.D.    CTK/MEDQ  D:  08/04/2004  T:  08/05/2004  Job:  [id number]   cc:   patient's hospital chart   Balinda Quails, M.D.  [address]  [place]  [place] [address]   E. Graceann Congress, M.D.   Landry Dyke, Dr.  Kathryne Sharper,

## 2011-04-13 NOTE — Letter (Signed)
June 11, 2006      Dr. Olivia Canter  [address]  [place], [place] [address].   RE:  [name] [name], [name] [name]  MRN:  [id number]  /  DOB:  [birth year]   Dear Dr. Tresa Endo:   I was asked to see this nice patient, [name] [name] for follow-up on June 11, 2006, four weeks post percutaneous intervention  with stenting of sequential  vein graft to OM-1 and OM-2 with placement of 3.5 x 24 mm Liberte bare metal  stent.  Patient tolerated the procedure well.  He has had no recurrent chest  pain.  Patient was having symptoms of unstable angina prior to admission.   MEDICATIONS:  1.  Hydrochlorothiazide 25.  2.  Plavix 75.  3.  Fish oil.  4.  Vasotec 10.  5.  Benadryl.  6.  Norvasc 10.  7.  Lopressor 50 b.i.d.  8.  Aspirin 325.   Patient has been unable to tolerate STATIN, NIACIN or ZETIA.   Recent LDL was 90.   PHYSICAL EXAMINATION:  GENERAL APPEARANCE:  Normal.  VITAL SIGNS:  Blood pressure 133/68, pulse 54, sinus bradycardia.  NECK:  JVP is not elevated. Carotid pulses are palpable without  bruits.  LUNGS:  Clear.  CARDIOVASCULAR:  Normal.  EXTREMITIES:  Normal.   IMPRESSION:  Diagnoses as above and as noted on ER report of May 14, 2006.   I suggest patient continue on the same therapy.  See him back in four months  or p.r.n.  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   Thank you for the opportunity to share in this nice gentleman's care.    Sincerely,      E. Graceann Congress, MD, [facility]   EJL/MedQ  DD:  06/11/2006  DT:  06/11/2006  Job #:  [id number]

## 2011-04-13 NOTE — Letter (Signed)
September 24, 2006     Dr. Olivia Canter  [address]  [place], [place] [address]   RE:  [name] [name], [name] [name]  MRN:  [id number]  /  DOB:  June 29, 1931   Dear Dr. Tresa Endo:   It was a pleasure to see your nice patient, [name] [name], for followup on  September 24, 2006, 4 months post percutaneous intervention with stenting of  the sequential vein graft of the first and second marginals.  The patient  tolerated the procedure well, has had no recurrent chest pain.  He recently  has had a severe episode of gout, which was relieved with colchicine, which  he now takes 0.6 mg daily.   He is also on:  1. Aspirin 325 mg.  2. Lopressor 50 mg b.i.d.  3. Norvasc 10 mg.  4. Benadryl.  5. Vasotec 10 mg.  6. Plavix 75 mg.  7. Hydrochlorothiazide 25 mg.   He has occasional ankle swelling, but no shortness of breath, chest pain or  palpitations.   PHYSICAL EXAMINATION:  Blood pressure 122/76, pulse 65, normal sinus rhythm.  GENERAL APPEARANCE:  Normal.  JVP is not elevated.  Carotid pulses are palpable equally without bruits.  He has had right carotid endarterectomy in the past.  LUNGS:  Clear.  CARDIAC:  Normal, no murmur, rub or gallop.  ABDOMEN:  Exam unremarkable.  EXTREMITIES:  Normal.   EKG with normal sinus rhythm, nonspecific intraventricular conduction delay.   IMPRESSION:  Diagnoses as above.  In addition, he has had recent gout.   I suggested he discontinue hydrochlorothiazide.  We will follow his blood  pressures closely.  He will notify us if there is any increase in blood  pressure or significant peripheral edema.   I will plan to see him back in 3 months.  We plan to get a BMP and uric acid  today.  Thank you for the opportunity to share in this nice gentleman's  care.    Sincerely,     ______________________________  E. Graceann Congress, MD, [facility]    EJL/MedQ  DD: 09/24/2006  DT: 09/25/2006  Job #: [id number]

## 2011-04-13 NOTE — Discharge Summary (Signed)
NAME:  [name] [name], [name] [name]             ACCOUNT NO.:  [id number]   MEDICAL RECORD NO.:  [id number]          PATIENT TYPE:  OIB   LOCATION:  6525                         FACILITY:  [facility]   PHYSICIAN:  Cecil Cranker, M.D.DATE OF BIRTH:  1931/02/24   DATE OF ADMISSION:  05/15/2006  DATE OF DISCHARGE:  05/16/2006                                 DISCHARGE SUMMARY   PRINCIPAL DIAGNOSIS:  Unstable angina/coronary artery disease.   OTHER DIAGNOSES:  1.  Hypertension.  2.  Hyperlipidemia.  3.  Remote tobacco abuse.   ALLERGIES:  IV CONTRAST, NIACIN, ZETIA, and STATINS.   PROCEDURES:  Left heart cardiac catheterization with PCI and stenting of the  sequential vein graft to the first and second obtuse marginals with a bare  metal stent.   HISTORY OF PRESENT ILLNESS:  A 77 year old married white male with prior  history of CAD status post CABG in 1986 with redo CABG in 1996 who recently  saw Dr. Corinda Gubler in clinic on May 14, 2006 at which time he complained of a  three-week history of exertional chest discomfort.  He was then set up for  cardiac catheterization.   HOSPITAL COURSE:  Patient arrived at the cardiac catheterization laboratory  on June 20 and underwent left heart cardiac catheterization revealing a 95%  stenosis in the proximal sequential vein graft to the first and second  obtuse marginals.  His LIMA to LAD was patent and his vein graft to the  distal RCA was also patent.  He otherwise had native three-vessel disease.  He had then underwent successful PCI and stenting of the sequential vein  graft to the first and second obtuse marginals with placement of 3.5 x 24 mm  Liberte bare metal stent.  He tolerated this procedure well and post  procedure he has been ambulating without recurrent discomfort.  His ECG has  remained stable and he is being discharged home today in satisfactory  condition.   DISCHARGE LABORATORIES:  Hemoglobin 12.6, hematocrit 36.6, WBC 9.9,  platelets  209.  Sodium 139, potassium 4.1, chloride 108, CO2 26, BUN 24,  creatinine 1.4, glucose 110, calcium 8.9.   DISPOSITION:  Patient is being discharged home today in good condition.   FOLLOW-UP PLANS AND APPOINTMENTS:  He has a follow-up appointment Dr. Glennon Hamilton on July 17 at 10:30 a.m.  He is asked to follow up his primary care  physician, Dr. Olivia Canter in [place] in two weeks.   DISCHARGE MEDICATIONS:  1.  Plavix 75 mg daily.  2.  Aspirin 325 mg daily.  3.  Lopressor 50 mg b.i.d.  4.  HCTZ 25 mg daily.  5.  Norvasc 10 mg daily.  6.  Vasotec 10 mg daily.  7.  Nitroglycerin 0.4 mg sublingual p.r.n. chest pain.   OUTSTANDING LABORATORY STUDIES:  None.   DURATION OF DISCHARGE ENCOUNTER:  40 minutes including physician time.      Ok Anis, NP    ______________________________  E. Graceann Congress, M.D.    CRB/MEDQ  D:  05/16/2006  T:  05/16/2006  Job:  [id number]   cc:  Olivia Canter, M.D.  Kathryne Sharper

## 2011-04-13 NOTE — Cardiovascular Report (Signed)
NAME:  [name] [name], [name] [name]             ACCOUNT NO.:  [id number]   MEDICAL RECORD NO.:  [id number]          PATIENT TYPE:  OIB   LOCATION:  6525                         FACILITY:  [facility]   PHYSICIAN:  Arturo Morton. Riley Kill, M.D. [facility] OF BIRTH:  05/05/1931   DATE OF PROCEDURE:  05/18/2006  DATE OF DISCHARGE:  05/16/2006                              CARDIAC CATHETERIZATION   INDICATIONS:  The patient has had prior revascularization surgery.  He has  had recent recurrence of symptoms and underwent diagnostic cardiac  catheterization.  The study demonstrated patency of the internal mammary to  the LAD and patency of the free LIMA to the distal right circulation.  The  saphenous vein graft to the marginal and distal circumflex demonstrated a  high-grade stenosis in the proximal mid portion, and it is a critical  stenosis which is likely the source of symptoms.  The study was performed in  the [facility] lab, and I discussed the case with the patient and then subsequently  with Dr. Nechama Guard.  He was agreeable to proceed with percutaneous coronary  intervention.   The patient was given oral clopidogrel prior to the procedure.   PROCEDURE:  Percutaneous stenting of the saphenous vein graft to the obtuse  marginal and circumflex.   DESCRIPTION OF PROCEDURE:  The patient was brought to the catheterization  laboratory and prepped and draped in the usual fashion.  The preexisting 4-  French sheath was removed.  Bivalirudin was given as the anticoagulant, and  an appropriate ACT checked.  Following this, a filter wire was delivered  distal to the lesion.  The lesion was then predilated, and subsequently a  3.5 x 24 Liberte stent was deployed to the lesion site.  This was then taken  to high pressure, and there was what appeared to be an excellent  angiographic appearance.  The patient had some mild chest pain and was given  intracoronary verapamil administration.  There was no evidence of any  delayed runoff.   The filter wire was removed.  Final angiographic views were  obtained.  The femoral sheath was sewn into place, and it was taken to the  holding area in satisfactory clinical condition.   ANGIOGRAPHIC DATA:  The saphenous vein graft had a proximal lesion just  beyond the ostium characterized by segmental disease and a large caliber  graft.  Following stenting, this was reduced to less than 10% residual  luminal narrowing.  We did not post dilate as per our usual routine and  saphenous vein grafts.  There were no complications from the procedure.  The  patient will need to be maintained on aspirin and Plavix for a minimum of 4  weeks, and preferably longer if he tolerates this.  Follow-up will be with  Dr. Glennon Hamilton.      Arturo Morton. Riley Kill, M.D. [facility]  Electronically Signed     TDS/MEDQ  D:  05/18/2006  T:  05/18/2006  Job:  [id number]

## 2011-04-13 NOTE — Cardiovascular Report (Signed)
NAME:  [name] [name], [name] [name]             ACCOUNT NO.:  [id number]   MEDICAL RECORD NO.:  [id number]           PATIENT TYPE:   LOCATION:                                 FACILITY:   PHYSICIAN:  Arturo Morton. Riley Kill, M.D. [facility] DATE OF BIRTH:   DATE OF PROCEDURE:  05/15/2006  DATE OF DISCHARGE:                              CARDIAC CATHETERIZATION   INDICATIONS:  Mr. Lukasik is a very nice gentleman who has undergone  previous revascularization surgery.  The patient has had surgery both in  1986 and 1996.  He last underwent catheterization by Dr. Corinda Gubler in 2001.  At that time he had a right internal mammary to the distal left internal  mammary to the LAD and a saphenous vein graft to two obtuse marginal  branches.  The saphenous vein graft to the acute marginal in the right could  not be found.  He has recently developed some burning discomfort and as a  result of that it was felt that repeat angiography should be undertaken.  He  was seen by Dr. Corinda Gubler and set up for the [facility] laboratory.   PROCEDURE:  1.  Left heart catheterization.  2.  Selective coronary arteriography.  3.  Hand injection in the left ventricle.  4.  Saphenous vein graft angiography.  5.  Selective left internal mammary angiography.   DESCRIPTION OF PROCEDURE:  The patient was brought to the catheterization  laboratory and prepped and draped in the usual fashion.  Through an anterior  puncture the right femoral artery was easily entered.  A 6-French sheath was  placed.  We then took views of the left and right coronary arteries.  Following this vein graft angiography was undertaken.  We used a standard  right bypass catheter to engage the right internal mammary.  The left  internal mammary was engaged with a left internal mammary catheter.  Because  of the creatinine of 1.5 and need for percutaneous intervention we elected  to do just a hand injection into the ventricle.  The patient was then taken  off the table.  He was  given 300 mg of oral clopidogrel.  His fluids were  started at 150 an hour.  I discussed the case with the patient's family and  also with the patient.  He was given a bolus of heparin and the sheath was  sewn into place.   HEMODYNAMIC DATA:  1.  Central aortic pressure 153/67.  2.  Left ventricular pressure 137/14.  3.  No gradient on pullback across the aortic valve.   ANGIOGRAPHIC DATA:  1.  Ventriculography appeared to demonstrate preserved overall systolic      function, although it was a hand injection only.  This was done to      minimize contrast.  2.  The left main coronary artery is free of critical disease.  3.  The LAD has about an 80% stenosis before the septal perforator, then is      totally occluded.  There is a large diagonal branch with about 50%      segmental disease proximally.  4.  The internal  mammary to the distal left anterior descending artery is      widely patent.  There is about 40% narrowing at the insertion into the      LAD proper.  There is mild luminal irregularity in the distal LAD as it      wraps the apex and bifurcates.  5.  The circumflex proper is totally occluded.  6.  The saphenous vein graft to the circumflex demonstrates a segmental area      of 95% stenosis of the proximal portion of the graft.  The distal      portion of the graft inserts into two marginal branches and is smooth      throughout.  7.  The right coronary is totally occluded at the ostium.  8.  The right internal mammary to the distal right coronary appears to be      intact.  There is retrograde filling up into the acute marginal and      antegrade filling into the posterior descending branch.   CONCLUSIONS:  1.  Preserved overall left ventricular function.  2.  Continued patency of the internal mammary to the left anterior      descending and right internal mammary to the posterior descending      artery.  3.  High-grade segmental stenosis of the saphenous vein graft  to two obtuse      marginal branches.   I have discussed the need for percutaneous intervention with the patient and  his family.  Subsequently, we will make arrangements for laboratory later  today.  I have also called Dr. Corinda Gubler.      Arturo Morton. Riley Kill, M.D. [facility]  Electronically Signed     TDS/MEDQ  D:  05/15/2006  T:  05/15/2006  Job:  [id number]   cc:   Cecil Cranker, M.D.  [address]  [address]  [place]  [place] [address]   Olivia Canter, M.D.  [address]  [place] [address]

## 2011-04-13 NOTE — Op Note (Signed)
NAME:  MORDCHE, HEDGLIN                       ACCOUNT NO.:  [id number]   MEDICAL RECORD NO.:  [id number]                   PATIENT TYPE:  INP   LOCATION:  3307                                 FACILITY:  [facility]   PHYSICIAN:  Balinda Quails, M.D.                 DATE OF BIRTH:  02-19-1931   DATE OF PROCEDURE:  08/03/2004  DATE OF DISCHARGE:                                 OPERATIVE REPORT   PREOPERATIVE DIAGNOSIS:  Severe right internal carotid artery stenosis.   POSTOPERATIVE DIAGNOSIS:  Severe right internal carotid artery stenosis.   OPERATION PERFORMED:  Right carotid endarterectomy, Dacron patch  angioplasty.   SURGEON:  Balinda Quails, M.D.   ASSISTANT:  1.  Di Kindle. Edilia Bo, M.D.  2.  Pecola Leisure, [place]   ANESTHESIA:  General endotracheal.   ANESTHESIOLOGIST:  Germaine Pomfret, M.D.   INDICATIONS FOR PROCEDURE:  Mr. Chesnut is a 77 year old male with a  significant history of coronary artery disease and a history of coronary  artery bypass grafting on two separate occasions.  He was noted during  recent physical examination to have a new onset right carotid bruit.  Carotid Doppler evaluation on subsequent arteriography verified a very high  grade stenosis of the right internal carotid artery.  He is brought to the  operating room at this time for right carotid endarterectomy for reduction  of stroke risks.  The risks of this operative procedure with a major  morbidity and mortality is 1 to 2% to include but not limited to MI, CVA and  death have been discussed with the patient and family   DESCRIPTION OF PROCEDURE:  The patient was brought to the operating room in  stable condition.  General endotracheal anesthesia induced.  The patient  monitored with Foley catheter and arterial line.  The right neck was prepped  and draped in the sterile fashion.  A curvilinear skin incision was made  along the anterior border of the right sternomastoid muscle.   Dissection  carried down through the subcutaneous tissue with electrocautery.  Deep  dissection carried down to expose the carotid bifurcation.  Facial vein  ligated with 3-0 silk and divided.  The carotid bifurcation exposed.  The  common carotid artery mobilized down to the omohyoid muscle and encircled  with a vessel loop.  The vagus nerve reflected posteriorly and preserved.  The origin of the superior thyroid and external carotid were freed and  encircled with vessel loops.  The internal carotid artery was mobilized  distally up to the posterior belly of the digastric muscle and encircled  with a vessel loop.  The hypoglossal nerve identified and preserved.  The  patient administered 7000 units of heparin intravenously.  Adequate  circulation time permitted.   Carotid vessels were controlled with clamps.  A longitudinal arteriotomy was  made in the distal common carotid artery.  The arteriotomy extended across  the carotid  bulb and up into the internal carotid artery.  There was a large  plaque at the origin of the right internal carotid artery with a high grade  stenosis and early ulceration noted.  The shunt was inserted.  The plaque  was removed with an endarterectomy elevator.  The plaque raised down into  the common carotid artery where it was divided transversely with Potts  scissors.  Plaque then raised up into the bulb where the superior thyroid  and external carotid artery were endarterectomized using an eversion  technique.  The internal carotid artery plaque feathered out distally.  Fragments of plaque were removed with plaque forceps.  Site irrigated with  heparin saline solution.   A Finesse Dacron patch was placed over the endarterectomy site using running  6-0 Prolene suture.  At completion of the patch angioplasty, the shunt was  removed.  All vessels were flushed.  Clamps were removed directing initial  antegrade flow up the external carotid artery.  The internal  carotid artery  then released, and excellent pulse and Doppler signal were present.  The  patient administered 50 mg of protamine intravenously.  Adequate hemostasis  obtained.  Sponge and instrument counts were correct.  The sternomastoid  fascia closed with running 2-0 Vicryl suture.  Platysma closed with running  3-0 Vicryl suture.  Skin closed with 4-0 Monocryl.  Steri-Strips applied.  Sterile dressing applied.   Anesthesia reversed in the operating room.  The patient awakened readily.  Transferred to the recovery room in stable condition.  There were no  apparent complications.  Neurologically intact.                                               Balinda Quails, M.D.    PGH/MEDQ  D:  08/03/2004  T:  08/04/2004  Job:  [id number]   cc:   Cecil Cranker, M.D.   Dr. Olivia Canter, [place]

## 2011-04-13 NOTE — Op Note (Signed)
NAME:  [name] [name], [name] [name]                       ACCOUNT NO.:  [id number]   MEDICAL RECORD NO.:  [id number]                   PATIENT TYPE:  OIB   LOCATION:  2899                                 FACILITY:  [facility]   PHYSICIAN:  Balinda Quails, M.D.                 DATE OF BIRTH:  Apr 16, 1931   DATE OF PROCEDURE:  DATE OF DISCHARGE:  07/20/2004                                 OPERATIVE REPORT   DIAGNOSIS:  Extracranial cerebrovascular occlusive disease with severe right  internal carotid artery stenosis.   PROCEDURE:  1. Arch aortogram.  2. Innominate arteriogram.  3. Bilateral selective carotid arteriogram.   ACCESS:  Right common femoral artery 5 French sheath.   CONTRAST:  120 mL Visipaque.   COMPLICATIONS:  None apparent.   CLINICAL NOTE:  [name] [name] is a 77 year old male with a history of  known atherosclerotic vascular disease.  During recent evaluation he was  noted to have a new onset right carotid bruit and underwent a carotid  Doppler evaluation.  This revealed evidence of severe right internal carotid  artery stenosis.  He is brought to the cath lab at this time for diagnostic  arteriography.   PROCEDURE NOTE:  Patient brought to the cath lab in stable condition.  Placed in the supine position.  Both groins were prepped and draped in a  sterile fashion.  The skin and subcutaneous tissue and right groin instilled  with 1% Xylocaine.  A needle was easily introduced in the right common  femoral artery.  A 0.035 J wire passed through the needle into the mid  abdominal aorta.   The needle removed, a 5 French sheath advanced over the guide wire.  Dilator  removed and sheath flushed with heparin and saline solution.   A standard pigtail catheter was then advanced over the guide wire.  The  guide wire and catheter were then advanced to the ascending aorta.  A 40  degree projection LAO arch aortogram obtained.  The innominate artery was  widely patent.  The origin of  the proximal right common carotid and right  subclavian arteries were normal.  Small right vertebral artery was present.  The left common carotid origin was widely patent.  The left subclavian  origin and proximal artery were normal.  There was a large left vertebral  artery which was dominant.   An exchange then made for a JV1 catheter.  The JV1 catheter was engaged in  the innominate origin.  An innominate arteriogram was obtained in the RAO  projection.  This revealed a small right vertebral artery.  Antegrade flow  was present.  The origin of the right subclavian and right common carotid  arteries were widely patent.   The guide wire was then advanced into the right common carotid artery.  Cervical and cranial views were obtained with selective right carotid  injection.  The right carotid bifurcation revealed extensive plaque  disease  with a high grade stenosis at the origin of the right internal carotid  artery estimated to be 90%.  The internal carotid artery was patent to the  base of the skull.  The right external carotid artery was normal in caliber.   The JV1 catheter was then engaged in the origin of the left common carotid  artery.  Left carotid arteriography in the cervical and intracranial  projections were made.  The left carotid bifurcation revealed posterior  plaque with less than 30% stenosis.  Ulceration was noted in the plaque.  The internal carotid artery widely patent to the base of the skull and the  left external carotid artery was patent.   Intracranial views will be interpreted under separate heading by  neuroradiology.   The patient tolerated the procedure well.  Catheter removed.  Right femoral  sheath removed.  No apparent complications.   FINAL IMPRESSION:  1. Normal arch aortogram.  2. Dominant left vertebral artery with small right vertebral artery.     Antegrade vertebral flow bilaterally.  3. Severe right internal carotid artery stenosis estimated  to be greater     than 90%.  4. Mild left carotid bifurcation disease with ulceration and no significant     stenosis of the internal carotid artery.   DISPOSITION:  These results have been reviewed with the patient and family.  It is recommended that the patient undergo right carotid endarterectomy for  reduction of stroke risk.  He will discontinue Plavix for one week and begin  taking full strength aspirin at 325 mg daily.  Surgery will be electively  scheduled within the next week or 10 days approximately.  The risks of the  procedure, with 1-2% major morbidity mortality, to include MI, CVA, cranial  nerve injury, and death have been discussed.                                               Balinda Quails, M.D.    PGH/MEDQ  D:  07/20/2004  T:  07/20/2004  Job:  [id number]   cc:   Cecil Cranker, M.D.

## 2011-04-13 NOTE — Cardiovascular Report (Signed)
[facility]  Patient:    [name] [name], [name] [name]                    MRN: [id number] Proc. Date: 06/05/00 Adm. Date:  [id number] Attending:  Alric Quan CC:         Cecil Cranker, M.D. [facility]             Pearson Forster, M.D., [address], [place], [place] [address]             Cardiac Catheterization Laboratory                        Cardiac Catheterization  INDICATIONS:  The patient is a 77 year old white married male with CABG initially in 1988, re-do in 1996.  In 1998 he had an LIMA to LAD which was patent.  When restudied in 1996 the SVG to the OM-1 and distal circumflex was totally occluded in the distal limb and proximally had a high-grade lesion and the SVG to the RCA was severely diseased.  The patient had re-do CABG at which time he had an RIMA and SVG to RCA, SVG to OM and circumflex initially to anterior marginal branch.  The patient now returns with symptoms suggesting unstable angina.  RESULTS:  PRESSURES:  LV systolic 122, diastolic 12.  AO systolic 122, diastolic 59.  ANGIOGRAPHY: 1. Left main:  The left main coronary artery was normal. 2. Left anterior descending:  The left anterior descending was    totally occluded after the septal and first diagonal. 3. Circumflex:  The circumflex coronary is totally occluded proximally. 4. Right coronary artery:  The right coronary previously noted to be    totally occluded proximally and was not visualized.  BYPASS GRAFT ANGIOGRAPHY: 1. LIMA to the LAD was widely patent with no distal disease. 2. The RIMA to the RCA is widely patent.  It appeared to go to the    posterior descending branch and there was a high-grade lesion to the    posterolateral branch which I think had progressed since the last    study of 1966. 3. The SVG to the OM-1 and distal circumflex was widely patent with good    filling. 4. The SVG to the anterior marginal was not located, was not visualized  selectively or with a aortic root angiogram.  There was no marker.  The left ventricular angiogram revealed normal LV.  The aortic root angiogram revealed graft to the circumflex and RIMA to the RCA.  There was no aortic regurgitation.  Abdominal aortogram revealed 70% stenosis in both renal arteries.  There was no aneurysm.  SUMMARY:  The patient with severe three-vessel coronary artery disease with previous CABG x 2 with a total occlusion in all the native coronary arteries is a patent LIMA to LAD, patent SVG to OM-1 and distal circumflex, patent RIMA to the RCA.  The SVG to the anterior margin was not visualized.  There was a high-grade lesion in the posterolateral branch.  The LV was normal.  Abdominal aortogram revealed bilateral renal artery stenosis of 70% as noted above.  Aortic root angiogram revealed no AI.  The two grafts were patent as noted.  The patient will be reviewed by my colleagues ______ the chest pain. Continue medical therapy. DD:  06/05/00 TD:  06/06/00 Job: [id number]

## 2011-04-19 NOTE — Op Note (Signed)
NAME:  [name] [name], [name] [name]             ACCOUNT NO.:  [id number]  MEDICAL RECORD NO.:  [id number]           PATIENT TYPE:  O  LOCATION:  DAYL                         FACILITY:  [facility]  PHYSICIAN:  Excell Seltzer. Annabell Howells, M.D.    DATE OF BIRTH:  1931/02/13  DATE OF PROCEDURE:  04/12/2011 DATE OF DISCHARGE:                              OPERATIVE REPORT   PROCEDURE:  Transurethral resection of 2-5 cm bladder tumor, installation of mitomycin C.  PREOPERATIVE DIAGNOSIS:  2-3 cm patch of bladder tumors.  POSTOPERATIVE DIAGNOSIS:  2-3 cm patch of bladder tumors.  SURGEON:  Excell Seltzer. Annabell Howells, M.D.  ANESTHESIA:  General.  SPECIMEN:  Bladder tumor.  DRAINS:  20-French Foley catheter.  COMPLICATIONS:  None.  INDICATIONS:  [name] is a 77 year old white male who was initially evaluated for gross hematuria and was found to have papillary bladder tumors on the left bladder wall.  It was initially felt to be at the neck of the bladder and resection was indicated.  FINDINGS AND PROCEDURE:  The patient was given Cipro.  He was taken to the operating room where general anesthetic was induced.  He was fitted with PAS hose and placed in the lithotomy position.  His perineum and genitalia were prepped with Betadine solution and he was draped in the usual sterile fashion.  Cystoscopy was performed using a 22-French scope and 12 and 70-degrees lenses.  Examination revealed a normal urethra. The external sphincter was intact.  The prostatic urethra was 2-3 cm in length with bilobar hyperplasia with coaptation and obstruction. Examination of the bladder revealed mild-to-moderate trabeculation with some cellules.  Ureteral orifices were in their normal anatomic position, effluxing clear urine.  The papillary tumor was identified on the left lateral wall.  The bladder was actually just above the trigonal ridge, more towards the fourth than the ceiling, more towards the posterior wall than the anterior wall.   There was approximately a 2-cm to 3-cm patch of papillary bladder tumors with at least five small areas of tumor within this patch.  Once inspection had been performed, the urethra was calibrated 3-French with Sissy Hoff sounds and a 28-French continuous flow resectoscope sheath was inserted.  This was fitted with an [place] handle, 12- degrees lens and right-angle loop.  The tumors were then resected.  The contiguous area of resection was approximately 3 cm.  Once hemostasis was achieved and the specimen retrieved from the bladder, a 20-French Foley catheter was inserted.  The balloon was filled with 10 cc of sterile fluid and the catheter was placed to straight drainage.  At this point mitomycin C 40 mg in 40 cc of diluent was ordered and was then instilled into the bladder through the catheter, which was plugged for 30 minutes and then drained.  The patient had been moved to the recovery room for this portion of the procedure after reversal of his anesthetic.  There were no complications.     Excell Seltzer. Annabell Howells, M.D.     JJW/MEDQ  D:  04/12/2011  T:  04/12/2011  Job:  [id number]  cc:   Olivia Canter, MD Fax: [phone]  Electronically  Signed by [name] M.D. on 04/19/2011 07:58:45 AM

## 2011-06-20 ENCOUNTER — Other Ambulatory Visit: Payer: Self-pay | Admitting: Cardiology

## 2011-08-01 ENCOUNTER — Encounter: Payer: Self-pay | Admitting: Emergency Medicine

## 2011-08-01 ENCOUNTER — Ambulatory Visit
Admission: RE | Admit: 2011-08-01 | Discharge: 2011-08-01 | Disposition: A | Discharge status: Home / Self Care | Payer: Medicare Other | Source: Ambulatory Visit | Attending: Emergency Medicine | Admitting: Emergency Medicine

## 2011-08-01 ENCOUNTER — Other Ambulatory Visit: Payer: Self-pay | Admitting: Emergency Medicine

## 2011-08-01 ENCOUNTER — Inpatient Hospital Stay (INDEPENDENT_AMBULATORY_CARE_PROVIDER_SITE_OTHER)
Admission: RE | Admit: 2011-08-01 | Discharge: 2011-08-01 | Disposition: A | Discharge status: Home / Self Care | Payer: Medicare Other | Source: Ambulatory Visit | Attending: Emergency Medicine | Admitting: Emergency Medicine

## 2011-08-01 DIAGNOSIS — M79609 Pain in unspecified limb: Secondary | ICD-10-CM

## 2011-10-29 NOTE — Progress Notes (Signed)
Summary: Possible frature of foot Room 4   Vital Signs:  Patient Profile:   77 Years Old Male CC:      Rt foot injury got cought between boat and trailer today Height:     68 inches Weight:      190 pounds O2 Sat:      97 % O2 treatment:    Room Air Temp:     97.8 degrees F oral Pulse rate:   66 / minute Pulse rhythm:   regular Resp:     16 per minute  Pt. in pain?   yes    Location:   foot    Intensity:   5    Type:       sharp  Vitals Entered By: Emilio Math (August 01, 2011 4:50 PM)                   Current Allergies: ! ULORIC (FEBUXOSTAT) ! * MELOXICANHistory of Present Illness History from: patient & wife Chief Complaint: Rt foot injury got cought between boat and trailer today History of Present Illness: R Foot pain after getting it caught in between trailer and boat today.  He felt the foot get squeezed while wearing his boots.  It was painful at the time, now 5/10.  Feels l a little swollen. Not taking any meds or using modalities.    REVIEW OF SYSTEMS Constitutional Symptoms      Denies fever, chills, night sweats, weight loss, weight gain, and fatigue.  Eyes       Denies change in vision, eye pain, eye discharge, glasses, contact lenses, and eye surgery. Ear/Nose/Throat/Mouth       Denies hearing loss/aids, change in hearing, ear pain, ear discharge, dizziness, frequent runny nose, frequent nose bleeds, sinus problems, sore throat, hoarseness, and tooth pain or bleeding.  Respiratory       Denies dry cough, productive cough, wheezing, shortness of breath, asthma, bronchitis, and emphysema/COPD.  Cardiovascular       Denies murmurs, chest pain, and tires easily with exhertion.    Gastrointestinal       Denies stomach pain, nausea/vomiting, diarrhea, constipation, blood in bowel movements, and indigestion. Genitourniary       Denies painful urination, kidney stones, and loss of urinary control. Neurological       Denies paralysis, seizures, and  fainting/blackouts. Musculoskeletal       Complains of muscle pain, joint pain, joint stiffness, and decreased range of motion.      Denies redness, swelling, muscle weakness, and gout.  Skin       Denies bruising, unusual mles/lumps or sores, and hair/skin or nail changes.  Psych       Denies mood changes, temper/anger issues, anxiety/stress, speech problems, depression, and sleep problems.  Past History:  Past Medical History: Reviewed history from 11/01/2010 and no changes required. TRANSIENT ISCHEMIC ATTACK (ICD-435.9) HYPERLIPIDEMIA (ICD-272.4) HYPERTENSION (ICD-401.9) CAD (ICD-414.00) CEREBROVASCULAR DISEASE (ICD-437.9) NEPHROLITHIASIS (ICD-592.0) GOUT (ICD-274.9) RENAL INSUFFICIENCY (ICD-588.9) Remote history of carbon monoxide poisoning.  He has a history of initial myocardial  infarction  Past Surgical History: Reviewed history from 01/18/2010 and no changes required.    coronary artery bypass grafting in 1986 and he had a redo  coronary artery bypass grafting in 1996.  History of carotid endarterectomy.  Status post knee replacement 12/10  Family History: Reviewed history from 07/26/2009 and no changes required.  Father died at age 76 of a myocardial infarction.  He has  several siblings.  One sister had coronary bypass grafting, and 1 brother  had a cerebrovascular accident.  Social History: Reviewed history from 11/01/2010 and no changes required.  The patient lives in [place] with his wife.  He is a  retired Games developer.  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 Remote 10 pack year history of tobacco abuse.  He never drinks alcohol.  He bicycles approximately 2 miles a day. Physical Exam General appearance: well developed, well nourished, no acute distress Head: normocephalic, atraumatic MSE: oriented to time, place, and person R foot/ ankle: FROM.  +TTP metatarsals globally with swelling dorsally.  No TTP medial/lateral malleolus, navicular, base of 5th,  calcaneus, Achilles, or proximal fibula. No ecchymoses. Distal NV status intact.  Limping gait. Assessment New Problems: FOOT PAIN (ICD-729.5)   Plan New Orders: New Patient Level III [99203] T-DG Foot Complete*R* [73630] Planning Comments:   Xray is ordered and read by radiology as normal.  Encourage ice, elevation, rest.  If not imrpoving by next week, would like him to f/u with sports med next week.  He has a boot at home that he may wear for a few days if it helps him feel better.   The patient and/or caregiver has been counseled thoroughly with regard to medications prescribed including dosage, schedule, interactions, rationale for use, and possible side effects and they verbalize understanding.  Diagnoses and expected course of recovery discussed and will return if not improved as expected or if the condition worsens. Patient and/or caregiver verbalized understanding.   Orders Added: 1)  New Patient Level III [99203] 2)  T-DG Foot Complete*R* [54098]

## 2011-10-31 ENCOUNTER — Telehealth: Payer: Self-pay | Admitting: Cardiology

## 2011-10-31 NOTE — Telephone Encounter (Signed)
Pt just wanted a appt and I scheduled it but since I had the screen up I sent the message. Have a good.

## 2011-11-14 ENCOUNTER — Encounter: Payer: Self-pay | Admitting: Cardiology

## 2011-11-14 ENCOUNTER — Ambulatory Visit (INDEPENDENT_AMBULATORY_CARE_PROVIDER_SITE_OTHER): Payer: Medicare Other | Admitting: Cardiology

## 2011-11-14 DIAGNOSIS — I6529 Occlusion and stenosis of unspecified carotid artery: Secondary | ICD-10-CM

## 2011-11-14 DIAGNOSIS — E785 Hyperlipidemia, unspecified: Secondary | ICD-10-CM

## 2011-11-14 DIAGNOSIS — I10 Essential (primary) hypertension: Secondary | ICD-10-CM

## 2011-11-14 DIAGNOSIS — I679 Cerebrovascular disease, unspecified: Secondary | ICD-10-CM

## 2011-11-14 DIAGNOSIS — I2510 Atherosclerotic heart disease of native coronary artery without angina pectoris: Secondary | ICD-10-CM

## 2011-11-14 NOTE — Patient Instructions (Signed)
Your physician wants you to follow-up in: one year You will receive a reminder letter in the mail two months in advance. If you don't receive a letter, please call our office to schedule the follow-up appointment.   Your physician has requested that you have a carotid duplex. This test is an ultrasound of the carotid arteries in your neck. It looks at blood flow through these arteries that supply the brain with blood. Allow one hour for this exam. There are no restrictions or special instructions.

## 2011-11-14 NOTE — Assessment & Plan Note (Signed)
Followup carotid Dopplers February 2013.

## 2011-11-14 NOTE — Assessment & Plan Note (Signed)
Blood pressure mildly elevated. However he states normal at home. We will follow and increase medications as needed.

## 2011-11-14 NOTE — Assessment & Plan Note (Signed)
Continue aspirin. Intolerant to statins. 

## 2011-11-14 NOTE — Progress Notes (Signed)
HPI:[name] [name] is a very pleasant gentleman with a history of coronary artery disease. He has a history of myocardial infarction and coronary artery bypass grafting in 1986 and he had a redo coronary artery bypass grafting in 1996. Cardiac catheterization in June of 2007 revealed normal LV functionl. His LAD was totally occluded. The LIMA to the LAD was widely patent and there was a 40% stenosis at the insertion into the LAD. The circumflex was totalled and the saphenous vein graft to the circumflex had a 95% stenosis of the proximal portion. The right coronary artery was occluded and the RIMA to the right coronary artery was intact. The patient had a bare metal stent at that time to the saphenous vein graft to the circumflex. Note he had an abdominal ultrasound in June of 2008 that showed no aneurysm. He has also had a history of a carotid endarterectomy and his most recent carotid Doppler performed in Feb 2012 showed a 40% to 59% left internal carotid artery stenosis and a 0% to 39% right. Followup recommended in one year. Last Myoview was performed in Feb, 2011 showed normal perfusion. Ejection fraction was 62%. I last saw him in April 2012. Since then the patient denies any dyspnea on exertion, orthopnea, PND, pedal edema, palpitations, syncope or chest pain.    Current Outpatient Prescriptions  Medication Sig Dispense Refill  . allopurinol (ZYLOPRIM) 100 MG tablet Take 100 mg by mouth daily.        Marland Kitchen amLODipine (NORVASC) 10 MG tablet Take 10 mg by mouth daily.        Marland Kitchen aspirin 81 MG tablet Take 81 mg by mouth daily.        . Calcium Carbonate-Vitamin D 600-400 MG-UNIT per tablet Take 1 tablet by mouth daily.        . Cholecalciferol (VITAMIN D) 2000 UNITS CAPS Take by mouth 2 (two) times daily.        . diphenhydrAMINE (BENADRYL) 25 MG tablet Take 25 mg by mouth daily.        . hydrochlorothiazide 25 MG tablet 1 tab po qd.... take an extra tab if swelling increases  60 tablet  3  . metoprolol  (LOPRESSOR) 50 MG tablet TAKE 1 TABLET TWICE A DAY  60 tablet  12  . Omega-3 Fatty Acids (FISH OIL) 1200 MG CAPS Take by mouth.        . predniSONE (DELTASONE) 10 MG tablet Take 10 mg by mouth daily.        . Red Yeast Rice 600 MG TABS Take by mouth 2 (two) times daily.        . saw palmetto 500 MG capsule Take 500 mg by mouth daily.           Past Medical History  Diagnosis Date  . HYPERLIPIDEMIA   . GOUT   . HYPERTENSION   . CAD   . TRANSIENT ISCHEMIC ATTACK   . NEPHROLITHIASIS   . RENAL INSUFFICIENCY   . CONTRAST DYE ALLERGY, HX OF     Past Surgical History  Procedure Date  . Coronary artery bypass graft 1986  . Carotid endarterectomy   . Coronary artery bypass graft 1996  . Total knee arthroplasty 10/2009    History   Social History  . Marital Status: Married    Spouse Name: N/A    Number of Children: N/A  . Years of Education: N/A   Occupational History  . Not on file.   Social History Main Topics  .  Smoking status: Former Smoker    Types: Cigarettes    Quit date: 11/26/1965  . Smokeless tobacco: Not on file  . Alcohol Use: No  . Drug Use: No  . Sexually Active: Not on file   Other Topics Concern  . Not on file   Social History Narrative  . No narrative on file    ROS: Some arthralgias and burning in his feet but no fevers or chills, productive cough, hemoptysis, dysphasia, odynophagia, melena, hematochezia, dysuria, hematuria, rash, seizure activity, orthopnea, PND, pedal edema, claudication. Remaining systems are negative.  Physical Exam: Well-developed well-nourished in no acute distress.  Skin is warm and dry.  HEENT is normal.  Neck is supple. No thyromegaly.  Chest is clear to auscultation with normal expansion. Previous sternotomy Cardiovascular exam is regular rate and rhythm.  Abdominal exam nontender or distended. No masses palpated. Extremities show no edema. neuro grossly intact  ECG sinus rhythm at a rate of 80. Axis normal. Prior  inferior infarct.

## 2011-11-14 NOTE — Assessment & Plan Note (Signed)
Continue diet. Intolerant to statins. 

## 2011-11-26 ENCOUNTER — Other Ambulatory Visit: Payer: Self-pay | Admitting: Cardiology

## 2011-11-27 DIAGNOSIS — C801 Malignant (primary) neoplasm, unspecified: Secondary | ICD-10-CM

## 2011-11-27 HISTORY — DX: Malignant (primary) neoplasm, unspecified: C80.1

## 2011-12-14 ENCOUNTER — Encounter (INDEPENDENT_AMBULATORY_CARE_PROVIDER_SITE_OTHER): Payer: Medicare Other | Admitting: *Deleted

## 2011-12-14 DIAGNOSIS — I6529 Occlusion and stenosis of unspecified carotid artery: Secondary | ICD-10-CM

## 2012-02-29 ENCOUNTER — Other Ambulatory Visit: Payer: Self-pay | Admitting: Cardiology

## 2012-02-29 NOTE — Telephone Encounter (Signed)
New Problem:    Patinet's wife called in needing her husbands medication of enalpril refilled.  They went to the pharmacy and was told it was denied. Please call back.

## 2012-04-15 ENCOUNTER — Other Ambulatory Visit: Payer: Self-pay | Admitting: Cardiology

## 2012-04-16 ENCOUNTER — Other Ambulatory Visit: Payer: Self-pay | Admitting: *Deleted

## 2012-04-16 MED ORDER — HYDROCHLOROTHIAZIDE 25 MG PO TABS: ORAL_TABLET | ORAL | Status: DC

## 2012-05-20 DIAGNOSIS — C4401 Basal cell carcinoma of skin of lip: Secondary | ICD-10-CM | POA: Insufficient documentation

## 2012-05-22 ENCOUNTER — Encounter (HOSPITAL_BASED_OUTPATIENT_CLINIC_OR_DEPARTMENT_OTHER): Payer: Self-pay | Admitting: *Deleted

## 2012-05-22 NOTE — Progress Notes (Signed)
To come in for bmet-had ekg 12/12 No resp problems

## 2012-05-23 ENCOUNTER — Other Ambulatory Visit: Payer: Self-pay | Admitting: Physician Assistant

## 2012-05-23 NOTE — H&P (Signed)
   History and Physical [name] [name]   05/20/2012 8:00 AM Initial consult  MRN: [id number]   Department: [facility]  Dept Phone: [phone]   Description: Male DOB: 02-04-1931  Provider: Wayland Denis, DO   Primary Coverage   Payor Plan Sponsor Code Group Number Group Name    [facility] ADVANTAGE AARP MEDICARE COMPLETE           Diagnoses     Basal cell carcinoma of lip   - Primary    173.01      Dx: Basal cell carcinoma of skin of lip [173.01]    Vitals - Last Recorded     BP Ht Wt BMI     129/62  1.727 m (5\' 8" )  83.915 kg (185 lb)  28.13 kg/m2         Subjective:    Patient ID: [name] [name] is a 78 y.o. male  The patient is a 78 yrs old wm male here with his wife for evaluation of an upper lip defect.  On May 16, 2012 he had an excision of the skin, vermillion border and a portion of the obicularis muscle by Moh's technique.  The pathology showed a basal cell carcinoma and the margins are now negative. The defect is 1.5 x 2.5.  He states the area was small and had been growing over the past several years.  It would get large, fall off and then grow again.  He has also has a history of bladder cancer.  He is currently placing antibiotic ointment and a gauze on the area.  He is able to pucker the muscle at this time.   The following portions of the patient's history were reviewed and updated as appropriate: allergies, current medications, past family history, past medical history, past social history, past surgical history and problem list.   Review of Systems  Constitutional: Negative.   HENT: Negative.   Eyes: Negative.   Respiratory: Negative.   Cardiovascular: Negative.   Gastrointestinal: Negative.   Genitourinary: Negative.   Musculoskeletal: Negative.   Skin: Positive for color change and wound.  Neurological: Negative.   Hematological: Negative.   Psychiatric/Behavioral: Negative.         Objective:    Physical Exam  Constitutional: He appears  well-developed and well-nourished.  HENT:   Head: Normocephalic and atraumatic.   Mouth/Throat:  Eyes: Conjunctivae normal and EOM are normal. Pupils are equal, round, and reactive to light.  Neck: Normal range of motion.  Cardiovascular: Normal rate.   Pulmonary/Chest: Effort normal. No respiratory distress. He has no wheezes.  Abdominal: Soft. He exhibits no distension. There is no tenderness.  Neurological: He is alert.  Skin: Skin is warm.  Psychiatric: He has a normal mood and affect. His behavior is normal. Judgment and thought content normal.  Assessment:    1.  Basal cell carcinoma of lip    Plan:   Recommend repair with reapproximation of the obicularis muscle and likely a rotation, advancement or lip switch repair with Acell placement.  Risks and complications were reviewed and include bleeding, pain, scar and risk of anesthesia.  The defect is not full thickness and may be able to be repaired with a vermillion lip switch.

## 2012-05-27 ENCOUNTER — Encounter (HOSPITAL_BASED_OUTPATIENT_CLINIC_OR_DEPARTMENT_OTHER): Payer: Self-pay | Admitting: Anesthesiology

## 2012-05-27 ENCOUNTER — Encounter (HOSPITAL_BASED_OUTPATIENT_CLINIC_OR_DEPARTMENT_OTHER): Payer: Self-pay | Admitting: Plastic Surgery

## 2012-05-27 ENCOUNTER — Ambulatory Visit (HOSPITAL_BASED_OUTPATIENT_CLINIC_OR_DEPARTMENT_OTHER)
Admission: RE | Admit: 2012-05-27 | Discharge: 2012-05-27 | Disposition: A | Discharge status: Home / Self Care | Payer: Medicare Other | Source: Ambulatory Visit | Attending: Plastic Surgery | Admitting: Plastic Surgery

## 2012-05-27 ENCOUNTER — Encounter (HOSPITAL_BASED_OUTPATIENT_CLINIC_OR_DEPARTMENT_OTHER)
Admission: RE | Disposition: A | Discharge status: Home / Self Care | Payer: Self-pay | Source: Ambulatory Visit | Attending: Plastic Surgery

## 2012-05-27 ENCOUNTER — Ambulatory Visit (HOSPITAL_BASED_OUTPATIENT_CLINIC_OR_DEPARTMENT_OTHER): Payer: Medicare Other | Admitting: Anesthesiology

## 2012-05-27 DIAGNOSIS — Z85819 Personal history of malignant neoplasm of unspecified site of lip, oral cavity, and pharynx: Secondary | ICD-10-CM | POA: Insufficient documentation

## 2012-05-27 DIAGNOSIS — K130 Diseases of lips: Secondary | ICD-10-CM | POA: Insufficient documentation

## 2012-05-27 DIAGNOSIS — Z9889 Other specified postprocedural states: Secondary | ICD-10-CM | POA: Diagnosis present

## 2012-05-27 DIAGNOSIS — I739 Peripheral vascular disease, unspecified: Secondary | ICD-10-CM | POA: Insufficient documentation

## 2012-05-27 DIAGNOSIS — I10 Essential (primary) hypertension: Secondary | ICD-10-CM | POA: Insufficient documentation

## 2012-05-27 DIAGNOSIS — Z951 Presence of aortocoronary bypass graft: Secondary | ICD-10-CM | POA: Insufficient documentation

## 2012-05-27 DIAGNOSIS — I2510 Atherosclerotic heart disease of native coronary artery without angina pectoris: Secondary | ICD-10-CM | POA: Insufficient documentation

## 2012-05-27 DIAGNOSIS — Z428 Encounter for other plastic and reconstructive surgery following medical procedure or healed injury: Secondary | ICD-10-CM | POA: Insufficient documentation

## 2012-05-27 DIAGNOSIS — Z8551 Personal history of malignant neoplasm of bladder: Secondary | ICD-10-CM | POA: Insufficient documentation

## 2012-05-27 HISTORY — PX: MASS EXCISION: SHX2000

## 2012-05-27 LAB — POCT I-STAT, CHEM 8
BUN: 32 mg/dL — ABNORMAL HIGH (ref 6–23)
Creatinine, Ser: 1.8 mg/dL — ABNORMAL HIGH (ref 0.50–1.35)
Potassium: 3.8 mEq/L (ref 3.5–5.1)
Sodium: 140 mEq/L (ref 135–145)
TCO2: 25 mmol/L (ref 0–100)

## 2012-05-27 SURGERY — EXCISION MASS
Anesthesia: General

## 2012-05-27 MED ORDER — PROPOFOL 10 MG/ML IV EMUL
INTRAVENOUS | Status: DC | PRN
  Administered 2012-05-27: 175 mg via INTRAVENOUS

## 2012-05-27 MED ORDER — LIDOCAINE HCL (CARDIAC) 20 MG/ML IV SOLN
INTRAVENOUS | Status: DC | PRN
  Administered 2012-05-27: 100 mg via INTRAVENOUS

## 2012-05-27 MED ORDER — LACTATED RINGERS IV SOLN
INTRAVENOUS | Status: DC
  Administered 2012-05-27 (×2): via INTRAVENOUS

## 2012-05-27 MED ORDER — CEFAZOLIN SODIUM 1-5 GM-% IV SOLN
1.0000 g | INTRAVENOUS | Status: AC
  Administered 2012-05-27: 2 g via INTRAVENOUS

## 2012-05-27 MED ORDER — LIDOCAINE-EPINEPHRINE 1 %-1:100000 IJ SOLN
INTRAMUSCULAR | Status: DC | PRN
  Administered 2012-05-27: 6 mL

## 2012-05-27 MED ORDER — DEXAMETHASONE SODIUM PHOSPHATE 4 MG/ML IJ SOLN
INTRAMUSCULAR | Status: DC | PRN
  Administered 2012-05-27: 4 mg via INTRAVENOUS

## 2012-05-27 MED ORDER — OXYCODONE HCL 5 MG PO TABS: 5.0000 mg | ORAL_TABLET | Freq: Once | ORAL | Status: DC | PRN

## 2012-05-27 MED ORDER — ACETAMINOPHEN 10 MG/ML IV SOLN
1000.0000 mg | Freq: Once | INTRAVENOUS | Status: AC
  Administered 2012-05-27: 1000 mg via INTRAVENOUS

## 2012-05-27 MED ORDER — FENTANYL CITRATE 0.05 MG/ML IJ SOLN
25.0000 ug | INTRAMUSCULAR | Status: DC | PRN
  Administered 2012-05-27 (×2): 25 ug via INTRAVENOUS
  Administered 2012-05-27: 50 ug via INTRAVENOUS

## 2012-05-27 MED ORDER — METOCLOPRAMIDE HCL 5 MG/ML IJ SOLN: 10.0000 mg | Freq: Once | INTRAMUSCULAR | Status: DC | PRN

## 2012-05-27 MED ORDER — FENTANYL CITRATE 0.05 MG/ML IJ SOLN
INTRAMUSCULAR | Status: DC | PRN
  Administered 2012-05-27: 100 ug via INTRAVENOUS
  Administered 2012-05-27 (×2): 50 ug via INTRAVENOUS

## 2012-05-27 SURGICAL SUPPLY — 71 items
APPLICATOR COTTON TIP 6IN STRL (MISCELLANEOUS) ×4 IMPLANT
BAG DECANTER FOR FLEXI CONT (MISCELLANEOUS) IMPLANT
BANDAGE ELASTIC 3 VELCRO ST LF (GAUZE/BANDAGES/DRESSINGS) IMPLANT
BANDAGE ELASTIC 4 VELCRO ST LF (GAUZE/BANDAGES/DRESSINGS) IMPLANT
BANDAGE ELASTIC 6 VELCRO ST LF (GAUZE/BANDAGES/DRESSINGS) IMPLANT
BANDAGE GAUZE ELAST BULKY 4 IN (GAUZE/BANDAGES/DRESSINGS) IMPLANT
BENZOIN TINCTURE PRP APPL 2/3 (GAUZE/BANDAGES/DRESSINGS) ×2 IMPLANT
BLADE DERMATOME SS (BLADE) IMPLANT
BLADE SURG 15 STRL LF DISP TIS (BLADE) ×1 IMPLANT
BLADE SURG 15 STRL SS (BLADE) ×1
BLADE SURG ROTATE 9660 (MISCELLANEOUS) IMPLANT
BNDG COHESIVE 4X5 TAN STRL (GAUZE/BANDAGES/DRESSINGS) IMPLANT
CANISTER SUCTION 1200CC (MISCELLANEOUS) ×2 IMPLANT
CLOTH BEACON ORANGE TIMEOUT ST (SAFETY) ×2 IMPLANT
COTTONBALL LRG STERILE PKG (GAUZE/BANDAGES/DRESSINGS) IMPLANT
COVER MAYO STAND STRL (DRAPES) ×2 IMPLANT
COVER TABLE BACK 60X90 (DRAPES) ×2 IMPLANT
DECANTER SPIKE VIAL GLASS SM (MISCELLANEOUS) IMPLANT
DERMABOND ADVANCED (GAUZE/BANDAGES/DRESSINGS)
DERMABOND ADVANCED .7 DNX12 (GAUZE/BANDAGES/DRESSINGS) IMPLANT
DERMACARRIERS GRAFT 1 TO 1.5 (DISPOSABLE)
DRAPE EXTREMITY T 121X128X90 (DRAPE) IMPLANT
DRAPE INCISE IOBAN 66X45 STRL (DRAPES) IMPLANT
DRAPE PED LAPAROTOMY (DRAPES) IMPLANT
DRAPE SURG 17X23 STRL (DRAPES) IMPLANT
DRAPE U-SHAPE 76X120 STRL (DRAPES) ×2 IMPLANT
DRSG EMULSION OIL 3X3 NADH (GAUZE/BANDAGES/DRESSINGS) ×2 IMPLANT
DRSG OPSITE 6X11 MED (GAUZE/BANDAGES/DRESSINGS) IMPLANT
DRSG PAD ABDOMINAL 8X10 ST (GAUZE/BANDAGES/DRESSINGS) IMPLANT
DRSG TEGADERM 4X10 (GAUZE/BANDAGES/DRESSINGS) IMPLANT
ELECT COATED BLADE 2.86 ST (ELECTRODE) ×2 IMPLANT
ELECT REM PT RETURN 9FT ADLT (ELECTROSURGICAL) ×2
ELECTRODE REM PT RTRN 9FT ADLT (ELECTROSURGICAL) ×1 IMPLANT
GAUZE SPONGE 4X4 16PLY XRAY LF (GAUZE/BANDAGES/DRESSINGS) ×2 IMPLANT
GLOVE BIO SURGEON STRL SZ 6.5 (GLOVE) ×6 IMPLANT
GLOVE ECLIPSE 6.5 STRL STRAW (GLOVE) ×2 IMPLANT
GOWN PREVENTION PLUS XLARGE (GOWN DISPOSABLE) ×6 IMPLANT
GRAFT DERMACARRIERS 1 TO 1.5 (DISPOSABLE) IMPLANT
NEEDLE HYPO 25X1 1.5 SAFETY (NEEDLE) ×2 IMPLANT
NS IRRIG 1000ML POUR BTL (IV SOLUTION) ×2 IMPLANT
PACK BASIN DAY SURGERY FS (CUSTOM PROCEDURE TRAY) ×4 IMPLANT
PAD CAST 3X4 CTTN HI CHSV (CAST SUPPLIES) IMPLANT
PAD CAST 4YDX4 CTTN HI CHSV (CAST SUPPLIES) IMPLANT
PADDING CAST ABS 4INX4YD NS (CAST SUPPLIES)
PADDING CAST ABS COTTON 4X4 ST (CAST SUPPLIES) IMPLANT
PADDING CAST COTTON 3X4 STRL (CAST SUPPLIES)
PADDING CAST COTTON 4X4 STRL (CAST SUPPLIES)
PENCIL BUTTON HOLSTER BLD 10FT (ELECTRODE) ×2 IMPLANT
SHEET MEDIUM DRAPE 40X70 STRL (DRAPES) IMPLANT
SPONGE GAUZE 4X4 12PLY (GAUZE/BANDAGES/DRESSINGS) ×4 IMPLANT
SPONGE LAP 18X18 X RAY DECT (DISPOSABLE) IMPLANT
STAPLER VISISTAT 35W (STAPLE) IMPLANT
STOCKINETTE 4X48 STRL (DRAPES) IMPLANT
STOCKINETTE 6  STRL (DRAPES)
STOCKINETTE 6 STRL (DRAPES) IMPLANT
STRIP CLOSURE SKIN 1/2X4 (GAUZE/BANDAGES/DRESSINGS) IMPLANT
SUCTION FRAZIER TIP 10 FR DISP (SUCTIONS) ×2 IMPLANT
SURGILUBE 2OZ TUBE FLIPTOP (MISCELLANEOUS) IMPLANT
SUT SILK 3 0 PS 1 (SUTURE) IMPLANT
SUT SILK 4 0 PS 2 (SUTURE) IMPLANT
SUT VIC AB 5-0 P-3 18X BRD (SUTURE) ×5 IMPLANT
SUT VIC AB 5-0 P3 18 (SUTURE) ×5
SUT VICRYL 4-0 PS2 18IN ABS (SUTURE) IMPLANT
SYR BULB 3OZ (MISCELLANEOUS) ×2 IMPLANT
SYR CONTROL 10ML LL (SYRINGE) ×2 IMPLANT
TOWEL OR 17X24 6PK STRL BLUE (TOWEL DISPOSABLE) ×2 IMPLANT
TRAY DSU PREP LF (CUSTOM PROCEDURE TRAY) ×2 IMPLANT
TUBE CONNECTING 20X1/4 (TUBING) ×2 IMPLANT
UNDERPAD 30X30 INCONTINENT (UNDERPADS AND DIAPERS) IMPLANT
WATER STERILE IRR 1000ML POUR (IV SOLUTION) IMPLANT
YANKAUER SUCT BULB TIP NO VENT (SUCTIONS) ×2 IMPLANT

## 2012-05-27 NOTE — Anesthesia Procedure Notes (Signed)
Procedure Name: LMA Insertion Date/Time: 05/27/2012 2:29 PM Performed by: Zenia Resides D Pre-anesthesia Checklist: Patient identified, Emergency Drugs available, Suction available, Patient being monitored and Timeout performed Patient Re-evaluated:Patient Re-evaluated prior to inductionOxygen Delivery Method: Circle System Utilized Preoxygenation: Pre-oxygenation with 100% oxygen Intubation Type: IV induction Ventilation: Mask ventilation without difficulty LMA: LMA flexible inserted LMA Size: 5.0 Number of attempts: 1 Airway Equipment and Method: bite block Placement Confirmation: positive ETCO2 and breath sounds checked- equal and bilateral Tube secured with: Tape Dental Injury: Teeth and Oropharynx as per pre-operative assessment

## 2012-05-27 NOTE — Brief Op Note (Signed)
05/27/2012  3:42 PM  PATIENT:  [name] [name]  78 y.o. male  PRE-OPERATIVE DIAGNOSIS: Upper lip defect secondary to excision of basal cell carcinoma  POST-OPERATIVE DIAGNOSIS: Upper lip defect secondary to excision of basal cell carcinoma  PROCEDURE:  Procedure(s) (LRB): Repair of complete upper lip with mucosal advancement  SURGEON:  Surgeon(s) and Role:    * [employer], DO - Primary  PHYSICIAN ASSISTANT: Shawn Rayburn, PA  ASSISTANTS: none   ANESTHESIA:   local and general  EBL:  Total I/O In: 1000 [I.V.:1000] Out: -   BLOOD ADMINISTERED:none  DRAINS: none   LOCAL MEDICATIONS USED:  LIDOCAINE   SPECIMEN:  No Specimen  DISPOSITION OF SPECIMEN:  N/A  COUNTS:  YES  TOURNIQUET:  * No tourniquets in log *  DICTATION: dictated  PLAN OF CARE: Discharge to home after PACU  PATIENT DISPOSITION:  PACU - hemodynamically stable.   Delay start of Pharmacological VTE agent (>24hrs) due to surgical blood loss or risk of bleeding: no

## 2012-05-27 NOTE — Interval H&P Note (Signed)
History and Physical Interval Note:  05/27/2012 1:50 PM  [name] [name]  has presented today for surgery, with the diagnosis of basal cell carcinoma of the upper lip excised by Moh's technique.  The various methods of treatment have been discussed with the patient and family. After consideration of risks, benefits and other options for treatment, the patient has consented to  Procedure(s) (LRB): Repair of the upper lip with tissue advancement as a surgical intervention.  The patient's history has been reviewed, patient examined, no change in status, stable for surgery.  I have reviewed the patients' chart and labs.  Questions were answered to the patient's satisfaction.  Consent signed and risks reviewed which include bleeding, pain, scar and risk of anesthesia.   SANGER,CLAIRE

## 2012-05-27 NOTE — Transfer of Care (Signed)
Immediate Anesthesia Transfer of Care Note  Patient: [name] [name]  Procedure(s) Performed: Procedure(s) (LRB): EXCISION MASS (N/A)  Patient Location: PACU  Anesthesia Type: General  Level of Consciousness: awake, alert  and oriented  Airway & Oxygen Therapy: Patient Spontanous Breathing and Patient connected to nasal cannula oxygen  Post-op Assessment: Report given to PACU RN and Post -op Vital signs reviewed and stable  Post vital signs: Reviewed and stable  Complications: No apparent anesthesia complications

## 2012-05-27 NOTE — H&P (View-Only) (Signed)
   History and Physical Yer Bremer   05/20/2012 8:00 AM Initial consult  MRN: [id number]   Department: [facility]  Dept Phone: [phone]   Description: Male DOB: 08/06/1931  Provider: Claire Sanger, DO   Primary Coverage   Payor Plan Sponsor Code Group Number Group Name    [employer] MCR ADVANTAGE AARP MEDICARE COMPLETE           Diagnoses     Basal cell carcinoma of lip   - Primary    173.01      Dx: Basal cell carcinoma of skin of lip [173.01]    Vitals - Last Recorded     BP Ht Wt BMI     129/62  1.727 m (5' 8")  83.915 kg (185 lb)  28.13 kg/m2         Subjective:    Patient ID: [name] [name] is a 78 y.o. male  The patient is a 78 yrs old wm male here with his wife for evaluation of an upper lip defect.  On May 16, 2012 he had an excision of the skin, vermillion border and a portion of the obicularis muscle by Moh's technique.  The pathology showed a basal cell carcinoma and the margins are now negative. The defect is 1.5 x 2.5.  He states the area was small and had been growing over the past several years.  It would get large, fall off and then grow again.  He has also has a history of bladder cancer.  He is currently placing antibiotic ointment and a gauze on the area.  He is able to pucker the muscle at this time.   The following portions of the patient's history were reviewed and updated as appropriate: allergies, current medications, past family history, past medical history, past social history, past surgical history and problem list.   Review of Systems  Constitutional: Negative.   HENT: Negative.   Eyes: Negative.   Respiratory: Negative.   Cardiovascular: Negative.   Gastrointestinal: Negative.   Genitourinary: Negative.   Musculoskeletal: Negative.   Skin: Positive for color change and wound.  Neurological: Negative.   Hematological: Negative.   Psychiatric/Behavioral: Negative.         Objective:    Physical Exam  Constitutional: He appears  well-developed and well-nourished.  HENT:   Head: Normocephalic and atraumatic.   Mouth/Throat:  Eyes: Conjunctivae normal and EOM are normal. Pupils are equal, round, and reactive to light.  Neck: Normal range of motion.  Cardiovascular: Normal rate.   Pulmonary/Chest: Effort normal. No respiratory distress. He has no wheezes.  Abdominal: Soft. He exhibits no distension. There is no tenderness.  Neurological: He is alert.  Skin: Skin is warm.  Psychiatric: He has a normal mood and affect. His behavior is normal. Judgment and thought content normal.  Assessment:    1.  Basal cell carcinoma of lip    Plan:   Recommend repair with reapproximation of the obicularis muscle and likely a rotation, advancement or lip switch repair with Acell placement.  Risks and complications were reviewed and include bleeding, pain, scar and risk of anesthesia.  The defect is not full thickness and may be able to be repaired with a vermillion lip switch.                       

## 2012-05-27 NOTE — Anesthesia Postprocedure Evaluation (Signed)
  Anesthesia Post-op Note  Patient: [name] [name]  Procedure(s) Performed: Procedure(s) (LRB): EXCISION MASS (N/A)  Patient Location: PACU  Anesthesia Type: General  Level of Consciousness: awake, alert  and oriented  Airway and Oxygen Therapy: Patient Spontanous Breathing and Patient connected to face mask oxygen  Post-op Pain: mild  Post-op Assessment: Post-op Vital signs reviewed  Post-op Vital Signs: Reviewed  Complications: No apparent anesthesia complications

## 2012-05-27 NOTE — Anesthesia Preprocedure Evaluation (Signed)
Anesthesia Evaluation  Patient identified by MRN, date of birth, ID band Patient awake    Reviewed: Allergy & Precautions, H&P , NPO status , Patient's Chart, lab work & pertinent test results, reviewed documented beta blocker date and time   Airway Mallampati: II TM Distance: >3 FB Neck ROM: full    Dental   Pulmonary neg pulmonary ROS,          Cardiovascular hypertension, Pt. on medications and Pt. on home beta blockers + CAD, + CABG and + Peripheral Vascular Disease     Neuro/Psych negative neurological ROS  negative psych ROS   GI/Hepatic negative GI ROS, Neg liver ROS,   Endo/Other  negative endocrine ROS  Renal/GU negative Renal ROS  negative genitourinary   Musculoskeletal   Abdominal   Peds  Hematology negative hematology ROS (+)   Anesthesia Other Findings See surgeon's H&P   Reproductive/Obstetrics negative OB ROS                           Anesthesia Physical Anesthesia Plan  ASA: III  Anesthesia Plan: General   Post-op Pain Management:    Induction: Intravenous  Airway Management Planned: LMA  Additional Equipment:   Intra-op Plan:   Post-operative Plan: Extubation in OR  Informed Consent: I have reviewed the patients History and Physical, chart, labs and discussed the procedure including the risks, benefits and alternatives for the proposed anesthesia with the patient or authorized representative who has indicated his/her understanding and acceptance.   Dental Advisory Given  Plan Discussed with: CRNA and Surgeon  Anesthesia Plan Comments:         Anesthesia Quick Evaluation

## 2012-05-28 NOTE — Op Note (Signed)
NAME:  [name] [name], [name] [name]             ACCOUNT NO.:  [id number]  MEDICAL RECORD NO.:  [id number]  LOCATION:                                 FACILITY:  PHYSICIAN:  Wayland Denis, DO      DATE OF BIRTH:  [birth year]  DATE OF PROCEDURE:  05/27/2012 DATE OF DISCHARGE:                              OPERATIVE REPORT   PREOPERATIVE DIAGNOSIS:  Upper lip defect, secondary to basal cell carcinoma excision by Mohs technique.  POSTOPERATIVE DIAGNOSIS:  Upper lip defect, secondary to basal cell carcinoma excision by Mohs technique.  PROCEDURE:  Repair of upper lip defect, secondary to basal cell carcinoma excision by Mohs procedure, with mucosal advancement.  Total lip repair.  ATTENDING:  Wayland Denis, DO.  ASSISTANT:  Shawn Rayburn, PA.  ANESTHESIA:  General.  INDICATION FOR PROCEDURE:  The patient is a 78 year old male who had a basal cell carcinoma diagnosed and underwent excision by Mohs technique and had complete deficit of the red portion of his upper lip.  Muscle and most of the mucosa was intact.  The patient refused a lip-switch or Abbe flap and wanted repair the most simple way possible.  Risks and complications were reviewed including a small lip esthetic appearance, bleeding pain, scar, risk of anesthesia, contracture, and oral incompetence and the patient wished to proceed.  Consent was confirmed.  DESCRIPTION OF PROCEDURE:  The patient was taken to the operating room and placed on the operating room table in supine position.  General anesthesia was administered.  Once adequate, a time-out was called and all information was confirmed to be correct.  He was prepped and draped in the usual sterile fashion.  Lidocaine 1% with epinephrine was injected around the upper lip to help with intraoperative hemostasis. The small tenotomy scissors were used to free the mucosa of the upper- inner buccal mucosa.  The frenulum was tight and a V-Y advancement was done to help advance that  part of the mucosa.  The Bovie was used to do that and then it was sutured with vertical mattress sutures using 5-0 Vicryl.  Once the oral mucosa was advanced, it was sutured to the skin using horizontal mattress sutures.  There was slight bit of excess in the medial aspect and the dog-ear was excised and it was closed on the buccal side.  Triple-antibiotic ointment was applied.  The patient tolerated the procedure well. Undermining was also done on the skin side of the upper lip to help free the tissue and decrease the tension of the repair.  The patient was awoken and taken to recovery room in stable condition.     Wayland Denis, DO     CS/MEDQ  D:  05/27/2012  T:  05/28/2012  Job:  [id number]

## 2012-05-30 ENCOUNTER — Encounter (HOSPITAL_BASED_OUTPATIENT_CLINIC_OR_DEPARTMENT_OTHER): Payer: Self-pay | Admitting: Plastic Surgery

## 2012-07-14 ENCOUNTER — Other Ambulatory Visit: Payer: Self-pay | Admitting: Cardiology

## 2012-12-10 ENCOUNTER — Ambulatory Visit (INDEPENDENT_AMBULATORY_CARE_PROVIDER_SITE_OTHER): Payer: Medicare Other | Admitting: Cardiology

## 2012-12-10 ENCOUNTER — Encounter: Payer: Self-pay | Admitting: Cardiology

## 2012-12-10 VITALS — BP 126/65 | HR 62 | Wt 190.0 lb

## 2012-12-10 DIAGNOSIS — I2510 Atherosclerotic heart disease of native coronary artery without angina pectoris: Secondary | ICD-10-CM

## 2012-12-10 DIAGNOSIS — I6529 Occlusion and stenosis of unspecified carotid artery: Secondary | ICD-10-CM

## 2012-12-10 NOTE — Assessment & Plan Note (Addendum)
Continue aspirin. Intolerant to statins. Schedule myoview for risk stratification.

## 2012-12-10 NOTE — Progress Notes (Signed)
HPI: [name] [name] is a very pleasant gentleman with a history of coronary artery disease. He has a history of myocardial infarction and coronary artery bypass grafting in 1986 and he had a redo coronary artery bypass grafting in 1996. Cardiac catheterization in June of 2007 revealed normal LV function. His LAD was totally occluded. The LIMA to the LAD was widely patent and there was a 40% stenosis at the insertion into the LAD. The circumflex was totalled and the saphenous vein graft to the circumflex had a 95% stenosis of the proximal portion. The right coronary artery was occluded and the RIMA to the right coronary artery was intact. The patient had a bare metal stent at that time to the saphenous vein graft to the circumflex. Note he had an abdominal ultrasound in June of 2008 that showed no aneurysm. He has also had a history of a carotid endarterectomy and his most recent carotid Doppler performed in Jan 2013 showed a 40% to 59% bilateral stenosis. Followup recommended in one year. Last Myoview was performed in Feb, 2011 showed normal perfusion. Ejection fraction was 62%. I last saw him in Dec 2012. Since then the patient denies any dyspnea on exertion, orthopnea, PND, pedal edema, palpitations, syncope or chest pain.   Current Outpatient Prescriptions  Medication Sig Dispense Refill  . allopurinol (ZYLOPRIM) 100 MG tablet Take 100 mg by mouth daily.        Marland Kitchen amLODipine (NORVASC) 10 MG tablet Take 10 mg by mouth daily.        Marland Kitchen aspirin 81 MG tablet Take 81 mg by mouth daily.        . Calcium Carbonate-Vitamin D 600-400 MG-UNIT per tablet Take 1 tablet by mouth daily.        . diphenhydrAMINE (BENADRYL) 25 MG tablet Take 25 mg by mouth daily.        . enalapril (VASOTEC) 10 MG tablet TAKE 1 TABLET DAILY  90 tablet  4  . hydrochlorothiazide (HYDRODIURIL) 25 MG tablet 1 tab po qd.... take an extra tab if swelling increases  60 tablet  3  . metoprolol (LOPRESSOR) 50 MG tablet TAKE 1 TABLET TWICE A  DAY  60 tablet  12  . Omega-3 Fatty Acids (FISH OIL) 1200 MG CAPS Take by mouth.        . predniSONE (DELTASONE) 10 MG tablet Take 10 mg by mouth daily.        . Red Yeast Rice 600 MG TABS Take by mouth 2 (two) times daily.        . Tamsulosin HCl (FLOMAX) 0.4 MG CAPS Take by mouth daily after breakfast.         Past Medical History  Diagnosis Date  . HYPERLIPIDEMIA   . GOUT   . HYPERTENSION   . CAD   . TRANSIENT ISCHEMIC ATTACK   . NEPHROLITHIASIS   . RENAL INSUFFICIENCY   . CONTRAST DYE ALLERGY, HX OF   . Wears glasses   . Wears hearing aid   . Full dentures     Past Surgical History  Procedure Date  . Coronary artery bypass graft 1986  . Carotid endarterectomy   . Coronary artery bypass graft 1996  . Total knee arthroplasty 10/2009    rt  . Mass excision 05/27/2012    Procedure: EXCISION MASS;  Surgeon: Wayland Denis, DO;  Location: [facility];  Service: Plastics;  Laterality: N/A;  repair of upper lip defect pt is post op  excision of  skin cancer with tissue rearrangement or lip switch.    History   Social History  . Marital Status: Married    Spouse Name: N/A    Number of Children: N/A  . Years of Education: N/A   Occupational History  . Not on file.   Social History Main Topics  . Smoking status: Former Smoker    Types: Cigarettes    Quit date: 11/26/1965  . Smokeless tobacco: Not on file  . Alcohol Use: No  . Drug Use: No  . Sexually Active: Not on file   Other Topics Concern  . Not on file   Social History Narrative  . No narrative on file    ROS: some knee pain but no fevers or chills, productive cough, hemoptysis, dysphasia, odynophagia, melena, hematochezia, dysuria, hematuria, rash, seizure activity, orthopnea, PND, pedal edema, claudication. Remaining systems are negative.  Physical Exam: Well-developed well-nourished in no acute distress.  Skin is warm and dry.  HEENT is normal.  Neck is supple.  Chest is clear to  auscultation with normal expansion.  Cardiovascular exam is regular rate and rhythm.  Abdominal exam nontender or distended. No masses palpated. Extremities show no edema. neuro grossly intact  ECG sinus rhythm at a rate of 62. Normal axis. Incomplete right bundle branch block.

## 2012-12-10 NOTE — Assessment & Plan Note (Signed)
Blood pressure controlled. Continue present medications. 

## 2012-12-10 NOTE — Patient Instructions (Addendum)
Your physician wants you to follow-up in: ONE YEAR WITH DR [name] IN [place] You will receive a reminder letter in the mail two months in advance. If you don't receive a letter, please call our office to schedule the follow-up appointment.   Your physician has requested that you have a lexiscan myoview. For further information please visit [email or web address] Please follow instruction sheet, as given.   Your physician has requested that you have a carotid duplex. This test is an ultrasound of the carotid arteries in your neck. It looks at blood flow through these arteries that supply the brain with blood. Allow one hour for this exam. There are no restrictions or special instructions.

## 2012-12-10 NOTE — Assessment & Plan Note (Signed)
Continue diet. Intolerant to statins. 

## 2012-12-10 NOTE — Assessment & Plan Note (Signed)
Continue aspirin. Schedule follow-up carotid Dopplers. 

## 2012-12-30 ENCOUNTER — Encounter: Payer: Self-pay | Admitting: Cardiology

## 2012-12-30 ENCOUNTER — Encounter (INDEPENDENT_AMBULATORY_CARE_PROVIDER_SITE_OTHER): Payer: Medicare Other

## 2012-12-30 DIAGNOSIS — I6529 Occlusion and stenosis of unspecified carotid artery: Secondary | ICD-10-CM

## 2013-01-06 ENCOUNTER — Encounter (HOSPITAL_COMMUNITY): Payer: Medicare Other

## 2013-01-13 ENCOUNTER — Ambulatory Visit (HOSPITAL_COMMUNITY): Payer: Medicare Other | Attending: Cardiovascular Disease | Admitting: Radiology

## 2013-01-13 VITALS — BP 126/74 | HR 58 | Ht 68.0 in | Wt 185.0 lb

## 2013-01-13 DIAGNOSIS — I10 Essential (primary) hypertension: Secondary | ICD-10-CM | POA: Insufficient documentation

## 2013-01-13 DIAGNOSIS — E785 Hyperlipidemia, unspecified: Secondary | ICD-10-CM | POA: Insufficient documentation

## 2013-01-13 DIAGNOSIS — Z8673 Personal history of transient ischemic attack (TIA), and cerebral infarction without residual deficits: Secondary | ICD-10-CM | POA: Insufficient documentation

## 2013-01-13 DIAGNOSIS — I2510 Atherosclerotic heart disease of native coronary artery without angina pectoris: Secondary | ICD-10-CM

## 2013-01-13 DIAGNOSIS — Z951 Presence of aortocoronary bypass graft: Secondary | ICD-10-CM | POA: Insufficient documentation

## 2013-01-13 DIAGNOSIS — I4510 Unspecified right bundle-branch block: Secondary | ICD-10-CM | POA: Insufficient documentation

## 2013-01-13 MED ORDER — REGADENOSON 0.4 MG/5ML IV SOLN
0.4000 mg | Freq: Once | INTRAVENOUS | Status: AC
  Administered 2013-01-13: 0.4 mg via INTRAVENOUS

## 2013-01-13 MED ORDER — TECHNETIUM TC 99M SESTAMIBI GENERIC - CARDIOLITE
33.0000 | Freq: Once | INTRAVENOUS | Status: AC | PRN
  Administered 2013-01-13: 33 via INTRAVENOUS

## 2013-01-13 MED ORDER — TECHNETIUM TC 99M SESTAMIBI GENERIC - CARDIOLITE
11.0000 | Freq: Once | INTRAVENOUS | Status: AC | PRN
  Administered 2013-01-13: 11 via INTRAVENOUS

## 2013-01-13 NOTE — Progress Notes (Signed)
[name] SITE 3 NUCLEAR MED [address] [place], [place] [address] [phone]    Cardiology Nuclear Med Study  [name] [name] is a 79 y.o. male     MRN : [id number]     DOB: 1931-03-14  Procedure Date: 01/13/2013  Nuclear Med Background Indication for Stress Test:  Evaluation for Ischemia and Stent/Graft Patency History:  '86 MI>CABG; '96 Re-do CABG; '05 (R) CEA; '07 PTCA/Stent-SVG>OM/CFX; '11 GEX:BMWUXL, EF=62% Cardiac Risk Factors: Carotid Disease, Family History - CAD, History of Smoking, Hypertension, Lipids, RBBB and TIA  Symptoms:  No cardiac complaints.   Nuclear Pre-Procedure Caffeine/Decaff Intake:  9:30pm NPO After: 9:30pm   Lungs:  Clear. O2 Sat: 98% on room air. IV 0.9% NS with Angio Cath:  22g  IV Site: R Wrist  IV Started by:  Cathlyn Parsons, RN  Chest Size (in):  44 Cup Size: n/a  Height: 5\' 8"  (1.727 m)  Weight:  185 lb (83.915 kg)  BMI:  Body mass index is 28.14 kg/(m^2). Tech Comments:  Lopressor taken at [employer]    Nuclear Med Study 1 or 2 day study: 1 day  Stress Test Type:  Lexiscan  Reading MD: Kristeen Miss, MD  Order Authorizing Provider:  Olga Millers, MD  Resting Radionuclide: Technetium 90m Sestamibi  Resting Radionuclide Dose: 11.0 mCi   Stress Radionuclide:  Technetium 84m Sestamibi  Stress Radionuclide Dose: 33.0 mCi           Stress Protocol Rest HR: 58 Stress HR: 70  Rest BP: 126/74 Stress BP: 129/70  Exercise Time (min): n/a METS: n/a   Predicted Max HR: 141 bpm % Max HR: 49.65 bpm Rate Pressure Product: 9030   Dose of Adenosine (mg):  n/a Dose of Lexiscan: 0.4 mg  Dose of Atropine (mg): n/a Dose of Dobutamine: n/a mcg/kg/min (at max HR)  Stress Test Technologist: Smiley Houseman, CMA-N  Nuclear Technologist:  Domenic Polite, CNMT     Rest Procedure:  Myocardial perfusion imaging was performed at rest 45 minutes following the intravenous administration of Technetium 70m Sestamibi.  Rest ECG: NSR - Normal  EKG  Stress Procedure:  The patient received IV Lexiscan 0.4 mg over 15-seconds.  Technetium 19m Sestamibi injected at 30-seconds.  He did c/o chest tightness with lexiscan bolus.  Quantitative spect images were obtained after a 45 minute delay.  Stress ECG: No significant change from baseline ECG  QPS Raw Data Images:  Normal; no motion artifact; normal heart/lung ratio. Stress Images:  There is a small, moderately severe defect in the basal, inferiolateral  wall    Rest Images:  Normal homogeneous uptake in all areas of the myocardium. Subtraction (SDS):  There is a small reversible defect in the basal inferiolateral wall. Transient Ischemic Dilatation (Normal <1.22):  0.99 Lung/Heart Ratio (Normal <0.45):  0.33  Quantitative Gated Spect Images QGS EDV:  89 ml QGS ESV:  36 ml  Impression Exercise Capacity:  Lexiscan with no exercise. BP Response:  Normal blood pressure response. Clinical Symptoms:  No significant symptoms noted. ECG Impression:  No significant ST segment change suggestive of ischemia. Comparison with Prior Nuclear Study: The small basal inferiolateral defect is new since the previous study 12/27/09.  Overall Impression:  Low risk stress nuclear study.  There is a new small inferiolateral defect that was not present 12/27/09.  The LV function is well preserved.    LV Ejection Fraction: 59%.  LV Wall Motion:  NL LV Function; NL Wall Motion.   Deloris Ping.  Fran Lowes., MD, [facility] 01/13/2013, 5:40 PM Office - [phone] Pager [phone]

## 2013-04-17 ENCOUNTER — Other Ambulatory Visit: Payer: Self-pay | Admitting: Cardiology

## 2013-05-25 ENCOUNTER — Other Ambulatory Visit: Payer: Self-pay | Admitting: Cardiology

## 2013-07-09 ENCOUNTER — Encounter (INDEPENDENT_AMBULATORY_CARE_PROVIDER_SITE_OTHER): Payer: Medicare Other

## 2013-07-09 DIAGNOSIS — I6529 Occlusion and stenosis of unspecified carotid artery: Secondary | ICD-10-CM

## 2013-07-23 ENCOUNTER — Other Ambulatory Visit: Payer: Self-pay | Admitting: Cardiology

## 2013-08-26 ENCOUNTER — Ambulatory Visit (INDEPENDENT_AMBULATORY_CARE_PROVIDER_SITE_OTHER): Payer: Medicare Other | Admitting: Cardiology

## 2013-08-26 ENCOUNTER — Encounter: Payer: Self-pay | Admitting: Cardiology

## 2013-08-26 VITALS — BP 168/68 | HR 49 | Ht 68.5 in | Wt 190.0 lb

## 2013-08-26 DIAGNOSIS — I10 Essential (primary) hypertension: Secondary | ICD-10-CM

## 2013-08-26 DIAGNOSIS — R079 Chest pain, unspecified: Secondary | ICD-10-CM | POA: Insufficient documentation

## 2013-08-26 DIAGNOSIS — I2510 Atherosclerotic heart disease of native coronary artery without angina pectoris: Secondary | ICD-10-CM

## 2013-08-26 DIAGNOSIS — E785 Hyperlipidemia, unspecified: Secondary | ICD-10-CM

## 2013-08-26 MED ORDER — ENALAPRIL MALEATE 10 MG PO TABS: 10.0000 mg | ORAL_TABLET | Freq: Every day | ORAL | Status: DC

## 2013-08-26 MED ORDER — METOPROLOL TARTRATE 50 MG PO TABS: 25.0000 mg | ORAL_TABLET | Freq: Two times a day (BID) | ORAL | Status: DC

## 2013-08-26 NOTE — Assessment & Plan Note (Signed)
Patient symptoms sound more GI related but they also radiated to his left upper extremity. Plan Myoview for risk stratification. I have also asked him to try Pepcid a.c. To see if this improves his symptoms.

## 2013-08-26 NOTE — Assessment & Plan Note (Signed)
Intolerant to statins. 

## 2013-08-26 NOTE — Progress Notes (Signed)
HPI: [name] is a very pleasant gentleman with a history of coronary artery disease. He has a history of myocardial infarction and coronary artery bypass grafting in 1986 and he had a redo coronary artery bypass grafting in 1996. Cardiac catheterization in June of 2007 revealed normal LV function. His LAD was totally occluded. The LIMA to the LAD was widely patent and there was a 40% stenosis at the insertion into the LAD. The circumflex was totalled and the saphenous vein graft to the circumflex had a 95% stenosis of the proximal portion. The right coronary artery was occluded and the RIMA to the right coronary artery was intact. The patient had a bare metal stent at that time to the saphenous vein graft to the circumflex. Note he had an abdominal ultrasound in June of 2008 that showed no aneurysm. He has also had a history of a carotid endarterectomy and his most recent carotid Doppler performed in August 2014 showed a 40% to 59%  right and 60-79% left stenosis. Followup recommended in six months. Last Myoview was performed in February 2014. Ejection fraction 59%. Small reversible inferior lateral defect; patient treated medically. I last saw him in Jan 2014. Since then patient has had occasional epigastric pain associated with arm tingling. The pain typically occurs after eating late and then going to bed. Occasional water brash. However he is concerned because it radiates down his arm. He is not having exertional chest pain he denies dyspnea on exertion, orthopnea, PND, pedal edema or syncope.  Current Outpatient Prescriptions  Medication Sig Dispense Refill  . allopurinol (ZYLOPRIM) 100 MG tablet Take 100 mg by mouth daily.        Marland Kitchen amLODipine (NORVASC) 10 MG tablet Take 10 mg by mouth daily.        Marland Kitchen aspirin 81 MG tablet Take 81 mg by mouth daily.        . Calcium Carbonate-Vitamin D 600-400 MG-UNIT per tablet Take 1 tablet by mouth daily.        . diphenhydrAMINE (BENADRYL) 25 MG tablet Take  25 mg by mouth daily.        . enalapril (VASOTEC) 10 MG tablet TAKE 1 TABLET DAILY  90 tablet  2  . hydrochlorothiazide (HYDRODIURIL) 25 MG tablet TAKE 1 TABLET DAILY AND TAKE AN EXTRA TABLET IF SWELLING INCREASES  60 tablet  2  . metoprolol (LOPRESSOR) 50 MG tablet TAKE 1 TABLET TWICE A DAY  60 tablet  7  . Omega-3 Fatty Acids (FISH OIL) 1200 MG CAPS Take by mouth.        . predniSONE (DELTASONE) 10 MG tablet Take 10 mg by mouth daily.        . Red Yeast Rice 600 MG TABS Take by mouth 2 (two) times daily.        . Tamsulosin HCl (FLOMAX) 0.4 MG CAPS Take by mouth daily after breakfast.       No current facility-administered medications for this visit.     Past Medical History  Diagnosis Date  . HYPERLIPIDEMIA   . GOUT   . HYPERTENSION   . CAD   . TRANSIENT ISCHEMIC ATTACK   . NEPHROLITHIASIS   . RENAL INSUFFICIENCY   . CONTRAST DYE ALLERGY, HX OF   . Wears glasses   . Wears hearing aid   . Full dentures     Past Surgical History  Procedure Laterality Date  . Coronary artery bypass graft  1986  . Carotid endarterectomy    .  Coronary artery bypass graft  1996  . Total knee arthroplasty  10/2009    rt  . Mass excision  05/27/2012    Procedure: EXCISION MASS;  Surgeon: Wayland Denis, DO;  Location: [facility];  Service: Plastics;  Laterality: N/A;  repair of upper lip defect pt is post op  excision of skin cancer with tissue rearrangement or lip switch.    History   Social History  . Marital Status: Married    Spouse Name: N/A    Number of Children: N/A  . Years of Education: N/A   Occupational History  . Not on file.   Social History Main Topics  . Smoking status: Former Smoker    Types: Cigarettes    Quit date: 11/26/1965  . Smokeless tobacco: Not on file  . Alcohol Use: No  . Drug Use: No  . Sexual Activity: Not on file   Other Topics Concern  . Not on file   Social History Narrative  . No narrative on file    ROS: no fevers or chills,  productive cough, hemoptysis, dysphasia, odynophagia, melena, hematochezia, dysuria, hematuria, rash, seizure activity, orthopnea, PND, pedal edema, claudication. Remaining systems are negative.  Physical Exam: Well-developed well-nourished in no acute distress.  Skin is warm and dry.  HEENT is normal.  Neck is supple.  Chest is clear to auscultation with normal expansion.  Cardiovascular exam is regular rate and rhythm.  Abdominal exam nontender or distended. No masses palpated. Extremities show no edema. neuro grossly intact  ECG sinus bradycardia at a rate of 49. Incomplete right bundle branch block. Cannot rule out prior inferior infarct.

## 2013-08-26 NOTE — Patient Instructions (Addendum)
Your physician recommends that you schedule a follow-up appointment in: 8 WEEKS WITH DR [name] IN [place]  Your physician has requested that you have en exercise stress myoview. For further information please visit [email or web address] Please follow instruction sheet, as given.   DECREASE METOPROLOL TO 1/2 TABLET TWICE DAILY  START PEPCID AC OVER THE COUNTER

## 2013-08-26 NOTE — Assessment & Plan Note (Signed)
Continue aspirin. Intolerant to statins. Recheck carotid Dopplers February 2015.

## 2013-08-26 NOTE — Assessment & Plan Note (Signed)
Continue aspirin. Intolerant to statins. 

## 2013-08-26 NOTE — Assessment & Plan Note (Signed)
Patient's blood pressure is mildly elevated he states it is normal to low at home. His pulse is decreased. Change metoprolol to 25 mg by mouth twice a day.

## 2013-08-31 ENCOUNTER — Inpatient Hospital Stay (HOSPITAL_COMMUNITY)
Admission: EM | Admit: 2013-08-31 | Discharge: 2013-09-02 | DRG: 247 | Disposition: A | Discharge status: Home / Self Care | Payer: Medicare Other | Attending: Internal Medicine | Admitting: Internal Medicine

## 2013-08-31 ENCOUNTER — Emergency Department (HOSPITAL_COMMUNITY): Payer: Medicare Other

## 2013-08-31 ENCOUNTER — Encounter (HOSPITAL_COMMUNITY): Payer: Self-pay

## 2013-08-31 DIAGNOSIS — T82897A Other specified complication of cardiac prosthetic devices, implants and grafts, initial encounter: Secondary | ICD-10-CM | POA: Diagnosis present

## 2013-08-31 DIAGNOSIS — I6529 Occlusion and stenosis of unspecified carotid artery: Secondary | ICD-10-CM | POA: Diagnosis present

## 2013-08-31 DIAGNOSIS — R079 Chest pain, unspecified: Secondary | ICD-10-CM

## 2013-08-31 DIAGNOSIS — I2510 Atherosclerotic heart disease of native coronary artery without angina pectoris: Secondary | ICD-10-CM | POA: Diagnosis present

## 2013-08-31 DIAGNOSIS — Z96659 Presence of unspecified artificial knee joint: Secondary | ICD-10-CM

## 2013-08-31 DIAGNOSIS — I214 Non-ST elevation (NSTEMI) myocardial infarction: Principal | ICD-10-CM | POA: Diagnosis present

## 2013-08-31 DIAGNOSIS — M109 Gout, unspecified: Secondary | ICD-10-CM

## 2013-08-31 DIAGNOSIS — N200 Calculus of kidney: Secondary | ICD-10-CM

## 2013-08-31 DIAGNOSIS — Z7982 Long term (current) use of aspirin: Secondary | ICD-10-CM

## 2013-08-31 DIAGNOSIS — K130 Diseases of lips: Secondary | ICD-10-CM

## 2013-08-31 DIAGNOSIS — N183 Chronic kidney disease, stage 3 unspecified: Secondary | ICD-10-CM | POA: Diagnosis present

## 2013-08-31 DIAGNOSIS — Z79899 Other long term (current) drug therapy: Secondary | ICD-10-CM

## 2013-08-31 DIAGNOSIS — I25810 Atherosclerosis of coronary artery bypass graft(s) without angina pectoris: Secondary | ICD-10-CM | POA: Diagnosis present

## 2013-08-31 DIAGNOSIS — W19XXXA Unspecified fall, initial encounter: Secondary | ICD-10-CM | POA: Diagnosis present

## 2013-08-31 DIAGNOSIS — I679 Cerebrovascular disease, unspecified: Secondary | ICD-10-CM

## 2013-08-31 DIAGNOSIS — Y849 Medical procedure, unspecified as the cause of abnormal reaction of the patient, or of later complication, without mention of misadventure at the time of the procedure: Secondary | ICD-10-CM | POA: Diagnosis present

## 2013-08-31 DIAGNOSIS — Z87891 Personal history of nicotine dependence: Secondary | ICD-10-CM

## 2013-08-31 DIAGNOSIS — Z01810 Encounter for preprocedural cardiovascular examination: Secondary | ICD-10-CM

## 2013-08-31 DIAGNOSIS — K219 Gastro-esophageal reflux disease without esophagitis: Secondary | ICD-10-CM

## 2013-08-31 DIAGNOSIS — I10 Essential (primary) hypertension: Secondary | ICD-10-CM | POA: Diagnosis present

## 2013-08-31 DIAGNOSIS — H9190 Unspecified hearing loss, unspecified ear: Secondary | ICD-10-CM | POA: Diagnosis present

## 2013-08-31 DIAGNOSIS — S20219A Contusion of unspecified front wall of thorax, initial encounter: Secondary | ICD-10-CM | POA: Diagnosis present

## 2013-08-31 DIAGNOSIS — E785 Hyperlipidemia, unspecified: Secondary | ICD-10-CM | POA: Diagnosis present

## 2013-08-31 DIAGNOSIS — I129 Hypertensive chronic kidney disease with stage 1 through stage 4 chronic kidney disease, or unspecified chronic kidney disease: Secondary | ICD-10-CM | POA: Diagnosis present

## 2013-08-31 DIAGNOSIS — Z91041 Radiographic dye allergy status: Secondary | ICD-10-CM

## 2013-08-31 DIAGNOSIS — G459 Transient cerebral ischemic attack, unspecified: Secondary | ICD-10-CM

## 2013-08-31 DIAGNOSIS — Z9861 Coronary angioplasty status: Secondary | ICD-10-CM

## 2013-08-31 DIAGNOSIS — N259 Disorder resulting from impaired renal tubular function, unspecified: Secondary | ICD-10-CM

## 2013-08-31 DIAGNOSIS — I739 Peripheral vascular disease, unspecified: Secondary | ICD-10-CM | POA: Diagnosis present

## 2013-08-31 DIAGNOSIS — M79609 Pain in unspecified limb: Secondary | ICD-10-CM

## 2013-08-31 DIAGNOSIS — Z8673 Personal history of transient ischemic attack (TIA), and cerebral infarction without residual deficits: Secondary | ICD-10-CM

## 2013-08-31 HISTORY — DX: Chronic kidney disease, stage 3 (moderate): N18.3

## 2013-08-31 HISTORY — DX: Chronic kidney disease, stage 3 unspecified: N18.30

## 2013-08-31 HISTORY — DX: Unspecified hearing loss, unspecified ear: H91.90

## 2013-08-31 LAB — TROPONIN I
Troponin I: <0.3 ng/mL (ref <0.30)
Troponin I: 0.35 ng/mL (ref <0.30)
Troponin I: 0.82 ng/mL (ref <0.30)

## 2013-08-31 LAB — CBC
HCT: 37.7 % — ABNORMAL LOW (ref 39.0–52.0)
MCH: 30.1 pg (ref 26.0–34.0)
MCHC: 34.2 g/dL (ref 30.0–36.0)
Platelets: 182 10*3/uL (ref 150–400)
RBC: 4.29 MIL/uL (ref 4.22–5.81)
RDW: 14.9 % (ref 11.5–15.5)
WBC: 11.8 10*3/uL — ABNORMAL HIGH (ref 4.0–10.5)

## 2013-08-31 LAB — COMPREHENSIVE METABOLIC PANEL
Alkaline Phosphatase: 60 U/L (ref 39–117)
BUN: 26 mg/dL — ABNORMAL HIGH (ref 6–23)
Calcium: 9.9 mg/dL (ref 8.4–10.5)
Creatinine, Ser: 1.66 mg/dL — ABNORMAL HIGH (ref 0.50–1.35)
GFR calc Af Amer: 43 mL/min — ABNORMAL LOW (ref >90)
Glucose, Bld: 149 mg/dL — ABNORMAL HIGH (ref 70–99)
Potassium: 4.2 mEq/L (ref 3.5–5.1)
Total Protein: 6.9 g/dL (ref 6.0–8.3)

## 2013-08-31 LAB — CBC WITH DIFFERENTIAL/PLATELET
Basophils Absolute: 0 10*3/uL (ref 0.0–0.1)
Eosinophils Absolute: 0.1 10*3/uL (ref 0.0–0.7)
Lymphs Abs: 6.9 10*3/uL — ABNORMAL HIGH (ref 0.7–4.0)
MCH: 30.4 pg (ref 26.0–34.0)
MCHC: 34.4 g/dL (ref 30.0–36.0)
MCV: 88.2 fL (ref 78.0–100.0)
Monocytes Absolute: 0.4 10*3/uL (ref 0.1–1.0)
Neutro Abs: 4.6 10*3/uL (ref 1.7–7.7)
Neutrophils Relative %: 37 % — ABNORMAL LOW (ref 43–77)
Platelets: 196 10*3/uL (ref 150–400)
RDW: 15 % (ref 11.5–15.5)

## 2013-08-31 LAB — CREATININE, SERUM
GFR calc Af Amer: 48 mL/min — ABNORMAL LOW (ref >90)
GFR calc non Af Amer: 41 mL/min — ABNORMAL LOW (ref >90)

## 2013-08-31 LAB — PATHOLOGIST SMEAR REVIEW

## 2013-08-31 LAB — HEPARIN LEVEL (UNFRACTIONATED): Heparin Unfractionated: 0.27 IU/mL — ABNORMAL LOW (ref 0.30–0.70)

## 2013-08-31 MED ORDER — HEPARIN (PORCINE) IN NACL 100-0.45 UNIT/ML-% IJ SOLN
1200.0000 [IU]/h | INTRAMUSCULAR | Status: DC
  Administered 2013-08-31: 1000 [IU]/h via INTRAVENOUS
  Filled 2013-08-31 (×3): qty 250

## 2013-08-31 MED ORDER — SODIUM CHLORIDE 0.9 % IJ SOLN: 3.0000 mL | Freq: Two times a day (BID) | INTRAMUSCULAR | Status: DC

## 2013-08-31 MED ORDER — ZOLPIDEM TARTRATE 5 MG PO TABS: 5.0000 mg | ORAL_TABLET | Freq: Every evening | ORAL | Status: DC | PRN

## 2013-08-31 MED ORDER — PREDNISONE 10 MG PO TABS
60.0000 mg | ORAL_TABLET | ORAL | Status: AC
  Administered 2013-09-01: 08:00:00 60 mg via ORAL
  Filled 2013-08-31: qty 1

## 2013-08-31 MED ORDER — GI COCKTAIL ~~LOC~~
30.0000 mL | Freq: Four times a day (QID) | ORAL | Status: DC | PRN
  Filled 2013-08-31: qty 30

## 2013-08-31 MED ORDER — SODIUM CHLORIDE 0.9 % IJ SOLN: 3.0000 mL | INTRAMUSCULAR | Status: DC | PRN

## 2013-08-31 MED ORDER — SODIUM CHLORIDE 0.9 % IV SOLN: 250.0000 mL | INTRAVENOUS | Status: DC | PRN

## 2013-08-31 MED ORDER — MORPHINE SULFATE 2 MG/ML IJ SOLN: 2.0000 mg | INTRAMUSCULAR | Status: DC | PRN

## 2013-08-31 MED ORDER — ASPIRIN 81 MG PO CHEW
81.0000 mg | CHEWABLE_TABLET | ORAL | Status: AC
  Administered 2013-09-01: 81 mg via ORAL
  Filled 2013-08-31: qty 1

## 2013-08-31 MED ORDER — FAMOTIDINE 20 MG PO TABS
20.0000 mg | ORAL_TABLET | ORAL | Status: AC
  Administered 2013-08-31: 20 mg via ORAL
  Filled 2013-08-31: qty 1

## 2013-08-31 MED ORDER — RED YEAST RICE 600 MG PO TABS: 1200.0000 mg | ORAL_TABLET | Freq: Every day | ORAL | Status: DC

## 2013-08-31 MED ORDER — AMLODIPINE BESYLATE 10 MG PO TABS
10.0000 mg | ORAL_TABLET | Freq: Every day | ORAL | Status: DC
  Administered 2013-08-31 – 2013-09-02 (×3): 10 mg via ORAL
  Filled 2013-08-31 (×4): qty 1

## 2013-08-31 MED ORDER — PANTOPRAZOLE SODIUM 40 MG PO TBEC
40.0000 mg | DELAYED_RELEASE_TABLET | Freq: Every day | ORAL | Status: DC
  Administered 2013-08-31 – 2013-09-02 (×3): 40 mg via ORAL
  Filled 2013-08-31 (×2): qty 1

## 2013-08-31 MED ORDER — HYDROCHLOROTHIAZIDE 25 MG PO TABS
25.0000 mg | ORAL_TABLET | Freq: Every day | ORAL | Status: DC | PRN
  Filled 2013-08-31: qty 1

## 2013-08-31 MED ORDER — PREDNISONE 50 MG PO TABS
60.0000 mg | ORAL_TABLET | ORAL | Status: AC
  Administered 2013-08-31: 21:00:00 60 mg via ORAL
  Filled 2013-08-31: qty 1

## 2013-08-31 MED ORDER — ALPRAZOLAM 0.25 MG PO TABS: 0.2500 mg | ORAL_TABLET | Freq: Two times a day (BID) | ORAL | Status: DC | PRN

## 2013-08-31 MED ORDER — ASPIRIN EC 81 MG PO TBEC
81.0000 mg | DELAYED_RELEASE_TABLET | Freq: Every day | ORAL | Status: DC
  Administered 2013-08-31: 81 mg via ORAL
  Filled 2013-08-31: qty 1

## 2013-08-31 MED ORDER — FAMOTIDINE 10 MG PO TABS
10.0000 mg | ORAL_TABLET | Freq: Every day | ORAL | Status: DC | PRN
  Filled 2013-08-31: qty 1

## 2013-08-31 MED ORDER — ALLOPURINOL 100 MG PO TABS
100.0000 mg | ORAL_TABLET | Freq: Every day | ORAL | Status: DC | PRN
  Filled 2013-08-31: qty 1

## 2013-08-31 MED ORDER — FAMOTIDINE 10 MG PO CHEW: 10.0000 mg | CHEWABLE_TABLET | Freq: Every day | ORAL | Status: DC | PRN

## 2013-08-31 MED ORDER — TAMSULOSIN HCL 0.4 MG PO CAPS
0.4000 mg | ORAL_CAPSULE | Freq: Every day | ORAL | Status: DC | PRN
  Filled 2013-08-31: qty 1

## 2013-08-31 MED ORDER — ONDANSETRON HCL 4 MG/2ML IJ SOLN: 4.0000 mg | Freq: Four times a day (QID) | INTRAMUSCULAR | Status: DC | PRN

## 2013-08-31 MED ORDER — SODIUM CHLORIDE 0.9 % IV SOLN: 1.0000 mL/kg/h | INTRAVENOUS | Status: DC

## 2013-08-31 MED ORDER — DIPHENHYDRAMINE HCL 25 MG PO TABS: 25.0000 mg | ORAL_TABLET | Freq: Every day | ORAL | Status: DC

## 2013-08-31 MED ORDER — HEPARIN SODIUM (PORCINE) 5000 UNIT/ML IJ SOLN
5000.0000 [IU] | Freq: Three times a day (TID) | INTRAMUSCULAR | Status: DC
  Administered 2013-08-31: 5000 [IU] via SUBCUTANEOUS
  Filled 2013-08-31 (×5): qty 1

## 2013-08-31 MED ORDER — ACETAMINOPHEN 325 MG PO TABS: 650.0000 mg | ORAL_TABLET | ORAL | Status: DC | PRN

## 2013-08-31 MED ORDER — HYDROCHLOROTHIAZIDE 25 MG PO TABS
25.0000 mg | ORAL_TABLET | Freq: Every day | ORAL | Status: DC
  Administered 2013-08-31: 25 mg via ORAL
  Filled 2013-08-31: qty 1

## 2013-08-31 MED ORDER — HEPARIN BOLUS VIA INFUSION
4000.0000 [IU] | Freq: Once | INTRAVENOUS | Status: AC
  Administered 2013-08-31: 4000 [IU] via INTRAVENOUS
  Filled 2013-08-31: qty 4000

## 2013-08-31 MED ORDER — ENALAPRIL MALEATE 10 MG PO TABS
10.0000 mg | ORAL_TABLET | Freq: Every day | ORAL | Status: DC
  Filled 2013-08-31: qty 1

## 2013-08-31 MED ORDER — DIPHENHYDRAMINE HCL 50 MG/ML IJ SOLN
25.0000 mg | INTRAMUSCULAR | Status: AC
  Administered 2013-09-01: 25 mg via INTRAVENOUS
  Filled 2013-08-31: qty 1

## 2013-08-31 MED ORDER — ASPIRIN EC 81 MG PO TBEC
81.0000 mg | DELAYED_RELEASE_TABLET | Freq: Every day | ORAL | Status: DC
  Administered 2013-09-02: 81 mg via ORAL
  Filled 2013-08-31: qty 1

## 2013-08-31 MED ORDER — OMEGA-3-ACID ETHYL ESTERS 1 G PO CAPS
1.0000 g | ORAL_CAPSULE | Freq: Every day | ORAL | Status: DC
  Administered 2013-08-31 – 2013-09-02 (×3): 1 g via ORAL
  Filled 2013-08-31 (×4): qty 1

## 2013-08-31 MED ORDER — METOPROLOL TARTRATE 25 MG PO TABS
25.0000 mg | ORAL_TABLET | Freq: Two times a day (BID) | ORAL | Status: DC
  Administered 2013-08-31 – 2013-09-02 (×4): 25 mg via ORAL
  Filled 2013-08-31 (×7): qty 1

## 2013-08-31 NOTE — ED Notes (Signed)
MD at bedside. 

## 2013-08-31 NOTE — H&P (Signed)
[facility] History and Physical  CRUZE ZINGARO WUJ:[id number] DOB: May 10, 1931    PCP:   Olivia Canter, MD   Chief Complaint: chest pain.  HPI: [name] [name] is an 79 y.o. male with hx of CAD, s/p CABGx4 with redo in 1996, hx of gout, HTN, hyperlipidemia, carotid disease s/p carotid endarterectomy, GERD, presents to the ER with burning chest pain radiating toward his left shoulder and paresthesia of his left arm.  He saw his cardiologist Dr [name] and his betablocker was reduced, along with plan to do a myoview stress test shortly.  Earlier this year, he had a stress test which showed a small inferolateral ischemia, and was treated medically.  He did have a cardiac catheterization in June of 2007 which revealed normal LV function. His LAD was totally occluded. The LIMA to the LAD was widely patent and there was a 40% stenosis at the insertion into the LAD. The circumflex was totalled and the saphenous vein graft to the circumflex had a 95% stenosis of the proximal portion. The right coronary artery was occluded and the RIMA to the right coronary artery was intact. The patient had a bare metal stent at that time to the saphenous vein graft to the circumflex.  In the ER, he was asymptomatic.  Hospitalist was asked to admit him for r/out and to proceed with the nuclear stress test sooner than planned.  With this pain, there were no associated shortness of breath, nausea, vomiting or diahoresis.  It may be have been GI related, and Dr [name] had recommended [facility]. He denied black or bloody stool, fever, or chills.   Rewiew of Systems:  Constitutional: Negative for malaise, fever and chills. No significant weight loss or weight gain Eyes: Negative for eye pain, redness and discharge, diplopia, visual changes, or flashes of light. ENMT: Negative for ear pain, hoarseness, nasal congestion, sinus pressure and sore throat. No headaches; tinnitus, drooling, or problem  swallowing. Cardiovascular: Negative for palpitations, diaphoresis, dyspnea and peripheral edema. ; No orthopnea, PND Respiratory: Negative for cough, hemoptysis, wheezing and stridor. No pleuritic chestpain. Gastrointestinal: Negative for nausea, vomiting, diarrhea, constipation, abdominal pain, melena, blood in stool, hematemesis, jaundice and rectal bleeding.    Genitourinary: Negative for frequency, dysuria, incontinence,flank pain and hematuria; Musculoskeletal: Negative for back pain and neck pain. Negative for swelling and trauma.;  Skin: . Negative for pruritus, rash, abrasions, bruising and skin lesion.; ulcerations Neuro: Negative for headache, lightheadedness and neck stiffness. Negative for weakness, altered level of consciousness , altered mental status, extremity weakness, burning feet, involuntary movement, seizure and syncope.  Psych: negative for anxiety, depression, insomnia, tearfulness, panic attacks, hallucinations, paranoia, suicidal or homicidal ideation    Past Medical History  Diagnosis Date  . HYPERLIPIDEMIA   . GOUT   . HYPERTENSION   . CAD   . TRANSIENT ISCHEMIC ATTACK   . NEPHROLITHIASIS   . RENAL INSUFFICIENCY   . CONTRAST DYE ALLERGY, HX OF   . Wears glasses   . Wears hearing aid   . Full dentures     Past Surgical History  Procedure Laterality Date  . Coronary artery bypass graft  1986  . Carotid endarterectomy    . Coronary artery bypass graft  1996  . Total knee arthroplasty  10/2009    rt  . Mass excision  05/27/2012    Procedure: EXCISION MASS;  Surgeon: Wayland Denis, DO;  Location: [facility];  Service: Plastics;  Laterality: N/A;  repair of upper lip  defect pt is post op  excision of skin cancer with tissue rearrangement or lip switch.    Medications:  HOME MEDS: Prior to Admission medications   Medication Sig Start Date End Date Taking? Authorizing Provider  allopurinol (ZYLOPRIM) 100 MG tablet Take 100 mg by mouth daily  as needed (for flareup).    Yes Historical Provider, MD  amLODipine (NORVASC) 10 MG tablet Take 10 mg by mouth daily.     Yes Historical Provider, MD  aspirin EC 81 MG tablet Take 81 mg by mouth daily.   Yes Historical Provider, MD  diphenhydrAMINE (BENADRYL) 25 MG tablet Take 25 mg by mouth at bedtime.    Yes Historical Provider, MD  enalapril (VASOTEC) 10 MG tablet Take 1 tablet (10 mg total) by mouth daily. 08/26/13  Yes Lewayne Bunting, MD  enalapril (VASOTEC) 10 MG tablet Take 10 mg by mouth at bedtime.   Yes Historical Provider, MD  famotidine (PEPCID AC) 10 MG chewable tablet Chew 10 mg by mouth daily as needed for heartburn.   Yes Historical Provider, MD  hydrochlorothiazide (HYDRODIURIL) 25 MG tablet Take 25-50 mg by mouth daily. Can take 1 extra tablet as needed for edema   Yes Historical Provider, MD  metoprolol (LOPRESSOR) 50 MG tablet Take 0.5 tablets (25 mg total) by mouth 2 (two) times daily. 08/26/13  Yes Lewayne Bunting, MD  Omega-3 Fatty Acids (FISH OIL) 1200 MG CAPS Take 1,200 mg by mouth 2 (two) times daily.    Yes Historical Provider, MD  Red Yeast Rice 600 MG TABS Take 1,200 mg by mouth at bedtime.    Yes Historical Provider, MD  Tamsulosin HCl (FLOMAX) 0.4 MG CAPS Take 0.4 mg by mouth daily as needed (for urination).    Yes Historical Provider, MD     Allergies:  Allergies  Allergen Reactions  . Uloric [Febuxostat] Other (See Comments)    Upper body paralysis-temporary  . Ivp Dye [Iodinated Diagnostic Agents] Other (See Comments)    Patient continued sneezing   . Statins Other (See Comments)    Patient unable to walk    Social History:   reports that he quit smoking about 47 years ago. His smoking use included Cigarettes. He smoked 0.00 packs per day. He does not have any smokeless tobacco history on file. He reports that he does not drink alcohol or use illicit drugs.  Family History: Family History  Problem Relation Age of Onset  . Heart attack Father   .  Heart disease Sister   . Stroke Brother      Physical Exam: Filed Vitals:   08/31/13 0118 08/31/13 0130 08/31/13 0200 08/31/13 0215  BP: 104/59 107/49 128/54 118/50  Pulse: 67 61 62 58  Temp: 97.6 F (36.4 C)     TempSrc: Oral     Resp: 18 15 17 14   SpO2: 98% 98% 98% 98%   Blood pressure 118/50, pulse 58, temperature 97.6 F (36.4 C), temperature source Oral, resp. rate 14, SpO2 98.00%.  GEN:  Pleasant  patient lying in the stretcher in no acute distress; cooperative with exam. PSYCH:  alert and oriented x4; does not appear anxious or depressed; affect is appropriate. HEENT: Mucous membranes pink and anicteric; PERRLA; EOM intact; no cervical lymphadenopathy nor thyromegaly or carotid bruit; no JVD; There were no stridor. Neck is very supple. Breasts:: Not examined CHEST WALL: No tenderness CHEST: Normal respiration, clear to auscultation bilaterally.  HEART: Regular rate and rhythm.  There are no murmur, rub,  or gallops.   BACK: No kyphosis or scoliosis; no CVA tenderness ABDOMEN: soft and non-tender; no masses, no organomegaly, normal abdominal bowel sounds; no pannus; no intertriginous candida. There is no rebound and no distention. Rectal Exam: Not done EXTREMITIES: No bone or joint deformity; age-appropriate arthropathy of the hands and knees; no edema; no ulcerations.  There is no calf tenderness. Genitalia: not examined PULSES: 2+ and symmetric SKIN: Normal hydration no rash or ulceration CNS: Cranial nerves 2-12 grossly intact no focal lateralizing neurologic deficit.  Speech is fluent; uvula elevated with phonation, facial symmetry and tongue midline. DTR are normal bilaterally, cerebella exam is intact, barbinski is negative and strengths are equaled bilaterally.  No sensory loss.   Labs on Admission:  Basic Metabolic Panel:  Recent Labs Lab 08/31/13 0111  NA 138  K 4.2  CL 97  CO2 26  GLUCOSE 149*  BUN 26*  CREATININE 1.66*  CALCIUM 9.9   Liver Function  Tests:  Recent Labs Lab 08/31/13 0111  AST 16  ALT 14  ALKPHOS 60  BILITOT 0.4  PROT 6.9  ALBUMIN 3.9   No results found for this basename: LIPASE, AMYLASE,  in the last 168 hours No results found for this basename: AMMONIA,  in the last 168 hours CBC:  Recent Labs Lab 08/31/13 0111  WBC 12.0*  NEUTROABS PENDING  HGB 12.9*  HCT 37.5*  MCV 88.2  PLT 196   Cardiac Enzymes:  Recent Labs Lab 08/31/13 0112  TROPONINI <0.30    CBG: No results found for this basename: GLUCAP,  in the last 168 hours   Radiological Exams on Admission: Dg Chest 2 View  08/31/2013   *RADIOLOGY REPORT*  Clinical Data: Chest pain.  CHEST - 2 VIEW  Comparison: Chest radiograph Apr 06, 2011  Findings: Cardiac silhouette is unremarkable.  Status post median sternotomy for apparent coronary artery bypass grafting.  Mild chronic interstitial changes without pleural effusions or focal consolidations.  Mild flattening of the hemidiaphragms.  Pulmonary vasculature is unremarkable.  No pneumothorax.  Mild degenerative change of the thoracic spine.  IMPRESSION: Mild chronic emphysematous changes without superimposed acute cardiopulmonary process.   Original Report Authenticated By: Awilda Metro    EKG: Independently reviewed. NSR without any acute ST-T changes.   Assessment/Plan Present on Admission:  . Chest pain . CAD . HYPERLIPIDEMIA . HYPERTENSION . CONTRAST DYE ALLERGY, HX OF . CAROTID ARTERY DISEASE  PLAN: Atypical chest pain in a patient with known severe CAD, s/p CABG with redo, and s/p cardiac stent.  Will admit for r/out.  I have continued his medications.  He is currently pain free and comfortable.  I suspect he will benefit getting a nuclear stress test later today, once r/out and if it can be arranged.  He is stable, full code, and will be admitted to [facility] service.  Thank you so much for asking me to participate in the care of this nice gentleman.  Other plans as per orders.  Code  Status: FULL Unk Lightning, MD. [facility] Pager [phone] 7pm to 7am.  08/31/2013, 3:02 AM

## 2013-08-31 NOTE — Progress Notes (Addendum)
CRITICAL VALUE ALERT  Critical value received:  Trop: 0.82  MD notified (1st page):  Ward Givens, NP   Time of first page:  1339  Responding MD:  Brion Aliment   Time MD responded:  1340

## 2013-08-31 NOTE — Progress Notes (Signed)
Utilization review completed.  

## 2013-08-31 NOTE — Progress Notes (Signed)
PHARMACY FOLLOW UP NOTE   Pharmacy Consult for : Heparin Indication: chest pain/ACS  Dosing Weight: 83 kg  Labs:  Recent Labs  08/31/13 0111 08/31/13 0540 08/31/13 2055  HGB 12.9* 12.9*  --   HCT 37.5* 37.7*  --   PLT 196 182  --   HEPARINUNFRC  --   --  0.27*  CREATININE 1.66* 1.53*  --    Lab Results  Component Value Date   INR 0.96 10/28/2009    Estimated Creatinine Clearance: 40.4 ml/min (by C-G formula based on Cr of 1.53).  Pertinent Medications:  Infusions:  . sodium chloride 1 mL/kg/hr (08/31/13 2000)  . heparin 1,000 Units/hr (08/31/13 1137)    Assessment:  79 y/o male on Heparin via infusion for chest pain and elevated Troponins.    Heparin infusion rate 1000 units/hr.  Heparin level SUBtherapeutic 0.27 units/ml. No bleeding complications noted   Goal:  Heparin level 0.3-0.7 units/ml   Plan: 1. Increase Heparin to 1200 units/hr. 2. Will check Heparin level with AM Labs, Monitor for bleeding complications    Tremeka Helbling, Elisha Headland  Pharm.D 08/31/2013, 9:51 PM

## 2013-08-31 NOTE — ED Notes (Signed)
Pt brought in by EMS ([place]), c/o chest pain & fall on Monday with possible fx ribs

## 2013-08-31 NOTE — Progress Notes (Signed)
ANTICOAGULATION CONSULT NOTE - Initial Consult  Pharmacy Consult for Heparin Indication: chest pain/ACS  Allergies  Allergen Reactions  . Uloric [Febuxostat] Other (See Comments)    Upper body paralysis-temporary  . Ivp Dye [Iodinated Diagnostic Agents] Other (See Comments)    Patient continued sneezing   . Statins Other (See Comments)    Patient unable to walk    Patient Measurements: Height: 5\' 8"  (172.7 cm) Weight: 182 lb 12.8 oz (82.918 kg) IBW/kg (Calculated) : 68.4 Heparin Dosing Weight: 83 kg  Vital Signs: Temp: 97.6 F (36.4 C) (10/06 0349) Temp src: Oral (10/06 0118) BP: 119/60 mmHg (10/06 0955) Pulse Rate: 68 (10/06 0955)  Labs:  Recent Labs  08/31/13 0111 08/31/13 0112 08/31/13 0230 08/31/13 0540  HGB 12.9*  --   --  12.9*  HCT 37.5*  --   --  37.7*  PLT 196  --   --  182  CREATININE 1.66*  --   --  1.53*  TROPONINI  --  <0.30 0.35*  --     Estimated Creatinine Clearance: 40.4 ml/min (by C-G formula based on Cr of 1.53).   Medical History: Past Medical History  Diagnosis Date  . HYPERLIPIDEMIA   . GOUT   . HYPERTENSION   . CAD     a. s/p CABG x 4 1988;  b. s/p redo CABG x 4 1996;  c. 04/2006 Cath/PCI: LM nl, LAD 100p, LCX 100, RCA 100, LIMA->LAD 40 anast, VG->LCX 95p (3.5x24 [employer] BMS), RIMA->RCA ok;  d. 12/2012 MV: smal area of ischemia in basal inferiolateral wall.  . TRANSIENT ISCHEMIC ATTACK   . NEPHROLITHIASIS   . CKD (chronic kidney disease), stage III   . CONTRAST DYE ALLERGY, HX OF   . HOH (hard of hearing)     a. uses hearing aids    Medications:  Prescriptions prior to admission  Medication Sig Dispense Refill  . allopurinol (ZYLOPRIM) 100 MG tablet Take 100 mg by mouth daily as needed (for flareup).       Marland Kitchen amLODipine (NORVASC) 10 MG tablet Take 10 mg by mouth daily.        Marland Kitchen aspirin EC 81 MG tablet Take 81 mg by mouth daily.      . diphenhydrAMINE (BENADRYL) 25 MG tablet Take 25 mg by mouth at bedtime.       . enalapril  (VASOTEC) 10 MG tablet Take 1 tablet (10 mg total) by mouth daily.  90 tablet  4  . enalapril (VASOTEC) 10 MG tablet Take 10 mg by mouth at bedtime.      . famotidine (PEPCID AC) 10 MG chewable tablet Chew 10 mg by mouth daily as needed for heartburn.      . hydrochlorothiazide (HYDRODIURIL) 25 MG tablet Take 25-50 mg by mouth daily. Can take 1 extra tablet as needed for edema      . metoprolol (LOPRESSOR) 50 MG tablet Take 0.5 tablets (25 mg total) by mouth 2 (two) times daily.  60 tablet  12  . Omega-3 Fatty Acids (FISH OIL) 1200 MG CAPS Take 1,200 mg by mouth 2 (two) times daily.       . Red Yeast Rice 600 MG TABS Take 1,200 mg by mouth at bedtime.       . Tamsulosin HCl (FLOMAX) 0.4 MG CAPS Take 0.4 mg by mouth daily as needed (for urination).         Assessment: 79 y.o. male presents with chest pain. Troponin 0.35 - mildly elevated. Note pt  with CrCl ~75ml/min. To start heparin for r/o ACS. Plan for cath tomorrow. CBC stable at baseline.   Goal of Therapy:  Heparin level 0.3-0.7 units/ml Monitor platelets by anticoagulation protocol: Yes   Plan:  1. Heparin IV bolus 4000 units 2. Heparin gtt at 1000 units/hr 3. F/u 8 hr heparin level 4. Daily heparin level and CBC  Christoper Fabian, PharmD, BCPS Clinical pharmacist, pager [phone] 08/31/2013,10:58 AM

## 2013-08-31 NOTE — Progress Notes (Signed)
[facility] PROGRESS NOTE  [name] [name] ZOX:[id number] DOB: 1931/01/13 DOA: 08/31/2013 PCP: Olivia Canter, MD  HPI/Brief narrative 79 year old male with history of CAD, MI, CABG 1986 with redo in 1996, Myoview in February 2014 with EF 59% and small reversible inferior lateral defect-treated medically gout, hypertension, hyperlipidemia-intolerant of statins, carotid disease status post carotid endarterectomy, GERD, recently seen by his cardiologist for epigastric pain associated with left arm tingling and scheduled to have OP stress test on 09/09/13, admitted on 08/31/13 with complaints of epigastric burning pain radiating to left shoulder and back, paresthesia of left upper arm but no diaphoresis or dyspnea. Some relief with sublingual nitroglycerin. EKG did not show acute changes and patient was admitted for further management  Assessment/Plan:  Chest pain/? NSTEMI CAD/CABG with redo - Currently chest pain-free. - Minimally elevated troponin. - Chest x-ray without acute changes. [facility] cardiology consulted for further ischemic workup and management. Patient has been n.p.o. since admission. - Continue aspirin, metoprolol and ACE inhibitors. -DD: GERD. PPI.   Hypertension - Controlled  Stage III chronic kidney disease - Baseline creatinine probably in the 1.6 range. - Follow BMP.  Hyperlipidemia - Intolerant to statins  GERD -PPI   DVT prophylaxis: Heparin  Lines/catheters: PIV Nutrition: NPO  Activity:  Bed rest with BR privileges Code Status: Full Family Communication: D/W spouse at bedside Disposition Plan: Home when medically stable   Consultants:  Cardiology  Procedures:  None  Antibiotics:  None   Subjective: No CP/dyspnea  Objective: Filed Vitals:   08/31/13 0245 08/31/13 0300 08/31/13 0315 08/31/13 0349  BP: 109/88 115/63 116/57 131/47  Pulse: 62 60 61 77  Temp:    97.6 F (36.4 C)  TempSrc:      Resp: 16 18 17 18   Height:    5\' 8"   (1.727 m)  Weight:    82.918 kg (182 lb 12.8 oz)  SpO2: 98% 98% 100% 99%    Intake/Output Summary (Last 24 hours) at 08/31/13 0906 Last data filed at 08/31/13 0800  Gross per 24 hour  Intake      0 ml  Output    360 ml  Net   -360 ml   Filed Weights   08/31/13 0349  Weight: 82.918 kg (182 lb 12.8 oz)     Exam:  General exam: Moderately built and nourished male lying comfortably supine in bed.  Respiratory system: Clear. No increased work of breathing. Cardiovascular system: S1 & S2 heard, RRR. No JVD, murmurs, gallops, clicks or pedal edema.Telemetry: Sinus bradycardia in the 50s-sinus rhythm in the 60s  Gastrointestinal system: Abdomen is nondistended, soft and nontender. Normal bowel sounds heard. Central nervous system: Alert and oriented. No focal neurological deficits. Extremities: Symmetric 5 x 5 power.   Data Reviewed: Basic Metabolic Panel:  Recent Labs Lab 08/31/13 0111 08/31/13 0540  NA 138  --   K 4.2  --   CL 97  --   CO2 26  --   GLUCOSE 149*  --   BUN 26*  --   CREATININE 1.66* 1.53*  CALCIUM 9.9  --    Liver Function Tests:  Recent Labs Lab 08/31/13 0111  AST 16  ALT 14  ALKPHOS 60  BILITOT 0.4  PROT 6.9  ALBUMIN 3.9   No results found for this basename: LIPASE, AMYLASE,  in the last 168 hours No results found for this basename: AMMONIA,  in the last 168 hours CBC:  Recent Labs Lab 08/31/13 0111 08/31/13 0540  WBC 12.0*  11.8*  NEUTROABS 4.6  --   HGB 12.9* 12.9*  HCT 37.5* 37.7*  MCV 88.2 87.9  PLT 196 182   Cardiac Enzymes:  Recent Labs Lab 08/31/13 0112 08/31/13 0230  TROPONINI <0.30 0.35*   BNP (last 3 results) No results found for this basename: PROBNP,  in the last 8760 hours CBG: No results found for this basename: GLUCAP,  in the last 168 hours  No results found for this or any previous visit (from the past 240 hour(s)).    Additional labs: 1. None     Studies: Dg Chest 2 View  08/31/2013   *RADIOLOGY  REPORT*  Clinical Data: Chest pain.  CHEST - 2 VIEW  Comparison: Chest radiograph Apr 06, 2011  Findings: Cardiac silhouette is unremarkable.  Status post median sternotomy for apparent coronary artery bypass grafting.  Mild chronic interstitial changes without pleural effusions or focal consolidations.  Mild flattening of the hemidiaphragms.  Pulmonary vasculature is unremarkable.  No pneumothorax.  Mild degenerative change of the thoracic spine.  IMPRESSION: Mild chronic emphysematous changes without superimposed acute cardiopulmonary process.   Original Report Authenticated By: Awilda Metro        Scheduled Meds: . amLODipine  10 mg Oral Daily  . aspirin EC  81 mg Oral Daily  . enalapril  10 mg Oral QHS  . heparin  5,000 Units Subcutaneous Q8H  . hydrochlorothiazide  25 mg Oral Daily  . metoprolol  25 mg Oral BID  . omega-3 acid ethyl esters  1 g Oral Daily   Continuous Infusions:   Principal Problem:   Chest pain Active Problems:   HYPERLIPIDEMIA   HYPERTENSION   CAD   CAROTID ARTERY DISEASE   CONTRAST DYE ALLERGY, HX OF    Time spent: 45 minutes    [facility]  [facility] Pager [phone].   If 8PM-8AM, please contact night-coverage at [email or web address], password [facility] 08/31/2013, 9:06 AM  LOS: 0 days

## 2013-08-31 NOTE — Progress Notes (Signed)
Notified [name] [name] of first set of troponin at 0.35. Patient has been chest pain free since admission. No new orders received. Will continue to monitor. Earnest Conroy RN

## 2013-08-31 NOTE — ED Provider Notes (Signed)
CSN: [id number]     Arrival date & time 08/31/13  0005 History   First MD Initiated Contact with Patient 08/31/13 0007     Chief Complaint  Patient presents with  . Chest Pain  . Fall   (Consider location/radiation/quality/duration/timing/severity/associated sxs/prior Treatment) HPI Comments: 79 yo male with htn, cad, bypass hx 96 presents with chest pain PTA for 1 hr, resolved 30 min PTA.  Similar to previous episode yesterday that lasted similar and occurred with exertion.  Pt did have a fall recently falling on right ribs however this pain is different and similar to previous episodes.  No diaphoresis or nausea.  No recent cath.  Last stress approx 1 yr ago.  Pt has stress planned for two weeks from now.    Patient is a 79 y.o. male presenting with chest pain and fall. The history is provided by the patient.  Chest Pain Associated symptoms: no abdominal pain, no back pain, no fever, no headache, no shortness of breath and not vomiting   Fall Associated symptoms include chest pain. Pertinent negatives include no abdominal pain, no headaches and no shortness of breath.    Past Medical History  Diagnosis Date  . HYPERLIPIDEMIA   . GOUT   . HYPERTENSION   . CAD   . TRANSIENT ISCHEMIC ATTACK   . NEPHROLITHIASIS   . RENAL INSUFFICIENCY   . CONTRAST DYE ALLERGY, HX OF   . Wears glasses   . Wears hearing aid   . Full dentures    Past Surgical History  Procedure Laterality Date  . Coronary artery bypass graft  1986  . Carotid endarterectomy    . Coronary artery bypass graft  1996  . Total knee arthroplasty  10/2009    rt  . Mass excision  05/27/2012    Procedure: EXCISION MASS;  Surgeon: Wayland Denis, DO;  Location: [facility];  Service: Plastics;  Laterality: N/A;  repair of upper lip defect pt is post op  excision of skin cancer with tissue rearrangement or lip switch.   Family History  Problem Relation Age of Onset  . Heart attack Father   . Heart disease  Sister   . Stroke Brother    History  Substance Use Topics  . Smoking status: Former Smoker    Types: Cigarettes    Quit date: 11/26/1965  . Smokeless tobacco: Not on file  . Alcohol Use: No    Review of Systems  Constitutional: Negative for fever and chills.  HENT: Negative for neck pain and neck stiffness.   Eyes: Negative for visual disturbance.  Respiratory: Negative for shortness of breath.   Cardiovascular: Positive for chest pain.  Gastrointestinal: Negative for vomiting and abdominal pain.  Genitourinary: Negative for dysuria and flank pain.  Musculoskeletal: Negative for back pain.  Skin: Negative for rash.  Neurological: Negative for light-headedness and headaches.    Allergies  Ivp dye; Statins; and Uloric  Home Medications   Current Outpatient Rx  Name  Route  Sig  Dispense  Refill  . allopurinol (ZYLOPRIM) 100 MG tablet   Oral   Take 100 mg by mouth daily.           Marland Kitchen amLODipine (NORVASC) 10 MG tablet   Oral   Take 10 mg by mouth daily.           Marland Kitchen aspirin 81 MG tablet   Oral   Take 81 mg by mouth daily.           Marland Kitchen  Calcium Carbonate-Vitamin D 600-400 MG-UNIT per tablet   Oral   Take 1 tablet by mouth daily.           . diphenhydrAMINE (BENADRYL) 25 MG tablet   Oral   Take 25 mg by mouth daily.           . enalapril (VASOTEC) 10 MG tablet   Oral   Take 1 tablet (10 mg total) by mouth daily.   90 tablet   4   . hydrochlorothiazide (HYDRODIURIL) 25 MG tablet      TAKE 1 TABLET DAILY AND TAKE AN EXTRA TABLET IF SWELLING INCREASES   60 tablet   2   . metoprolol (LOPRESSOR) 50 MG tablet   Oral   Take 0.5 tablets (25 mg total) by mouth 2 (two) times daily.   60 tablet   12   . Omega-3 Fatty Acids (FISH OIL) 1200 MG CAPS   Oral   Take by mouth.           . predniSONE (DELTASONE) 10 MG tablet   Oral   Take 10 mg by mouth daily.           . Red Yeast Rice 600 MG TABS   Oral   Take by mouth 2 (two) times daily.            . Tamsulosin HCl (FLOMAX) 0.4 MG CAPS   Oral   Take by mouth daily after breakfast.          There were no vitals taken for this visit. Physical Exam  Nursing note and vitals reviewed. Constitutional: He is oriented to person, place, and time. He appears well-developed and well-nourished.  HENT:  Head: Normocephalic and atraumatic.  Eyes: Conjunctivae are normal. Right eye exhibits no discharge. Left eye exhibits no discharge.  Neck: Normal range of motion. Neck supple. No tracheal deviation present.  Cardiovascular: Normal rate and regular rhythm.   Pulmonary/Chest: Effort normal and breath sounds normal. He has no rales.  Abdominal: Soft. He exhibits no distension. There is no tenderness. There is no guarding.  Musculoskeletal: He exhibits tenderness (mild right lateral ribs). He exhibits no edema.  Neurological: He is alert and oriented to person, place, and time.  Skin: Skin is warm. No rash noted.  Psychiatric: He has a normal mood and affect.    ED Course  Procedures (including critical care time) Labs Review Labs Reviewed  CBC WITH DIFFERENTIAL - Abnormal; Notable for the following:    WBC 12.0 (*)    Hemoglobin 12.9 (*)    HCT 37.5 (*)    All other components within normal limits  COMPREHENSIVE METABOLIC PANEL - Abnormal; Notable for the following:    Glucose, Bld 149 (*)    BUN 26 (*)    Creatinine, Ser 1.66 (*)    GFR calc non Af Amer 37 (*)    GFR calc Af Amer 43 (*)    All other components within normal limits  TROPONIN I  TROPONIN I   Imaging Review No results found.  MDM  No diagnosis found. Exertional cp with CAD hx.  Pt had asa pta.  No cp in ED.   Date: 08/31/2013  Rate: 76  Rhythm: normal sinus rhythm  QRS Axis: normal  Intervals: QT prolonged  ST/T Wave abnormalities: nonspecific ST changes and nonspecific T wave changes  Conduction Disutrbances:nonspecific intraventricular conduction delay  Narrative Interpretation:   Old EKG  Reviewed: changes noted  Subtle st depression v4/5  Plan  for placement in hospital with high risk hx and recurrent sxs/ exertional component. No pain in ED.  Spoke with hospitailst for tele observation.  Pt okay with plan.   Dg Chest 2 View  08/31/2013   *RADIOLOGY REPORT*  Clinical Data: Chest pain.  CHEST - 2 VIEW  Comparison: Chest radiograph Apr 06, 2011  Findings: Cardiac silhouette is unremarkable.  Status post median sternotomy for apparent coronary artery bypass grafting.  Mild chronic interstitial changes without pleural effusions or focal consolidations.  Mild flattening of the hemidiaphragms.  Pulmonary vasculature is unremarkable.  No pneumothorax.  Mild degenerative change of the thoracic spine.  IMPRESSION: Mild chronic emphysematous changes without superimposed acute cardiopulmonary process.   Original Report Authenticated By: Awilda Metro     Chest pain, CAD, CRF, rib contusion     Enid Skeens, MD 08/31/13 [phone]

## 2013-08-31 NOTE — ED Notes (Signed)
Per EMS, patient has been having CP for 2 weeks, scheduled to have stress test 10/19, took 2 nitro and 3 -325 aspirin at home, then 3 nitro with EMS, but no other side effects

## 2013-08-31 NOTE — Consult Note (Signed)
Patient ID: [name] [name] MRN: [id number] DOB/AGE: [birth year] 77 y.o.  Admit date: 08/31/2013  Primary Physician   Olivia Canter, MD Primary Cardiologist  Dr. [name] Reason for Consultation   Chest pain  HPI: [name] [name] is an 79 year old male with known CAD (CABGx4 1986, redo CABGx4 1996, BMS to SVG-cx 2007), carotid disease (right CEA 2007), HTN, hyperlipidemia, and gout. He saw his primary cardiologist Dr. [name] last week on 10/1. At this appointment he described multiple episodes of SSCP which he described as a burning sensation. He felt that these episodes mostly came on at night after eating a meal, though he also had several episodes that were not associated with a meal. He had occasionally taken both SL NTG and Mylanta at different times for these burning CP episodes, both of which helped with relief. Dr. [name] therefore started him on Pepcid AC at his appointment and scheduled him for a nuclear stress test in 2 weeks time.   Since his appt on 10/1 he has had several episodes of burning CP after meals, but notes the CP would worsen with activity. Yesterday he was working on his truck at home when he began to experience another episode of burning in the center of his chest, rated 4/10. This episode radiated through to his back and down his left arm. This episode felt slightly more severe than his other episodes over the previous few weeks, and he had not had a meal prior to this episode. He went inside and took 3 SL NTG which he feels gave him slight relief. Over the course of the evening the chest burning gradually worsened, and he called EMS and was brought to the ED. No complaints of SOB, orthopnea, PND, or palpitations. He does ride both an outside and stationary bike for exercise, and has not had any chest discomfort while exercising that he can recall. Of note, he has never experienced any cardiac symptoms prior to his bypasses or stenting. His coronary disease was found at these  times from routine testing.    In the hospital his CP has gradually resolved on it's own. Troponins were <0.30 --> 0.35. 3rd set has not yet been drawn. EKG with interventricular conduction delay without ST/T wave changes.    Past Medical History  Diagnosis Date  . HYPERLIPIDEMIA   . GOUT   . HYPERTENSION   . CAD     a. s/p CABG x 4 1988;  b. s/p redo CABG x 4 1996;  c. 04/2006 Cath/PCI: LM nl, LAD 100p, LCX 100, RCA 100, LIMA->LAD 40 anast, VG->LCX 95p (3.5x24 [employer] BMS), RIMA->RCA ok.  . TRANSIENT ISCHEMIC ATTACK   . NEPHROLITHIASIS   . CKD (chronic kidney disease), stage III   . CONTRAST DYE ALLERGY, HX OF   . HOH (hard of hearing)     a. uses hearing aids     Past Surgical History  Procedure Laterality Date  . Coronary artery bypass graft  1986  . Carotid endarterectomy    . Coronary artery bypass graft  1996  . Total knee arthroplasty  10/2009    rt  . Mass excision  05/27/2012    Procedure: EXCISION MASS;  Surgeon: Wayland Denis, DO;  Location: [facility];  Service: Plastics;  Laterality: N/A;  repair of upper lip defect pt is post op  excision of skin cancer with tissue rearrangement or lip switch.    Allergies  Allergen Reactions  . Uloric [Febuxostat] Other (See Comments)  Upper body paralysis-temporary  . Ivp Dye [Iodinated Diagnostic Agents] Other (See Comments)    Patient continued sneezing   . Statins Other (See Comments)    Patient unable to walk    I have reviewed the patient's current medications . amLODipine  10 mg Oral Daily  . aspirin EC  81 mg Oral Daily  . metoprolol  25 mg Oral BID  . omega-3 acid ethyl esters  1 g Oral Daily  . pantoprazole  40 mg Oral Daily     acetaminophen, allopurinol, ALPRAZolam, gi cocktail, morphine injection, ondansetron (ZOFRAN) IV, tamsulosin, zolpidem  Prior to Admission medications   Medication Sig Start Date End Date Taking? Authorizing Provider  allopurinol (ZYLOPRIM) 100 MG tablet Take 100 mg  by mouth daily as needed (for flareup).    Yes Historical Provider, MD  amLODipine (NORVASC) 10 MG tablet Take 10 mg by mouth daily.     Yes Historical Provider, MD  aspirin EC 81 MG tablet Take 81 mg by mouth daily.   Yes Historical Provider, MD  diphenhydrAMINE (BENADRYL) 25 MG tablet Take 25 mg by mouth at bedtime.    Yes Historical Provider, MD  enalapril (VASOTEC) 10 MG tablet Take 1 tablet (10 mg total) by mouth daily. 08/26/13  Yes Lewayne Bunting, MD  enalapril (VASOTEC) 10 MG tablet Take 10 mg by mouth at bedtime.   Yes Historical Provider, MD  famotidine (PEPCID AC) 10 MG chewable tablet Chew 10 mg by mouth daily as needed for heartburn.   Yes Historical Provider, MD  hydrochlorothiazide (HYDRODIURIL) 25 MG tablet Take 25-50 mg by mouth daily. Can take 1 extra tablet as needed for edema   Yes Historical Provider, MD  metoprolol (LOPRESSOR) 50 MG tablet Take 0.5 tablets (25 mg total) by mouth 2 (two) times daily. 08/26/13  Yes Lewayne Bunting, MD  Omega-3 Fatty Acids (FISH OIL) 1200 MG CAPS Take 1,200 mg by mouth 2 (two) times daily.    Yes Historical Provider, MD  Red Yeast Rice 600 MG TABS Take 1,200 mg by mouth at bedtime.    Yes Historical Provider, MD  Tamsulosin HCl (FLOMAX) 0.4 MG CAPS Take 0.4 mg by mouth daily as needed (for urination).    Yes Historical Provider, MD     History   Social History  . Marital Status: Married    Spouse Name: N/A    Number of Children: N/A  . Years of Education: N/A   Occupational History  . Not on file.   Social History Main Topics  . Smoking status: Former Smoker    Types: Cigarettes    Quit date: 11/26/1965  . Smokeless tobacco: Not on file  . Alcohol Use: No  . Drug Use: No  . Sexual Activity: Not on file   Other Topics Concern  . Not on file   Social History Narrative  . No narrative on file    Family Status  Relation Status Death Age  . Father Deceased    Family History  Problem Relation Age of Onset  . Heart attack  Father   . Heart disease Sister   . Stroke Brother      ROS:  Negative for fever, chills, nausea vomiting.   Physical Exam: Blood pressure 119/60, pulse 68, temperature 97.6 F (36.4 C), temperature source Oral, resp. rate 18, height 5\' 8"  (1.727 m), weight 182 lb 12.8 oz (82.918 kg), SpO2 99.00%.  General: Well developed, well nourished male in no acute distress Lungs: Normal breath  sounds. No adventitious sounds.  Heart: RRR S1 S2, no murmurs.    Neck: No carotid bruits. No JVD. Abdomen: Bowel sounds present, abdomen soft and non-tender without masses.  Extremities: 1+ pitting edema right LE. Trace pitting edema left LE.  Neuro: Alert and oriented X 3. No focal deficits noted. Psych:  Good affect, responds appropriately  Labs:   Lab Results  Component Value Date   WBC 11.8* 08/31/2013   HGB 12.9* 08/31/2013   HCT 37.7* 08/31/2013   MCV 87.9 08/31/2013   PLT 182 08/31/2013   No results found for this basename: INR,  in the last 72 hours   Recent Labs Lab 08/31/13 0111 08/31/13 0540  NA 138  --   K 4.2  --   CL 97  --   CO2 26  --   BUN 26*  --   CREATININE 1.66* 1.53*  CALCIUM 9.9  --   PROT 6.9  --   BILITOT 0.4  --   ALKPHOS 60  --   ALT 14  --   AST 16  --   GLUCOSE 149*  --   ALBUMIN 3.9  --    No results found for this basename: mg    Recent Labs  08/31/13 0112 08/31/13 0230  TROPONINI <0.30 0.35*   No results found for this basename: TROPIPOC,  in the last 72 hours No results found for this basename: probnp   Lab Results  Component Value Date   CHOL 155 12/12/2007   HDL 26.9* 12/12/2007   TRIG 209* 12/12/2007   No results found for this basename: DDIMER   No results found for this basename: Lipase,  amylase   No results found for this basename: tsh,  t4total,  freet3,  t46free,  thyroidab   No results found for this basename: VitaminB12,  Folate,  Ferritin,  TIBC,  Iron,  reticctpct    ECG:  NSR 76 bpm. Incomplete RBBB. No ST/T wave changes  suggestive of ischemia. Unchanged from previous EKG on 12/10/12.   Radiology:  Dg Chest 2 View  08/31/2013   *RADIOLOGY REPORT*  Clinical Data: Chest pain.  CHEST - 2 VIEW  Comparison: Chest radiograph Apr 06, 2011  Findings: Cardiac silhouette is unremarkable.  Status post median sternotomy for apparent coronary artery bypass grafting.  Mild chronic interstitial changes without pleural effusions or focal consolidations.  Mild flattening of the hemidiaphragms.  Pulmonary vasculature is unremarkable.  No pneumothorax.  Mild degenerative change of the thoracic spine.  IMPRESSION: Mild chronic emphysematous changes without superimposed acute cardiopulmonary process.   Original Report Authenticated By: Awilda Metro    ASSESSMENT AND PLAN:    Principal Problem:   Chest pain Active Problems:   HYPERLIPIDEMIA   HYPERTENSION   CAD   CAROTID ARTERY DISEASE   CONTRAST DYE ALLERGY, HX OF   Chronic kidney disease (CKD), stage III (moderate)   GERD (gastroesophageal reflux disease)  Patient is an 79 year old male with significant hx CAD who presented to ED complaining of exertional chest burning which started while he was working on his truck.   1. Chest pain - [name] [name] was recently seen by Dr. [name], who recommended a cardiac stress test due to 1-2 week history of chest burning which was typically but not always associated with meals. This was scheduled to be done in 2 weeks. Yesterday, however, he began experiencing chest burning while working on his truck. This was more severe than previous episodes he had been having,  and also radiated through to his back and down his left arm. The CP improved with SL NTG and eventually resolved on it's own with time. He has never had CP similar to this, and in fact has never had chest pain with any of his cardiac events in the past. He exercises fairly regularly riding an outside bike and stationary bike without any CP. 1st set trops was WNL, 2nd set slightly  elevated at 0.35. 3rd set still to be drawn. EKG showed interventricular conduction delay/incomplete RBBB without any ST/T wave changes suggestive of ischemia. EKG was unchanged from 11/2012. Last ischemic work up was done in 11/2012. This was a Lexiscan MPI which showed a small basal inferolateral defect with stress that was new from previous study in 2/11.   As the patient has had increasing episodes of burning CP with activity the past week, along with an episode yesterday which radiated through to this back and down his left arm, a slightly elevated troponin, and a known small basal inferolateral defect on previous stress testing, I believe we should proceed with left heart catheterization.   2. CAD - He has a significant CAD history (see HPI above) for which he sees Dr. [name] regularly and is on a cardiac regimen of amlodipine, ASA, enalapril, HCTZ, and metoprolol. His metoprolol was recently cut in 1/2 by Dr. [name] 2/2 hypotension. He is intolerant of statins, takes red yeast rice.   3. PVD - History of carotid disease with right carotid CEA in 2007. Followed by Dr. [name]. Recently had carotid duplex on 07/10/13 which showed moderate disease with 40-59% RICA and 60-79% LICA. He is on daily 81mg  ASA, intolerant of statins but takes red yeast rice.   4. GERD - The majority of his chest burning episodes sound to be related to GERD, as they have often occurred at night, after a meal, and after lying down. He has previously just taken Mylanta at home for these episodes which he finds somewhat helpful. On 08/26/13 he was started on daily Pepcid AC. He is unsure as to if this has helped or not yet. We could discuss starting a PPI after cath as long as he is not started on Plavix post-cath.    Signed: Donnajean Lopes, Student-PA  Addendum:  Patient seen and examined with [name] [name] and history extensively reviewed both in chart and personally with patient.  He's been experiencing intermittent  "indigestion" described as chest burning w/o assoc Ss, occurring often after meals and while lying in bed.  Yesterday however, he developed recurrent Ss while working on his truck with radiation into his shoulders and down his left arm.  He had no assoc Ss.  Chest pain persisted several hrs last night w/o relief, though NTG did help somewhat.  His initial troponin was normal but subsequent Ti is mildly elevated.  He is currently pain free. Exam: aaox 3, vss, pleasant, nad, neck w/o bruit/jvd, lungs cta, cor rrr soft syst murmur @ base, abd soft nt/nd/bs+x4, ext no cce f/pt/dp 2+ bilat, no fem bruits.   Ss concerning for [place] in gentleman with known severe native CAD and 3 functioning grafts s/p BMS to the VG->LCX(OM).  We will hold his home dose of ACEI and diuretic and hydrate beginning tonight in preparation for diagnostic cath tomorrow.  He has a listed dye allergy and he says that many years ago, with an IVP in the setting of kidney stones, he developed uncontrollable sneezing.  We will prep with prednisone, pepcid,  and benadryl.  Cont asa, bb.  He is not on statin due to prior intolerance.  Nicolasa Ducking, NP 10:57 AM 08/31/2013 Patient seen with [name] [name] and with [name] Givens, NP. Patient has known extensive prior cardiac history. His recent symptoms yesterday afternoon while working on his truck were consistent with coronary ischemia and his second troponin is mildly elevated. I reviewed his EKGs which show no acute changes and show IVCD with terminal slowing suggestive of peri-infarction block. Chest xray shows no acute change. Exam reveals no audible bruits in carotids. Lungs clear. Heart reveals no murmur gallop or rub. Extremities reveal no edema. Good pulses. Agree with assessment and plan.  Cath tomorrow after gentle hydration tonight. Prep for possible dye allergy.

## 2013-09-01 ENCOUNTER — Encounter (HOSPITAL_COMMUNITY)
Admission: EM | Disposition: A | Discharge status: Home / Self Care | Payer: Medicare Other | Source: Home / Self Care | Attending: Internal Medicine

## 2013-09-01 DIAGNOSIS — E785 Hyperlipidemia, unspecified: Secondary | ICD-10-CM

## 2013-09-01 DIAGNOSIS — I517 Cardiomegaly: Secondary | ICD-10-CM

## 2013-09-01 DIAGNOSIS — I214 Non-ST elevation (NSTEMI) myocardial infarction: Secondary | ICD-10-CM

## 2013-09-01 DIAGNOSIS — I10 Essential (primary) hypertension: Secondary | ICD-10-CM

## 2013-09-01 DIAGNOSIS — I25810 Atherosclerosis of coronary artery bypass graft(s) without angina pectoris: Secondary | ICD-10-CM

## 2013-09-01 HISTORY — PX: LEFT HEART CATHETERIZATION WITH CORONARY/GRAFT ANGIOGRAM: SHX5450

## 2013-09-01 LAB — TROPONIN I: Troponin I: 1.04 ng/mL (ref <0.30)

## 2013-09-01 LAB — CBC
HCT: 43.8 % (ref 39.0–52.0)
Hemoglobin: 14.3 g/dL (ref 13.0–17.0)
MCH: 29.1 pg (ref 26.0–34.0)
MCHC: 32.6 g/dL (ref 30.0–36.0)
MCV: 89 fL (ref 78.0–100.0)
RDW: 15.1 % (ref 11.5–15.5)
WBC: 9.1 10*3/uL (ref 4.0–10.5)

## 2013-09-01 LAB — BASIC METABOLIC PANEL
BUN: 27 mg/dL — ABNORMAL HIGH (ref 6–23)
CO2: 25 mEq/L (ref 19–32)
Calcium: 9.6 mg/dL (ref 8.4–10.5)
Chloride: 101 mEq/L (ref 96–112)
Creatinine, Ser: 1.7 mg/dL — ABNORMAL HIGH (ref 0.50–1.35)
GFR calc non Af Amer: 36 mL/min — ABNORMAL LOW (ref >90)
Potassium: 4.3 mEq/L (ref 3.5–5.1)
Sodium: 138 mEq/L (ref 135–145)

## 2013-09-01 LAB — PROTIME-INR: Prothrombin Time: 13 seconds (ref 11.6–15.2)

## 2013-09-01 LAB — POCT ACTIVATED CLOTTING TIME: Activated Clotting Time: 462 seconds

## 2013-09-01 SURGERY — LEFT HEART CATHETERIZATION WITH CORONARY/GRAFT ANGIOGRAM
Anesthesia: LOCAL

## 2013-09-01 MED ORDER — FENTANYL CITRATE 0.05 MG/ML IJ SOLN
INTRAMUSCULAR | Status: AC
  Filled 2013-09-01: qty 2

## 2013-09-01 MED ORDER — LIDOCAINE HCL (PF) 1 % IJ SOLN
INTRAMUSCULAR | Status: AC
  Filled 2013-09-01: qty 30

## 2013-09-01 MED ORDER — BIVALIRUDIN 250 MG IV SOLR
INTRAVENOUS | Status: AC
  Filled 2013-09-01: qty 250

## 2013-09-01 MED ORDER — HEPARIN (PORCINE) IN NACL 2-0.9 UNIT/ML-% IJ SOLN
INTRAMUSCULAR | Status: AC
  Filled 2013-09-01: qty 1000

## 2013-09-01 MED ORDER — NITROGLYCERIN 0.2 MG/ML ON CALL CATH LAB
INTRAVENOUS | Status: AC
  Filled 2013-09-01: qty 1

## 2013-09-01 MED ORDER — SODIUM CHLORIDE 0.9 % IV SOLN: 1.0000 mL/kg/h | INTRAVENOUS | Status: AC

## 2013-09-01 MED ORDER — CLOPIDOGREL BISULFATE 75 MG PO TABS
75.0000 mg | ORAL_TABLET | Freq: Every day | ORAL | Status: DC
  Administered 2013-09-02: 09:00:00 75 mg via ORAL
  Filled 2013-09-01: qty 1

## 2013-09-01 MED ORDER — MIDAZOLAM HCL 2 MG/2ML IJ SOLN
INTRAMUSCULAR | Status: AC
  Filled 2013-09-01: qty 2

## 2013-09-01 MED ORDER — CLOPIDOGREL BISULFATE 300 MG PO TABS
ORAL_TABLET | ORAL | Status: AC
  Filled 2013-09-01: qty 2

## 2013-09-01 NOTE — Progress Notes (Signed)
[facility] PROGRESS NOTE  [name] [name]:[id number] DOB: 1931/04/04 DOA: 08/31/2013 PCP: Olivia Canter, MD  HPI/Brief narrative 79 year old male with history of CAD, MI, CABG 1986 with redo in 1996, Myoview in February 2014 with EF 59% and small reversible inferior lateral defect-treated medically gout, hypertension, hyperlipidemia-intolerant of statins, carotid disease status post carotid endarterectomy, GERD, recently seen by his cardiologist for epigastric pain associated with left arm tingling and scheduled to have OP stress test on 09/09/13, admitted on 08/31/13 with complaints of epigastric burning pain radiating to left shoulder and back, paresthesia of left upper arm but no diaphoresis or dyspnea. Some relief with sublingual nitroglycerin. EKG did not show acute changes and patient was admitted for further management  Assessment/Plan:  NSTEMI CAD/CABG with redo - Currently chest pain-free. - Minimally elevated troponin. Peak troponin: 1.04 - Chest x-ray without acute changes. Corinda Gubler cardiology input appreciated. - Continue aspirin, metoprolol and ACE inhibitors. - DD: GERD. PPI.  - Status post cath on 09/01/13: Significant in-stent restenosis of saphenous vein graft to the first and second obtuse marginal vessels: Successfully stented with drug-eluting stent. Continue dual antiplatelet therapy for one year  Hypertension - Controlled  Stage III chronic kidney disease - Baseline creatinine probably in the 1.6 range. - Follow BMP.  Hyperlipidemia - Intolerant to statins  GERD -PPI   DVT prophylaxis: Heparin  Lines/catheters: PIV Nutrition: NPO for cardiac cath this morning Activity:  Bed rest with BR privileges Code Status: Full Family Communication: D/W spouse at bedside Disposition Plan: Home when medically stable   Consultants:  Cardiology  Procedures:  Cardiac cath 09/01/13  Antibiotics:  None   Subjective: Patient was seen prior to  cath-denied complaints. No chest pain or dyspnea. Complained of left shoulder discomfort.  Objective: Filed Vitals:   08/31/13 0955 08/31/13 1243 08/31/13 2058 09/01/13 0650  BP: 119/60 120/67 113/58 152/68  Pulse: 68 57 66 74  Temp:  98.1 F (36.7 C) 98.3 F (36.8 C) 97.7 F (36.5 C)  TempSrc:  Oral Oral Oral  Resp:  17 18 18   Height:      Weight:      SpO2:  96% 97% 98%    Intake/Output Summary (Last 24 hours) at 09/01/13 0840 Last data filed at 09/01/13 0544  Gross per 24 hour  Intake  373.6 ml  Output    850 ml  Net -476.4 ml   Filed Weights   08/31/13 0349  Weight: 82.918 kg (182 lb 12.8 oz)     Exam:  General exam: Moderately built and nourished male lying comfortably supine in bed.  Respiratory system: Clear. No increased work of breathing. Cardiovascular system: S1 & S2 heard, RRR. No JVD, murmurs, gallops, clicks or pedal edema.Telemetry: Sinus rhythm with occasional PVCs. Gastrointestinal system: Abdomen is nondistended, soft and nontender. Normal bowel sounds heard. Central nervous system: Alert and oriented. No focal neurological deficits. Extremities: Symmetric 5 x 5 power.   Data Reviewed: Basic Metabolic Panel:  Recent Labs Lab 08/31/13 0111 08/31/13 0540 09/01/13 0535  NA 138  --  138  K 4.2  --  4.3  CL 97  --  101  CO2 26  --  25  GLUCOSE 149*  --  190*  BUN 26*  --  27*  CREATININE 1.66* 1.53* 1.70*  CALCIUM 9.9  --  9.6   Liver Function Tests:  Recent Labs Lab 08/31/13 0111  AST 16  ALT 14  ALKPHOS 60  BILITOT 0.4  PROT 6.9  ALBUMIN  3.9   No results found for this basename: LIPASE, AMYLASE,  in the last 168 hours No results found for this basename: AMMONIA,  in the last 168 hours CBC:  Recent Labs Lab 08/31/13 0111 08/31/13 0540 09/01/13 0535  WBC 12.0* 11.8* 9.1  NEUTROABS 4.6  --   --   HGB 12.9* 12.9* 14.3  HCT 37.5* 37.7* 43.8  MCV 88.2 87.9 89.0  PLT 196 182 223   Cardiac Enzymes:  Recent Labs Lab  08/31/13 0112 08/31/13 0230 08/31/13 1055 08/31/13 2235  TROPONINI <0.30 0.35* 0.82* 1.04*   BNP (last 3 results) No results found for this basename: PROBNP,  in the last 8760 hours CBG: No results found for this basename: GLUCAP,  in the last 168 hours  No results found for this or any previous visit (from the past 240 hour(s)).    Additional labs: 1. Cardiac cath 09/01/13 PCI Data:  Vessel - SVG to OM1/OM 2/Segment - proximal  Percent Stenosis (pre) 90%  TIMI-flow 3  Stent 3.5 x 38 mm Xience Xpedition  Percent Stenosis (post) 0%  TIMI-flow (post) 3  Final Conclusions:  1. 3 vessel occlusive coronary disease.  2. Patent LIMA graft to the LAD.  3. Patent free RIMA graft to the RCA  4. Patent but severely diseased saphenous vein graft to the first and second obtuse marginal vessels. There is significant in-stent restenosis.  5. Successful stenting of the saphenous vein graft to OM1/OM 2 with a drug-eluting stent.      Studies: Dg Chest 2 View  08/31/2013   *RADIOLOGY REPORT*  Clinical Data: Chest pain.  CHEST - 2 VIEW  Comparison: Chest radiograph Apr 06, 2011  Findings: Cardiac silhouette is unremarkable.  Status post median sternotomy for apparent coronary artery bypass grafting.  Mild chronic interstitial changes without pleural effusions or focal consolidations.  Mild flattening of the hemidiaphragms.  Pulmonary vasculature is unremarkable.  No pneumothorax.  Mild degenerative change of the thoracic spine.  IMPRESSION: Mild chronic emphysematous changes without superimposed acute cardiopulmonary process.   Original Report Authenticated By: Awilda Metro        Scheduled Meds: . [MAR HOLD] amLODipine  10 mg Oral Daily  . [facility] HOLD] aspirin EC  81 mg Oral Daily  . [COMPLETED] fentaNYL      . [facility] HOLD] metoprolol  25 mg Oral BID  . [COMPLETED] midazolam      . [MAR HOLD] omega-3 acid ethyl esters  1 g Oral Daily  . [MAR HOLD] pantoprazole  40 mg Oral Daily  .  sodium chloride  3 mL Intravenous Q12H   Continuous Infusions: . sodium chloride 1 mL/kg/hr (08/31/13 2000)  . heparin 1,200 Units/hr (08/31/13 2151)    Principal Problem:   Chest pain Active Problems:   HYPERLIPIDEMIA   HYPERTENSION   CAD   CAROTID ARTERY DISEASE   CONTRAST DYE ALLERGY, HX OF   Chronic kidney disease (CKD), stage III (moderate)   GERD (gastroesophageal reflux disease)   Acute myocardial infarction, subendocardial infarction, initial episode of care    Time spent: 45 minutes    [facility]  [facility] Pager [phone].   If 8PM-8AM, please contact night-coverage at [email or web address], password [facility] 09/01/2013, 8:40 AM  LOS: 1 day

## 2013-09-01 NOTE — Progress Notes (Signed)
    Subjective:  Denies CP or dyspnea   Objective:  Filed Vitals:   08/31/13 0349 08/31/13 0955 08/31/13 1243 08/31/13 2058  BP: 131/47 119/60 120/67 113/58  Pulse: 77 68 57 66  Temp: 97.6 F (36.4 C)  98.1 F (36.7 C) 98.3 F (36.8 C)  TempSrc:   Oral Oral  Resp: 18  17 18   Height: 5\' 8"  (1.727 m)     Weight: 182 lb 12.8 oz (82.918 kg)     SpO2: 99%  96% 97%    Intake/Output from previous day:  Intake/Output Summary (Last 24 hours) at 09/01/13 0641 Last data filed at 09/01/13 0544  Gross per 24 hour  Intake  373.6 ml  Output   1210 ml  Net -836.4 ml    Physical Exam: Physical exam: Well-developed well-nourished in no acute distress.  Skin is warm and dry.  HEENT is normal.  Neck is supple.  Chest is clear to auscultation with normal expansion.  Cardiovascular exam is regular rate and rhythm.  Abdominal exam nontender or distended. No masses palpated. Extremities show no edema. neuro grossly intact    Lab Results: Basic Metabolic Panel:  Recent Labs  29/56/21 0111 08/31/13 0540  NA 138  --   K 4.2  --   CL 97  --   CO2 26  --   GLUCOSE 149*  --   BUN 26*  --   CREATININE 1.66* 1.53*  CALCIUM 9.9  --    CBC:  Recent Labs  08/31/13 0111 08/31/13 0540 09/01/13 0535  WBC 12.0* 11.8* 9.1  NEUTROABS 4.6  --   --   HGB 12.9* 12.9* 14.3  HCT 37.5* 37.7* 43.8  MCV 88.2 87.9 89.0  PLT 196 182 223   Cardiac Enzymes:  Recent Labs  08/31/13 0230 08/31/13 1055 08/31/13 2235  TROPONINI 0.35* 0.82* 1.04*     Assessment/Plan:  1 non-ST elevation myocardial infarction-the patient has ruled in. Continue aspirin, heparin and beta blocker. He is intolerant to statins. Proceed with cardiac catheterization today. The risks and benefits were discussed and the patient agrees to proceed. Schedule echocardiogram to quantify LV function. No ventriculogram because of renal insufficiency. Follow renal function after procedure. He has been hydrated and  diuretics are on hold. He has been premedicated for dye allergy. 2 chronic stage III renal insufficiency-follow renal function after procedure. 3 coronary artery disease-continue aspirin. Patient intolerant to statins. 4 peripheral vascular disease-continue aspirin. 5 hypertension-ACE inhibitor and diuretic on hold prior to catheterization.  Olga Millers 09/01/2013, 6:41 AM

## 2013-09-01 NOTE — Progress Notes (Signed)
  Echocardiogram 2D Echocardiogram has been performed.  Arvil Chaco 09/01/2013, 2:42 PM

## 2013-09-01 NOTE — CV Procedure (Signed)
Cardiac Catheterization Procedure Note  Name: [name] [name] MRN: [id number] DOB: 08-17-1931  Procedure: Left Heart Cath, Selective Coronary Angiography, SVG angiography, LIMA angiography, PTCA/Stent of the SVG to OM1/OM2  Indication: 79 year-old white male with history of coronary disease status post redo coronary bypass surgery. He is status post stenting of the saphenous vein graft to the obtuse marginal vessels in 2007. He presents now with a non-ST elevation myocardial infarction and class IV angina.   Diagnostic Procedure Details: The right groin was prepped, draped, and anesthetized with 1% lidocaine. Using the modified Seldinger technique, a 5 French sheath was introduced into the right femoral artery. Standard Judkins catheters were used for selective coronary angiography and left ventriculography. An RCB catheter was used to engage the RIMA graft. An IMA catheter was used to engage the LIMA. Catheter exchanges were performed over a wire.  The diagnostic procedure was well-tolerated without immediate complications. 105 cc of contrast was used.  PROCEDURAL FINDINGS Hemodynamics: AO 155/74 with a mean of 108 mmHg LV 157/21 mmHg  Coronary angiography: Coronary dominance: right  Left mainstem: Normal.  Left anterior descending (LAD): The left anterior descending artery is occluded proximally after the takeoff of a small diagonal branch and septal perforator branch.  Left circumflex (LCx): The left circumflex is occluded proximally.  Right coronary artery (RCA): The right coronary is occluded at the ostium.  There is a saphenous vein graft that fills sequentially the first and second obtuse marginal vessels. There is diffuse in-stent restenosis in the proximal vein graft up to 90%. There is another focal lesion just distal to the stented segment of 90%. The graft is otherwise without significant disease.  The free radial graft to the RCA is widely patent with good  runoff.  The LIMA graft to the LAD is widely patent with good runoff.  Left ventriculography: Not performed to conserve dye load.  PCI Procedure Note:  Following the diagnostic procedure, the decision was made to proceed with PCI. The sheath was upsized to a 6 [place]. Weight-based bivalirudin was given for anticoagulation. Plavix 600 mg was given orally. Once a therapeutic ACT was achieved, a 6 French LCB guide catheter was inserted.  A 3.5 mm filter coronary guidewire was used to cross the lesion and the filter was deployed in the mid vein graft..  The lesion was predilated with a 2.25 mm balloon.  The lesion was then stented with a 3.5 x 38 mm Xience Xpedition stent deployed at 14 atmospheres.   Following PCI, there was 0% residual stenosis and TIMI-3 flow. Final angiography confirmed an excellent result. The filter was retrieved without difficulty. Femoral hemostasis was achieved with manual compression.  The patient tolerated the PCI procedure well. There were no immediate procedural complications.  The patient was transferred to the post catheterization recovery area for further monitoring.  PCI Data: Vessel - SVG to OM1/OM 2/Segment - proximal Percent Stenosis (pre)  90% TIMI-flow 3 Stent 3.5 x 38 mm Xience Xpedition Percent Stenosis (post) 0% TIMI-flow (post) 3  Final Conclusions:   1. 3 vessel occlusive coronary disease. 2. Patent LIMA graft to the LAD. 3. Patent free RIMA graft to the RCA 4. Patent but severely diseased saphenous vein graft to the first and second obtuse marginal vessels. There is significant in-stent restenosis. 5. Successful stenting of the saphenous vein graft to OM1/OM 2 with a drug-eluting stent.  Recommendations: Continue dual antiplatelet therapy for at least one year.  Theron Arista [facility] 09/01/2013, 9:29 AM

## 2013-09-01 NOTE — H&P (View-Only) (Signed)
    Subjective:  Denies CP or dyspnea   Objective:  Filed Vitals:   08/31/13 0349 08/31/13 0955 08/31/13 1243 08/31/13 2058  BP: 131/47 119/60 120/67 113/58  Pulse: 77 68 57 66  Temp: 97.6 F (36.4 C)  98.1 F (36.7 C) 98.3 F (36.8 C)  TempSrc:   Oral Oral  Resp: 18  17 18  Height: 5' 8" (1.727 m)     Weight: 182 lb 12.8 oz (82.918 kg)     SpO2: 99%  96% 97%    Intake/Output from previous day:  Intake/Output Summary (Last 24 hours) at 09/01/13 0641 Last data filed at 09/01/13 0544  Gross per 24 hour  Intake  373.6 ml  Output   1210 ml  Net -836.4 ml    Physical Exam: Physical exam: Well-developed well-nourished in no acute distress.  Skin is warm and dry.  HEENT is normal.  Neck is supple.  Chest is clear to auscultation with normal expansion.  Cardiovascular exam is regular rate and rhythm.  Abdominal exam nontender or distended. No masses palpated. Extremities show no edema. neuro grossly intact    Lab Results: Basic Metabolic Panel:  Recent Labs  08/31/13 0111 08/31/13 0540  NA 138  --   K 4.2  --   CL 97  --   CO2 26  --   GLUCOSE 149*  --   BUN 26*  --   CREATININE 1.66* 1.53*  CALCIUM 9.9  --    CBC:  Recent Labs  08/31/13 0111 08/31/13 0540 09/01/13 0535  WBC 12.0* 11.8* 9.1  NEUTROABS 4.6  --   --   HGB 12.9* 12.9* 14.3  HCT 37.5* 37.7* 43.8  MCV 88.2 87.9 89.0  PLT 196 182 223   Cardiac Enzymes:  Recent Labs  08/31/13 0230 08/31/13 1055 08/31/13 2235  TROPONINI 0.35* 0.82* 1.04*     Assessment/Plan:  1 non-ST elevation myocardial infarction-the patient has ruled in. Continue aspirin, heparin and beta blocker. He is intolerant to statins. Proceed with cardiac catheterization today. The risks and benefits were discussed and the patient agrees to proceed. Schedule echocardiogram to quantify LV function. No ventriculogram because of renal insufficiency. Follow renal function after procedure. He has been hydrated and  diuretics are on hold. He has been premedicated for dye allergy. 2 chronic stage III renal insufficiency-follow renal function after procedure. 3 coronary artery disease-continue aspirin. Patient intolerant to statins. 4 peripheral vascular disease-continue aspirin. 5 hypertension-ACE inhibitor and diuretic on hold prior to catheterization.  Brian Crenshaw 09/01/2013, 6:41 AM    

## 2013-09-01 NOTE — Interval H&P Note (Signed)
History and Physical Interval Note:  09/01/2013 8:36 AM  [name] [name]  has presented today for surgery, with the diagnosis of cp  The various methods of treatment have been discussed with the patient and family. After consideration of risks, benefits and other options for treatment, the patient has consented to  Procedure(s): LEFT HEART CATHETERIZATION WITH CORONARY/GRAFT ANGIOGRAM (N/A) as a surgical intervention .  The patient's history has been reviewed, patient examined, no change in status, stable for surgery.  I have reviewed the patient's chart and labs.  Questions were answered to the patient's satisfaction.    Cath Lab Visit (complete for each Cath Lab visit)  Clinical Evaluation Leading to the Procedure:   ACS: yes  Non-ACS:    Anginal Classification: CCS IV  Anti-ischemic medical therapy: Maximal Therapy (2 or more classes of medications)  Non-Invasive Test Results: No non-invasive testing performed  Prior CABG: Previous CABG       [name] 09/01/2013 8:36 AM

## 2013-09-02 DIAGNOSIS — Z91041 Radiographic dye allergy status: Secondary | ICD-10-CM

## 2013-09-02 DIAGNOSIS — I214 Non-ST elevation (NSTEMI) myocardial infarction: Principal | ICD-10-CM

## 2013-09-02 LAB — CBC
MCH: 30.2 pg (ref 26.0–34.0)
MCHC: 34.6 g/dL (ref 30.0–36.0)
Platelets: 188 10*3/uL (ref 150–400)
RDW: 15.2 % (ref 11.5–15.5)

## 2013-09-02 LAB — BASIC METABOLIC PANEL
BUN: 32 mg/dL — ABNORMAL HIGH (ref 6–23)
Calcium: 8.7 mg/dL (ref 8.4–10.5)
Creatinine, Ser: 1.65 mg/dL — ABNORMAL HIGH (ref 0.50–1.35)
GFR calc Af Amer: 44 mL/min — ABNORMAL LOW (ref >90)
GFR calc non Af Amer: 38 mL/min — ABNORMAL LOW (ref >90)
Glucose, Bld: 114 mg/dL — ABNORMAL HIGH (ref 70–99)
Potassium: 3.7 mEq/L (ref 3.5–5.1)
Sodium: 138 mEq/L (ref 135–145)

## 2013-09-02 MED ORDER — CLOPIDOGREL BISULFATE 75 MG PO TABS: 75.0000 mg | ORAL_TABLET | Freq: Every day | ORAL | Status: DC

## 2013-09-02 MED FILL — Sodium Chloride IV Soln 0.9%: INTRAVENOUS | Qty: 50 | Status: AC

## 2013-09-02 NOTE — Progress Notes (Signed)
CARDIAC REHAB PHASE I   PRE:  Rate/Rhythm: 69 SR    BP: sitting 145/84    SaO2:   MODE:  Ambulation: 600 ft   POST:  Rate/Rhythm: 96 with PVC    BP: sitting 189/61     SaO2:   Denied CP walking, some SOB noted but pt denied this. Ed completed. Pt has his routine and will continue to do that. Encouraged no bicycle x1 week. Reviewed NTG. Pt not interested in CRPII. [phone]   Elissa Lovett [place] CES, ACSM 09/02/2013 8:36 AM

## 2013-09-02 NOTE — Discharge Summary (Signed)
Physician Discharge Summary  Patient ID: [name] [name] MRN: [id number] DOB/AGE: 77-Aug-1932 77 y.o.  Admit date: 08/31/2013 Discharge date: 09/02/2013  Primary Care Physician:  Olivia Canter, MD  Final Discharge Diagnoses:   . Acute MI, Non ST elevation, subendocardial infarction s/p PCI of SVG to OM.   Secondary Admission:  . Chest pain- resolved . CAD . HYPERLIPIDEMIA . HYPERTENSION . CAROTID ARTERY DISEASE . Chronic kidney disease (CKD), stage III (moderate) . GERD (gastroesophageal reflux disease)   Consults:Cardiology   Recommendations for Outpatient Follow-up:  1. BMET TOMORROW, ACE inhibitor and diuretic on hold due to renal insufficiency 2. Patient is intolerant to statins   Allergies:   Allergies  Allergen Reactions  . Uloric [Febuxostat] Other (See Comments)    Upper body paralysis-temporary  . Ivp Dye [Iodinated Diagnostic Agents] Other (See Comments)    Patient continued sneezing   . Statins Other (See Comments)    Patient unable to walk     Discharge Medications:   Medication List    STOP taking these medications       enalapril 10 MG tablet  Commonly known as:  VASOTEC     hydrochlorothiazide 25 MG tablet  Commonly known as:  HYDRODIURIL      TAKE these medications       allopurinol 100 MG tablet  Commonly known as:  ZYLOPRIM  Take 100 mg by mouth daily as needed (for flareup).     amLODipine 10 MG tablet  Commonly known as:  NORVASC  Take 10 mg by mouth daily.     aspirin EC 81 MG tablet  Take 81 mg by mouth daily.     clopidogrel 75 MG tablet  Commonly known as:  PLAVIX  Take 1 tablet (75 mg total) by mouth daily with breakfast.     diphenhydrAMINE 25 MG tablet  Commonly known as:  BENADRYL  Take 25 mg by mouth at bedtime.     famotidine 10 MG chewable tablet  Commonly known as:  PEPCID AC  Chew 10 mg by mouth daily as needed for heartburn.     Fish Oil 1200 MG Caps  Take 1,200 mg by mouth 2 (two) times daily.      metoprolol 50 MG tablet  Commonly known as:  LOPRESSOR  Take 0.5 tablets (25 mg total) by mouth 2 (two) times daily.     Red Yeast Rice 600 MG Tabs  Take 1,200 mg by mouth at bedtime.     tamsulosin 0.4 MG Caps capsule  Commonly known as:  FLOMAX  Take 0.4 mg by mouth daily as needed (for urination).         Brief H and P: For complete details please refer to admission H and P, but in brief, GLENDON [name] is an 79 y.o. male with hx of CAD, s/p CABGx4 with redo in 1996, hx of gout, HTN, hyperlipidemia, carotid disease s/p carotid endarterectomy, GERD, presented to the ER with burning chest pain radiating toward his left shoulder and paresthesia of his left arm. He saw his cardiologist Dr [name] and his betablocker was reduced, along with plan to do a myoview stress test shortly. Earlier this year, he had a stress test which showed a small inferolateral ischemia, and was treated medically. He did have a cardiac catheterization in June of 2007 which revealed normal LV function. His LAD was totally occluded. The LIMA to the LAD was widely patent and there was a 40% stenosis at the insertion into the LAD.  The circumflex was totalled and the saphenous vein graft to the circumflex had a 95% stenosis of the proximal portion. The right coronary artery was occluded and the RIMA to the right coronary artery was intact. The patient had a bare metal stent at that time to the saphenous vein graft to the circumflex. In the ER, he was asymptomatic patient was admitted for further workup.,     Hospital Course:  Chest pain/? NSTEMI CAD/CABG with redo:  Currently chest pain-free. Patient was having increasing episodes of burning chest pain with activity in the past one week prior to admission. Serial cardiac enzymes were obtained which were positive, peaked at 1.04. Cardiology was consulted and recommended a left heart catheterization. The patient underwent cardiac cath on 09/01/2013 which showed 3 vessel  occlusive coronary disease, status post Successful stenting of the saphenous vein graft to OM1/OM 2 with a drug-eluting stent. Patient was recommended to continue dual antiplatelet therapy for at least one year. He has appointment with Dr. [name] in 4 weeks and tomorrow for BMET.  Continue aspirin, Plavix and metoprolol.  Patient is intolerant to statins. ACE inhibitor and diuretics are currently on hold due to renal insufficiency. 2-D echo showed EF of 55%, grade 1 diastolic dysfunction  Hypertension - Controlled   Stage III chronic kidney disease - Baseline creatinine probably in the 1.6 range, BMET tomorrow.   Hyperlipidemia - Intolerant to statins   Cardiac cath 09/01/13 Final Conclusions:  1. 3 vessel occlusive coronary disease.  2. Patent LIMA graft to the LAD.  3. Patent free RIMA graft to the RCA  4. Patent but severely diseased saphenous vein graft to the first and second obtuse marginal vessels. There is significant in-stent restenosis.  5. Successful stenting of the saphenous vein graft to OM1/OM 2 with a drug-eluting stent.  Recommendations: Continue dual antiplatelet therapy for at least one year.      Day of Discharge BP 145/84  Pulse 69  Temp(Src) 97.7 F (36.5 C) (Oral)  Resp 18  Ht 5\' 8"  (1.727 m)  Wt 84 kg (185 lb 3 oz)  BMI 28.16 kg/m2  SpO2 97%  Physical Exam: General: Alert and awake oriented x3 not in any acute distress. HEENT: anicteric sclera, pupils reactive to light and accommodation CVS: S1-S2 clear no murmur rubs or gallops Chest: clear to auscultation bilaterally, no wheezing rales or rhonchi Abdomen: soft nontender, nondistended, normal bowel sounds, no organomegaly Extremities: no cyanosis, clubbing or edema noted bilaterally Neuro: Cranial nerves II-XII intact, no focal neurological deficits   The results of significant diagnostics from this hospitalization (including imaging, microbiology, ancillary and laboratory) are listed below for  reference.    LAB RESULTS: Basic Metabolic Panel:  Recent Labs Lab 09/01/13 0535 09/02/13 0535  NA 138 138  K 4.3 3.7  CL 101 104  CO2 25 22  GLUCOSE 190* 114*  BUN 27* 32*  CREATININE 1.70* 1.65*  CALCIUM 9.6 8.7   Liver Function Tests:  Recent Labs Lab 08/31/13 0111  AST 16  ALT 14  ALKPHOS 60  BILITOT 0.4  PROT 6.9  ALBUMIN 3.9   No results found for this basename: LIPASE, AMYLASE,  in the last 168 hours No results found for this basename: AMMONIA,  in the last 168 hours CBC:  Recent Labs Lab 08/31/13 0111  09/01/13 0535 09/02/13 0535  WBC 12.0*  < > 9.1 19.0*  NEUTROABS 4.6  --   --   --   HGB 12.9*  < > 14.3 12.0*  HCT 37.5*  < > 43.8 34.7*  MCV 88.2  < > 89.0 87.4  PLT 196  < > 223 188  < > = values in this interval not displayed. Cardiac Enzymes:  Recent Labs Lab 08/31/13 1055 08/31/13 2235  TROPONINI 0.82* 1.04*   BNP: No components found with this basename: POCBNP,  CBG: No results found for this basename: GLUCAP,  in the last 168 hours  Significant Diagnostic Studies:  Dg Chest 2 View  08/31/2013   *RADIOLOGY REPORT*  Clinical Data: Chest pain.  CHEST - 2 VIEW  Comparison: Chest radiograph Apr 06, 2011  Findings: Cardiac silhouette is unremarkable.  Status post median sternotomy for apparent coronary artery bypass grafting.  Mild chronic interstitial changes without pleural effusions or focal consolidations.  Mild flattening of the hemidiaphragms.  Pulmonary vasculature is unremarkable.  No pneumothorax.  Mild degenerative change of the thoracic spine.  IMPRESSION: Mild chronic emphysematous changes without superimposed acute cardiopulmonary process.   Original Report Authenticated By: Awilda Metro    2D ECHO: 09/01/13 Study Conclusions  - Left ventricle: The cavity size was normal. There was mild concentric hypertrophy. Systolic function was normal. The estimated ejection fraction was 55%. There is hypokinesis of the  basalinferolateral myocardium. Doppler parameters are consistent with abnormal left ventricular relaxation (grade 1 diastolic dysfunction).    Disposition and Follow-up:     Discharge Orders   Future Appointments Provider Department Dept Phone   10/07/2013 3:00 PM Lewayne Bunting, MD [facility] [place] [phone]   10/28/2013 2:15 PM Lewayne Bunting, MD [facility] [place] [phone]   Future Orders Complete By Expires   Diet - low sodium heart healthy  As directed    Increase activity slowly  As directed        DISPOSITION: Home  DIET: Heart healthy  ACTIVITY: As tolerated  TESTS THAT NEED FOLLOW-UP BMET  DISCHARGE FOLLOW-UP Follow-up Information   Follow up with Olga Millers, MD On 10/07/2013. (at 3:00PM. please get BMET checked tomorrow to foolow your kidney function)    Specialty:  Cardiology   Contact information:   [address] [place] [address]       Follow up with Olivia Canter, MD. Schedule an appointment as soon as possible for a visit in 10 days. (for hospital follow-up)    Specialty:  Family Medicine   Contact information:   [address] [place] [address] [phone]       Time spent on Discharge: 40 MINS  Signed:   RAI,RIPUDEEP M.D. [facility] 09/02/2013, 10:52 AM Pager: [phone]

## 2013-09-02 NOTE — Progress Notes (Signed)
    Subjective:  Denies CP or dyspnea   Objective:  Filed Vitals:   09/01/13 1640 09/01/13 2027 09/01/13 2340 09/02/13 0400  BP: 171/56 116/51 120/50 139/50  Pulse: 84 75 66   Temp: 98.1 F (36.7 C) 97.7 F (36.5 C) 97.7 F (36.5 C) 97.7 F (36.5 C)  TempSrc: Oral Oral Oral Oral  Resp: 17 18 20 18   Height:      Weight:    185 lb 3 oz (84 kg)  SpO2: 98% 95% 97% 97%    Intake/Output from previous day:  Intake/Output Summary (Last 24 hours) at 09/02/13 0736 Last data filed at 09/02/13 0426  Gross per 24 hour  Intake 1322.82 ml  Output    675 ml  Net 647.82 ml    Physical Exam: Physical exam: Well-developed well-nourished in no acute distress.  Skin is warm and dry.  HEENT is normal.  Neck is supple.  Chest is clear to auscultation with normal expansion.  Cardiovascular exam is regular rate and rhythm.  Abdominal exam nontender or distended. No masses palpated. Right groin with no hematoma and no bruit Extremities show no edema. neuro grossly intact    Lab Results: Basic Metabolic Panel:  Recent Labs  96/04/54 0535 09/02/13 0535  NA 138 138  K 4.3 3.7  CL 101 104  CO2 25 22  GLUCOSE 190* 114*  BUN 27* 32*  CREATININE 1.70* 1.65*  CALCIUM 9.6 8.7   CBC:  Recent Labs  08/31/13 0111  09/01/13 0535 09/02/13 0535  WBC 12.0*  < > 9.1 19.0*  NEUTROABS 4.6  --   --   --   HGB 12.9*  < > 14.3 12.0*  HCT 37.5*  < > 43.8 34.7*  MCV 88.2  < > 89.0 87.4  PLT 196  < > 223 188  < > = values in this interval not displayed. Cardiac Enzymes:  Recent Labs  08/31/13 0230 08/31/13 1055 08/31/13 2235  TROPONINI 0.35* 0.82* 1.04*     Assessment/Plan:  1 non-ST elevation myocardial infarction-Continue aspirin, plavix and beta blocker. He is intolerant to statins. Doing well s/p PCI of SVG to OM. Renal function essentially unchanged following procedure; would continue to hold ACEI and HCTZ. Recheck BMET in AM in [place]. OK to DC from cardiac  standpoint and fu with me in [place] in 4 weeks. 2 chronic stage III renal insufficiency-follow renal function as outlined above. 3 coronary artery disease-continue aspirin and plavix. Patient intolerant to statins. 4 peripheral vascular disease-continue aspirin. 5 hypertension-ACE inhibitor and diuretic on hold as outlined.  Olga Millers 09/02/2013, 7:36 AM

## 2013-09-03 ENCOUNTER — Other Ambulatory Visit: Payer: Self-pay | Admitting: *Deleted

## 2013-09-03 ENCOUNTER — Other Ambulatory Visit: Payer: Self-pay

## 2013-09-03 DIAGNOSIS — N289 Disorder of kidney and ureter, unspecified: Secondary | ICD-10-CM

## 2013-09-03 DIAGNOSIS — I2510 Atherosclerotic heart disease of native coronary artery without angina pectoris: Secondary | ICD-10-CM

## 2013-09-03 MED ORDER — CLOPIDOGREL BISULFATE 75 MG PO TABS: 75.0000 mg | ORAL_TABLET | Freq: Every day | ORAL | Status: DC

## 2013-09-04 LAB — BASIC METABOLIC PANEL WITH GFR
Calcium: 8.8 mg/dL (ref 8.4–10.5)
Creat: 1.59 mg/dL — ABNORMAL HIGH (ref 0.50–1.35)
GFR, Est African American: 47 mL/min — ABNORMAL LOW
GFR, Est Non African American: 40 mL/min — ABNORMAL LOW
Sodium: 139 mEq/L (ref 135–145)

## 2013-09-09 ENCOUNTER — Encounter (HOSPITAL_COMMUNITY): Payer: Medicare Other

## 2013-10-07 ENCOUNTER — Ambulatory Visit (INDEPENDENT_AMBULATORY_CARE_PROVIDER_SITE_OTHER): Payer: Medicare Other | Admitting: Cardiology

## 2013-10-07 ENCOUNTER — Encounter: Payer: Self-pay | Admitting: Cardiology

## 2013-10-07 VITALS — BP 142/80 | HR 76 | Wt 192.0 lb

## 2013-10-07 DIAGNOSIS — E785 Hyperlipidemia, unspecified: Secondary | ICD-10-CM

## 2013-10-07 DIAGNOSIS — I2510 Atherosclerotic heart disease of native coronary artery without angina pectoris: Secondary | ICD-10-CM

## 2013-10-07 MED ORDER — HYDROCHLOROTHIAZIDE 25 MG PO TABS: 25.0000 mg | ORAL_TABLET | Freq: Every day | ORAL | Status: DC

## 2013-10-07 MED ORDER — ENALAPRIL MALEATE 10 MG PO TABS: 10.0000 mg | ORAL_TABLET | Freq: Every day | ORAL | Status: DC

## 2013-10-07 NOTE — Progress Notes (Signed)
HPI: FU coronary artery disease. He has a history of myocardial infarction and coronary artery bypass grafting in 1986 and he had a redo coronary artery bypass grafting in 1996. Note he had an abdominal ultrasound in June of 2008 that showed no aneurysm. He has also had a history of a carotid endarterectomy and his most recent carotid Doppler performed in August 2014 showed a 40% to 59% right and 60-79% left stenosis. Followup recommended in six months. Admitted in October of 2014 with chest pain and ruled in. Cardiac catheterization in October 2014 showed a normal left main, occluded LAD, circumflex and right coronary artery. The sequential saphenous vein graft to the first and second obtuse marginals had a 90% in-stent restenosis followed by a second 90% lesion. The free radial graft to the right coronary artery was patent. LIMA to LAD was patent. Patient had PCI of the saphenous vein graft to his marginals with a drug-eluting stent. Echocardiogram in October of 2014 showed normal LV function, grade 1 diastolic dysfunction, mild left atrial enlargement and trace mitral regurgitation. Since discharge, he denies dyspnea, chest pain or palpitations. He does have mild pedal edema.   Current Outpatient Prescriptions  Medication Sig Dispense Refill  . allopurinol (ZYLOPRIM) 100 MG tablet Take 100 mg by mouth daily as needed (for flareup).       Marland Kitchen amLODipine (NORVASC) 10 MG tablet Take 10 mg by mouth daily.        Marland Kitchen aspirin EC 81 MG tablet Take 81 mg by mouth daily.      . clopidogrel (PLAVIX) 75 MG tablet Take 1 tablet (75 mg total) by mouth daily with breakfast.  30 tablet  5  . diphenhydrAMINE (BENADRYL) 25 MG tablet Take 25 mg by mouth at bedtime.       . famotidine (PEPCID AC) 10 MG chewable tablet Chew 10 mg by mouth daily as needed for heartburn.      . metoprolol (LOPRESSOR) 50 MG tablet Take 0.5 tablets (25 mg total) by mouth 2 (two) times daily.  60 tablet  12  . Omega-3 Fatty Acids (FISH  OIL) 1200 MG CAPS Take 1,200 mg by mouth 2 (two) times daily.       . Red Yeast Rice 600 MG TABS Take 1,200 mg by mouth at bedtime.       . Tamsulosin HCl (FLOMAX) 0.4 MG CAPS Take 0.4 mg by mouth daily as needed (for urination).        No current facility-administered medications for this visit.     Past Medical History  Diagnosis Date  . HYPERLIPIDEMIA   . GOUT   . HYPERTENSION   . CAD     a. s/p CABG x 4 1988;  b. s/p redo CABG x 4 1996;  c. 04/2006 Cath/PCI: LM nl, LAD 100p, LCX 100, RCA 100, LIMA->LAD 40 anast, VG->LCX 95p (3.5x24 [employer] BMS), RIMA->RCA ok;  d. 12/2012 MV: smal area of ischemia in basal inferiolateral wall.  . TRANSIENT ISCHEMIC ATTACK   . NEPHROLITHIASIS   . CKD (chronic kidney disease), stage III   . CONTRAST DYE ALLERGY, HX OF   . HOH (hard of hearing)     a. uses hearing aids    Past Surgical History  Procedure Laterality Date  . Coronary artery bypass graft  1986  . Carotid endarterectomy    . Coronary artery bypass graft  1996  . Total knee arthroplasty  10/2009    rt  . Mass excision  05/27/2012    Procedure: EXCISION MASS;  Surgeon: Wayland Denis, DO;  Location: [facility];  Service: Plastics;  Laterality: N/A;  repair of upper lip defect pt is post op  excision of skin cancer with tissue rearrangement or lip switch.    History   Social History  . Marital Status: Married    Spouse Name: N/A    Number of Children: N/A  . Years of Education: N/A   Occupational History  . Not on file.   Social History Main Topics  . Smoking status: Former Smoker    Types: Cigarettes    Quit date: 11/26/1965  . Smokeless tobacco: Not on file  . Alcohol Use: No  . Drug Use: No  . Sexual Activity: Not on file   Other Topics Concern  . Not on file   Social History Narrative  . No narrative on file    ROS: some pain in ankle from recent accident but no fevers or chills, productive cough, hemoptysis, dysphasia, odynophagia, melena,  hematochezia, dysuria, hematuria, rash, seizure activity, orthopnea, PND, claudication. Remaining systems are negative.  Physical Exam: Well-developed well-nourished in no acute distress.  Skin is warm and dry.  HEENT is normal.  Neck is supple.  Chest is clear to auscultation with normal expansion.  Cardiovascular exam is regular rate and rhythm.  Abdominal exam nontender or distended. No masses palpated. Extremities show trace edema. neuro grossly intact

## 2013-10-07 NOTE — Assessment & Plan Note (Signed)
Continue aspirin and Plavix. Intolerant to statin.

## 2013-10-07 NOTE — Assessment & Plan Note (Signed)
Continue aspirin. Followup carotid Dopplers February 2015. 

## 2013-10-07 NOTE — Patient Instructions (Signed)
Your physician wants you to follow-up in: 6 months You will receive a reminder letter in the mail two months in advance. If you don't receive a letter, please call our office to schedule the follow-up appointment.   Your physician recommends that you return for lab work in: Monday, November 17th   Your physician has recommended you make the following change in your medication:  START ON VASOTEC (10 MG) DAILY START HCTZ ( 25 MG ) DAILY

## 2013-10-07 NOTE — Assessment & Plan Note (Signed)
Continue diet. Intolerant to statins. 

## 2013-10-07 NOTE — Assessment & Plan Note (Signed)
Resume enalapril 10 mg daily and HCTZ 25 mg daily. He was taking these medications prior to recent admission and they were held post catheterization until renal function stable. Check potassium and renal function in one week.

## 2013-10-12 ENCOUNTER — Other Ambulatory Visit (INDEPENDENT_AMBULATORY_CARE_PROVIDER_SITE_OTHER): Payer: Medicare Other

## 2013-10-12 DIAGNOSIS — E785 Hyperlipidemia, unspecified: Secondary | ICD-10-CM

## 2013-10-12 DIAGNOSIS — I2510 Atherosclerotic heart disease of native coronary artery without angina pectoris: Secondary | ICD-10-CM

## 2013-10-12 LAB — BASIC METABOLIC PANEL
CO2: 25 mEq/L (ref 19–32)
Calcium: 9.4 mg/dL (ref 8.4–10.5)
GFR: 45.04 mL/min — ABNORMAL LOW (ref >60.00)
Potassium: 3.8 mEq/L (ref 3.5–5.1)
Sodium: 138 mEq/L (ref 135–145)

## 2013-10-28 ENCOUNTER — Encounter: Payer: Self-pay | Admitting: Cardiology

## 2013-10-28 ENCOUNTER — Ambulatory Visit (INDEPENDENT_AMBULATORY_CARE_PROVIDER_SITE_OTHER): Payer: Medicare Other | Admitting: Cardiology

## 2013-10-28 VITALS — BP 140/80 | HR 84 | Wt 187.0 lb

## 2013-10-28 DIAGNOSIS — I2510 Atherosclerotic heart disease of native coronary artery without angina pectoris: Secondary | ICD-10-CM

## 2013-10-28 DIAGNOSIS — E785 Hyperlipidemia, unspecified: Secondary | ICD-10-CM

## 2013-10-28 DIAGNOSIS — I10 Essential (primary) hypertension: Secondary | ICD-10-CM

## 2013-10-28 DIAGNOSIS — I679 Cerebrovascular disease, unspecified: Secondary | ICD-10-CM

## 2013-10-28 NOTE — Progress Notes (Signed)
HPI: FU coronary artery disease. He has a history of myocardial infarction and coronary artery bypass grafting in 1986 and he had a redo coronary artery bypass grafting in 1996. Note he had an abdominal ultrasound in June of 2008 that showed no aneurysm. He has also had a history of a carotid endarterectomy and his most recent carotid Doppler performed in August 2014 showed a 40% to 59% right and 60-79% left stenosis. Followup recommended in six months. Admitted in October of 2014 with chest pain and ruled in. Cardiac catheterization in October 2014 showed a normal left main, occluded LAD, circumflex and right coronary artery. The sequential saphenous vein graft to the first and second obtuse marginals had a 90% in-stent restenosis followed by a second 90% lesion. The free radial graft to the right coronary artery was patent. LIMA to LAD was patent. Patient had PCI of the saphenous vein graft to his marginals with a drug-eluting stent. Echocardiogram in October of 2014 showed normal LV function, grade 1 diastolic dysfunction, mild left atrial enlargement and trace mitral regurgitation. Patient last seen in Nov 2014. Since then, the patient has dyspnea with more extreme activities but not with routine activities. It is relieved with rest. It is not associated with chest pain. There is no orthopnea, PND or pedal edema. There is no syncope or palpitations. There is no exertional chest pain.    Current Outpatient Prescriptions  Medication Sig Dispense Refill  . allopurinol (ZYLOPRIM) 100 MG tablet Take 100 mg by mouth daily as needed (for flareup).       Marland Kitchen amLODipine (NORVASC) 10 MG tablet Take 10 mg by mouth daily.        Marland Kitchen aspirin EC 81 MG tablet Take 81 mg by mouth daily.      . clopidogrel (PLAVIX) 75 MG tablet Take 1 tablet (75 mg total) by mouth daily with breakfast.  30 tablet  5  . diphenhydrAMINE (BENADRYL) 25 MG tablet Take 25 mg by mouth at bedtime.       . enalapril (VASOTEC) 10 MG tablet  Take 1 tablet (10 mg total) by mouth daily.  30 tablet  9  . famotidine (PEPCID AC) 10 MG chewable tablet Chew 10 mg by mouth daily as needed for heartburn.      . hydrochlorothiazide (HYDRODIURIL) 25 MG tablet Take 1 tablet (25 mg total) by mouth daily.  30 tablet  3  . metoprolol (LOPRESSOR) 50 MG tablet Take 0.5 tablets (25 mg total) by mouth 2 (two) times daily.  60 tablet  12  . Omega-3 Fatty Acids (FISH OIL) 1200 MG CAPS Take 1,200 mg by mouth 2 (two) times daily.       . Red Yeast Rice 600 MG TABS Take 1,200 mg by mouth at bedtime.       . Tamsulosin HCl (FLOMAX) 0.4 MG CAPS Take 0.4 mg by mouth daily as needed (for urination).        No current facility-administered medications for this visit.     Past Medical History  Diagnosis Date  . HYPERLIPIDEMIA   . GOUT   . HYPERTENSION   . CAD     a. s/p CABG x 4 1988;  b. s/p redo CABG x 4 1996;  c. 04/2006 Cath/PCI: LM nl, LAD 100p, LCX 100, RCA 100, LIMA->LAD 40 anast, VG->LCX 95p (3.5x24 Liberty BMS), RIMA->RCA ok;  d. 12/2012 MV: smal area of ischemia in basal inferiolateral wall.  . TRANSIENT ISCHEMIC ATTACK   .  NEPHROLITHIASIS   . CKD (chronic kidney disease), stage III   . CONTRAST DYE ALLERGY, HX OF   . HOH (hard of hearing)     a. uses hearing aids    Past Surgical History  Procedure Laterality Date  . Coronary artery bypass graft  1986  . Carotid endarterectomy    . Coronary artery bypass graft  1996  . Total knee arthroplasty  10/2009    rt  . Mass excision  05/27/2012    Procedure: EXCISION MASS;  Surgeon: Wayland Denis, DO;  Location: [facility];  Service: Plastics;  Laterality: N/A;  repair of upper lip defect pt is post op  excision of skin cancer with tissue rearrangement or lip switch.    History   Social History  . Marital Status: Married    Spouse Name: N/A    Number of Children: N/A  . Years of Education: N/A   Occupational History  . Not on file.   Social History Main Topics  . Smoking  status: Former Smoker    Types: Cigarettes    Quit date: 11/26/1965  . Smokeless tobacco: Not on file  . Alcohol Use: No  . Drug Use: No  . Sexual Activity: Not on file   Other Topics Concern  . Not on file   Social History Narrative  . No narrative on file    ROS: no fevers or chills, productive cough, hemoptysis, dysphasia, odynophagia, melena, hematochezia, dysuria, hematuria, rash, seizure activity, orthopnea, PND, pedal edema, claudication. Remaining systems are negative.  Physical Exam: Well-developed well-nourished in no acute distress.  Skin is warm and dry.  HEENT is normal.  Neck is supple.  Chest is clear to auscultation with normal expansion.  Cardiovascular exam is regular rate and rhythm.  Abdominal exam nontender or distended. No masses palpated. Extremities show no edema. neuro grossly intact

## 2013-10-28 NOTE — Assessment & Plan Note (Signed)
Continue aspirin, Plavix and statin. 

## 2013-10-28 NOTE — Assessment & Plan Note (Signed)
Continue statin. 

## 2013-10-28 NOTE — Assessment & Plan Note (Signed)
Continue aspirin and statin. Followup carotid Dopplers February 2015.

## 2013-10-28 NOTE — Assessment & Plan Note (Signed)
Continue present blood pressure medications. 

## 2013-10-28 NOTE — Patient Instructions (Signed)
Your physician wants you to follow-up in: 6 MONTHS WITH DR CRENSHAW You will receive a reminder letter in the mail two months in advance. If you don't receive a letter, please call our office to schedule the follow-up appointment.  

## 2014-01-06 ENCOUNTER — Other Ambulatory Visit (HOSPITAL_COMMUNITY): Payer: Self-pay | Admitting: *Deleted

## 2014-01-06 DIAGNOSIS — I6529 Occlusion and stenosis of unspecified carotid artery: Secondary | ICD-10-CM

## 2014-01-13 ENCOUNTER — Encounter (HOSPITAL_COMMUNITY): Payer: Medicare Other

## 2014-01-25 ENCOUNTER — Encounter: Payer: Self-pay | Admitting: Cardiovascular Disease

## 2014-01-25 ENCOUNTER — Ambulatory Visit (HOSPITAL_COMMUNITY): Payer: Medicare Other | Attending: Cardiovascular Disease

## 2014-01-25 DIAGNOSIS — I6529 Occlusion and stenosis of unspecified carotid artery: Secondary | ICD-10-CM

## 2014-02-15 ENCOUNTER — Other Ambulatory Visit: Payer: Self-pay | Admitting: *Deleted

## 2014-02-15 MED ORDER — CLOPIDOGREL BISULFATE 75 MG PO TABS: 75.0000 mg | ORAL_TABLET | Freq: Every day | ORAL | Status: DC

## 2014-03-01 ENCOUNTER — Other Ambulatory Visit: Payer: Self-pay | Admitting: Cardiology

## 2014-04-28 ENCOUNTER — Encounter: Payer: Self-pay | Admitting: Cardiology

## 2014-04-28 ENCOUNTER — Ambulatory Visit (INDEPENDENT_AMBULATORY_CARE_PROVIDER_SITE_OTHER): Payer: Medicare Other | Admitting: Cardiology

## 2014-04-28 VITALS — BP 140/80 | HR 65 | Ht 68.0 in | Wt 187.0 lb

## 2014-04-28 DIAGNOSIS — E785 Hyperlipidemia, unspecified: Secondary | ICD-10-CM

## 2014-04-28 DIAGNOSIS — I6529 Occlusion and stenosis of unspecified carotid artery: Secondary | ICD-10-CM

## 2014-04-28 DIAGNOSIS — I10 Essential (primary) hypertension: Secondary | ICD-10-CM

## 2014-04-28 DIAGNOSIS — I214 Non-ST elevation (NSTEMI) myocardial infarction: Secondary | ICD-10-CM

## 2014-04-28 DIAGNOSIS — I2510 Atherosclerotic heart disease of native coronary artery without angina pectoris: Secondary | ICD-10-CM

## 2014-04-28 MED ORDER — AMLODIPINE BESYLATE 5 MG PO TABS: 5.0000 mg | ORAL_TABLET | Freq: Every day | ORAL | Status: DC

## 2014-04-28 NOTE — Assessment & Plan Note (Addendum)
Patient states his blood pressure runs low making him dizzy at times. Decrease Norvasc to 5 mg daily.

## 2014-04-28 NOTE — Assessment & Plan Note (Signed)
Continue aspirin. Intolerant to statins. Followup carotid Dopplers September 2015.

## 2014-04-28 NOTE — Assessment & Plan Note (Signed)
Continue aspirin and Plavix. Intolerant to statins. 

## 2014-04-28 NOTE — Assessment & Plan Note (Signed)
Continue present medications. Intolerant to statins. 

## 2014-04-28 NOTE — Patient Instructions (Signed)
Your physician wants you to follow-up in: [place] will receive a reminder letter in the mail two months in advance. If you don't receive a letter, please call our office to schedule the follow-up appointment.   DECREASE AMLODIPINE TO 5 MG ONCE DAILY= 1/2 OF 10 MG TABLET ONCE DAILY

## 2014-04-28 NOTE — Progress Notes (Signed)
HPI: FU coronary artery disease. He has a history of myocardial infarction and coronary artery bypass grafting in 1986 and he had a redo coronary artery bypass grafting in 1996. Note he had an abdominal ultrasound in June of 2008 that showed no aneurysm. He has also had a history of a carotid endarterectomy and his most recent carotid Doppler performed in March 2015 showed a 40% to 59% right and 60-79% left stenosis. Followup recommended in six months. Admitted in October of 2014 with chest pain and ruled in. Cardiac catheterization in October 2014 showed a normal left main, occluded LAD, circumflex and right coronary artery. The sequential saphenous vein graft to the first and second obtuse marginals had a 90% in-stent restenosis followed by a second 90% lesion. The free radial graft to the right coronary artery was patent. LIMA to LAD was patent. Patient had PCI of the saphenous vein graft to his marginals with a drug-eluting stent. Echocardiogram in October of 2014 showed normal LV function, grade 1 diastolic dysfunction, mild left atrial enlargement and trace mitral regurgitation. Patient last seen in Dec 2014. Since then, He denies dyspnea, chest pain, palpitations or syncope. He states his blood pressure occasionally runs low. It makes him feel weak.  Current Outpatient Prescriptions  Medication Sig Dispense Refill  . allopurinol (ZYLOPRIM) 100 MG tablet Take 100 mg by mouth daily as needed (for flareup).       Marland Kitchen amLODipine (NORVASC) 10 MG tablet Take 10 mg by mouth daily.        Marland Kitchen aspirin EC 81 MG tablet Take 81 mg by mouth daily.      . clopidogrel (PLAVIX) 75 MG tablet Take 1 tablet (75 mg total) by mouth daily with breakfast.  30 tablet  2  . diphenhydrAMINE (BENADRYL) 25 MG tablet Take 25 mg by mouth at bedtime.       . enalapril (VASOTEC) 10 MG tablet Take 1 tablet (10 mg total) by mouth daily.  30 tablet  9  . famotidine (PEPCID AC) 10 MG chewable tablet Chew 10 mg by mouth daily as  needed for heartburn.      . hydrochlorothiazide (HYDRODIURIL) 25 MG tablet TAKE 1 TABLET DAILY AND TAKE AN EXTRA TABLET IF SWELLING INCREASES  60 tablet  3  . metoprolol (LOPRESSOR) 50 MG tablet Take 0.5 tablets (25 mg total) by mouth 2 (two) times daily.  60 tablet  12  . Omega-3 Fatty Acids (FISH OIL) 1200 MG CAPS Take 1,200 mg by mouth 2 (two) times daily.       . Red Yeast Rice 600 MG TABS Take 1,200 mg by mouth at bedtime.       . Tamsulosin HCl (FLOMAX) 0.4 MG CAPS Take 0.4 mg by mouth daily as needed (for urination).        No current facility-administered medications for this visit.     Past Medical History  Diagnosis Date  . HYPERLIPIDEMIA   . GOUT   . HYPERTENSION   . CAD     a. s/p CABG x 4 1988;  b. s/p redo CABG x 4 1996;  c. 04/2006 Cath/PCI: LM nl, LAD 100p, LCX 100, RCA 100, LIMA->LAD 40 anast, VG->LCX 95p (3.5x24 Liberty BMS), RIMA->RCA ok;  d. 12/2012 MV: smal area of ischemia in basal inferiolateral wall.  . TRANSIENT ISCHEMIC ATTACK   . NEPHROLITHIASIS   . CKD (chronic kidney disease), stage III   . CONTRAST DYE ALLERGY, HX OF   . HOH (  hard of hearing)     a. uses hearing aids    Past Surgical History  Procedure Laterality Date  . Coronary artery bypass graft  1986  . Carotid endarterectomy    . Coronary artery bypass graft  1996  . Total knee arthroplasty  10/2009    rt  . Mass excision  05/27/2012    Procedure: EXCISION MASS;  Surgeon: Theodoro Kos, DO;  Location: [facility];  Service: Plastics;  Laterality: N/A;  repair of upper lip defect pt is post op  excision of skin cancer with tissue rearrangement or lip switch.    History   Social History  . Marital Status: Married    Spouse Name: N/A    Number of Children: N/A  . Years of Education: N/A   Occupational History  . Not on file.   Social History Main Topics  . Smoking status: Former Smoker    Types: Cigarettes    Quit date: 11/26/1965  . Smokeless tobacco: Not on file  .  Alcohol Use: No  . Drug Use: No  . Sexual Activity: Not on file   Other Topics Concern  . Not on file   Social History Narrative  . No narrative on file    ROS: no fevers or chills, productive cough, hemoptysis, dysphasia, odynophagia, melena, hematochezia, dysuria, hematuria, rash, seizure activity, orthopnea, PND, pedal edema, claudication. Remaining systems are negative.  Physical Exam: Well-developed well-nourished in no acute distress.  Skin is warm and dry.  HEENT is normal.  Neck is supple.  Chest is clear to auscultation with normal expansion.  Cardiovascular exam is regular rate and rhythm.  Abdominal exam nontender or distended. No masses palpated. Extremities show no edema. neuro grossly intact  ECG Sinus rhythm with occasional PVC. Nonspecific ST changes.

## 2014-06-08 ENCOUNTER — Emergency Department (INDEPENDENT_AMBULATORY_CARE_PROVIDER_SITE_OTHER): Payer: Medicare Other

## 2014-06-08 ENCOUNTER — Emergency Department
Admission: EM | Admit: 2014-06-08 | Discharge: 2014-06-08 | Disposition: A | Discharge status: Home / Self Care | Payer: Medicare Other | Source: Home / Self Care | Attending: Emergency Medicine | Admitting: Emergency Medicine

## 2014-06-08 ENCOUNTER — Encounter: Payer: Self-pay | Admitting: Emergency Medicine

## 2014-06-08 DIAGNOSIS — M25473 Effusion, unspecified ankle: Secondary | ICD-10-CM

## 2014-06-08 DIAGNOSIS — M79609 Pain in unspecified limb: Secondary | ICD-10-CM

## 2014-06-08 DIAGNOSIS — M79671 Pain in right foot: Secondary | ICD-10-CM

## 2014-06-08 DIAGNOSIS — M25579 Pain in unspecified ankle and joints of unspecified foot: Secondary | ICD-10-CM

## 2014-06-08 DIAGNOSIS — M25476 Effusion, unspecified foot: Secondary | ICD-10-CM

## 2014-06-08 DIAGNOSIS — M7989 Other specified soft tissue disorders: Secondary | ICD-10-CM

## 2014-06-08 DIAGNOSIS — M25571 Pain in right ankle and joints of right foot: Secondary | ICD-10-CM

## 2014-06-08 NOTE — ED Provider Notes (Signed)
CSN: [id number]     Arrival date & time 06/08/14  1635 History   First MD Initiated Contact with Patient 06/08/14 1705     Chief Complaint  Patient presents with  . Ankle Pain   (Consider location/radiation/quality/duration/timing/severity/associated sxs/prior Treatment) HPI Inversion injury to his right foot and ankle while walking down a hill at his house yesterday.  Has not used any medications or modalities.  No previous fractures but has twisted that ankle before and sprained it.  Pain is mild to moderate, worsening since yesterday, worse with bearing weight and walking, better with rest.  Past Medical History  Diagnosis Date  . HYPERLIPIDEMIA   . GOUT   . HYPERTENSION   . CAD     a. s/p CABG x 4 1988;  b. s/p redo CABG x 4 1996;  c. 04/2006 Cath/PCI: LM nl, LAD 100p, LCX 100, RCA 100, LIMA->LAD 40 anast, VG->LCX 95p (3.5x24 Liberty BMS), RIMA->RCA ok;  d. 12/2012 MV: smal area of ischemia in basal inferiolateral wall.  . TRANSIENT ISCHEMIC ATTACK   . NEPHROLITHIASIS   . CKD (chronic kidney disease), stage III   . CONTRAST DYE ALLERGY, HX OF   . HOH (hard of hearing)     a. uses hearing aids   Past Surgical History  Procedure Laterality Date  . Coronary artery bypass graft  1986  . Carotid endarterectomy    . Coronary artery bypass graft  1996  . Total knee arthroplasty  10/2009    rt  . Mass excision  05/27/2012    Procedure: EXCISION MASS;  Surgeon: Theodoro Kos, DO;  Location: [facility];  Service: Plastics;  Laterality: N/A;  repair of upper lip defect pt is post op  excision of skin cancer with tissue rearrangement or lip switch.   Family History  Problem Relation Age of Onset  . Heart attack Father   . Heart disease Sister   . Stroke Brother    History  Substance Use Topics  . Smoking status: Former Smoker    Types: Cigarettes    Quit date: 11/26/1965  . Smokeless tobacco: Not on file  . Alcohol Use: No    Review of Systems  All other systems  reviewed and are negative.   Allergies  Uloric; Ivp dye; and Statins  Home Medications   Prior to Admission medications   Medication Sig Start Date End Date Taking? Authorizing Provider  allopurinol (ZYLOPRIM) 100 MG tablet Take 100 mg by mouth daily as needed (for flareup).     Historical Provider, MD  amLODipine (NORVASC) 5 MG tablet Take 1 tablet (5 mg total) by mouth daily. 04/28/14   Lelon Perla, MD  aspirin EC 81 MG tablet Take 81 mg by mouth daily.    Historical Provider, MD  clopidogrel (PLAVIX) 75 MG tablet Take 1 tablet (75 mg total) by mouth daily with breakfast. 02/15/14   Lelon Perla, MD  diphenhydrAMINE (BENADRYL) 25 MG tablet Take 25 mg by mouth at bedtime.     Historical Provider, MD  enalapril (VASOTEC) 10 MG tablet Take 1 tablet (10 mg total) by mouth daily. 10/07/13   Lelon Perla, MD  famotidine (PEPCID AC) 10 MG chewable tablet Chew 10 mg by mouth daily as needed for heartburn.    Historical Provider, MD  hydrochlorothiazide (HYDRODIURIL) 25 MG tablet TAKE 1 TABLET DAILY AND TAKE AN EXTRA TABLET IF SWELLING INCREASES 03/01/14   Lelon Perla, MD  metoprolol (LOPRESSOR) 50 MG tablet Take  0.5 tablets (25 mg total) by mouth 2 (two) times daily. 08/26/13   Lelon Perla, MD  Omega-3 Fatty Acids (FISH OIL) 1200 MG CAPS Take 1,200 mg by mouth 2 (two) times daily.     Historical Provider, MD  Red Yeast Rice 600 MG TABS Take 1,200 mg by mouth at bedtime.     Historical Provider, MD  Tamsulosin HCl (FLOMAX) 0.4 MG CAPS Take 0.4 mg by mouth daily as needed (for urination).     Historical Provider, MD   BP 149/75  Pulse 61  Temp(Src) 98.1 F (36.7 C) (Oral)  Resp 18  SpO2 98% Physical Exam  Nursing note and vitals reviewed. Constitutional: He is oriented to person, place, and time. He appears well-developed and well-nourished.  HENT:  Head: Normocephalic and atraumatic.  Eyes: No scleral icterus.  Neck: Neck supple.  Cardiovascular: Regular rhythm and  normal heart sounds.   Pulmonary/Chest: Effort normal and breath sounds normal. No respiratory distress.  Musculoskeletal:  R ankle/foot: FROM, +TTP ATLF, CFL along common peroneal tendon, and mildly at base of 5th metatarsal.   No TTP medial/lateral malleolus, navicular,  calcaneus, Achilles, proximal fibula.  Mild lateral swelling.  No ecchymoses.  Distal neurovascular status is intact.    Neurological: He is alert and oriented to person, place, and time.  Skin: Skin is warm and dry.  Psychiatric: He has a normal mood and affect. His speech is normal.    ED Course  Procedures (including critical care time) Labs Review Labs Reviewed - No data to display  Imaging Review Dg Ankle Complete Right  06/08/2014   CLINICAL DATA:  Recent traumatic injury with lateral pain  EXAM: RIGHT ANKLE - COMPLETE 3+ VIEW  COMPARISON:  08/01/2011  FINDINGS: Mild soft tissue swelling is noted about the ankle. Postsurgical changes are noted medially. Changes of prior trauma adjacent to the lateral malleolus are seen. No acute abnormality is noted. Degenerative changes in the tarsal bones are noted.  IMPRESSION: Chronic changes without acute abnormality.   Electronically Signed   By: Inez Catalina M.D.   On: 06/08/2014 17:38   Dg Foot 2 Views Right  06/08/2014   CLINICAL DATA:  Injury, redness and swelling.  EXAM: RIGHT FOOT - 2 VIEW  COMPARISON:  Right foot radiograph August 01, 2011  FINDINGS: AP and oblique views of the right foot. There is no evidence of fracture or dislocation. Mild first metatarsophalangeal osteoarthrosis. No destructive bony lesions. Soft tissue swelling along the medial first metatarsophalangeal joint with fluffy calcifications and underlying probable erosion, could reflect gout though is nonspecific. Vascular clips along medial ankle.  IMPRESSION: No acute fracture deformity or dislocation.  Possible first metatarsophalangeal gout.   Electronically Signed   By: Elon Alas   On:  06/08/2014 17:40     MDM   1. Right foot pain   2. Right ankle pain    An x-ray was ordered and read by the radiologist as above.  In reference to the foot x-ray, there is no pain at the big toe in the area shown.  Patient with chronic bony changes, likely an ankle sprain, but could have irritated old avulsion or perhaps has a small new avulsion.  Encourage rest, ice, compression with ACE bandage, and elevation of injured body part.  The role of anti-inflammatories is discussed with the patient.   The patient has a boot at home and will wear that and followup with his orthopedist in about 2 weeks.  Janeann Forehand,  MD 06/08/14 1754

## 2014-06-08 NOTE — ED Notes (Signed)
Pt c/o RT ankle injury x 1 day ago while walking down a hill at home he twisted it.

## 2014-07-29 ENCOUNTER — Other Ambulatory Visit: Payer: Self-pay | Admitting: Cardiology

## 2014-08-31 ENCOUNTER — Other Ambulatory Visit: Payer: Self-pay | Admitting: Cardiology

## 2014-09-20 ENCOUNTER — Other Ambulatory Visit: Payer: Self-pay | Admitting: Cardiology

## 2014-10-06 ENCOUNTER — Ambulatory Visit: Payer: Medicare Other | Admitting: Cardiology

## 2014-10-13 ENCOUNTER — Ambulatory Visit (INDEPENDENT_AMBULATORY_CARE_PROVIDER_SITE_OTHER): Payer: Medicare Other | Admitting: Cardiology

## 2014-10-13 ENCOUNTER — Encounter: Payer: Self-pay | Admitting: *Deleted

## 2014-10-13 ENCOUNTER — Encounter: Payer: Self-pay | Admitting: Cardiology

## 2014-10-13 VITALS — BP 158/70 | HR 60 | Ht 68.0 in | Wt 186.8 lb

## 2014-10-13 DIAGNOSIS — I6523 Occlusion and stenosis of bilateral carotid arteries: Secondary | ICD-10-CM

## 2014-10-13 DIAGNOSIS — I10 Essential (primary) hypertension: Secondary | ICD-10-CM

## 2014-10-13 DIAGNOSIS — I2510 Atherosclerotic heart disease of native coronary artery without angina pectoris: Secondary | ICD-10-CM

## 2014-10-13 NOTE — Addendum Note (Signed)
Addended by: Cristopher Estimable on: 10/13/2014 03:02 PM   Modules accepted: Orders

## 2014-10-13 NOTE — Assessment & Plan Note (Signed)
Intolerant to statins. Continue diet. 

## 2014-10-13 NOTE — Progress Notes (Signed)
HPI: FU coronary artery disease. He has a history of myocardial infarction and coronary artery bypass grafting in 1986 and he had a redo coronary artery bypass grafting in 1996. Note he had an abdominal ultrasound in June of 2008 that showed no aneurysm. He has also had a history of a carotid endarterectomy and his most recent carotid Doppler performed in March 2015 showed a 40% to 59% right and 60-79% left stenosis. Followup recommended in six months. Admitted in October of 2014 with chest pain and ruled in. Cardiac catheterization in October 2014 showed a normal left main, occluded LAD, circumflex and right coronary artery. The sequential saphenous vein graft to the first and second obtuse marginals had a 90% in-stent restenosis followed by a second 90% lesion. The free radial graft to the right coronary artery was patent. LIMA to LAD was patent. Patient had PCI of the saphenous vein graft to his marginals with a drug-eluting stent. Echocardiogram in October of 2014 showed normal LV function, grade 1 diastolic dysfunction, mild left atrial enlargement and trace mitral regurgitation. Since I last saw him, There is no dyspnea, chest pain, palpitations or syncope.  Current Outpatient Prescriptions  Medication Sig Dispense Refill  . allopurinol (ZYLOPRIM) 100 MG tablet Take 100 mg by mouth daily as needed (for flareup).     Marland Kitchen amLODipine (NORVASC) 5 MG tablet Take 1 tablet (5 mg total) by mouth daily. 90 tablet 3  . aspirin EC 81 MG tablet Take 81 mg by mouth daily.    . clopidogrel (PLAVIX) 75 MG tablet TAKE ONE TABLET DAILY WITH BREAKFAST 30 tablet 2  . diphenhydrAMINE (BENADRYL) 25 MG tablet Take 25 mg by mouth at bedtime.     . enalapril (VASOTEC) 10 MG tablet TAKE 1 TABLET DAILY. 90 tablet 1  . famotidine (PEPCID AC) 10 MG chewable tablet Chew 10 mg by mouth daily as needed for heartburn.    . hydrochlorothiazide (HYDRODIURIL) 25 MG tablet TAKE 1 TABLET DAILY AND TAKE AN EXTRA TABLET IF  SWELLING INCREASES 60 tablet 3  . metoprolol (LOPRESSOR) 50 MG tablet TAKE 1/2 TABLET TWICE A DAY 60 tablet 2  . nitroGLYCERIN (NITROSTAT) 0.4 MG SL tablet Place 0.4 mg under the tongue every 5 (five) minutes as needed for chest pain.    . Omega-3 Fatty Acids (FISH OIL) 1200 MG CAPS Take 1,200 mg by mouth 2 (two) times daily.     . Red Yeast Rice 600 MG TABS Take 1,200 mg by mouth at bedtime.     . Tamsulosin HCl (FLOMAX) 0.4 MG CAPS Take 0.4 mg by mouth daily as needed (for urination).      No current facility-administered medications for this visit.     Past Medical History  Diagnosis Date  . HYPERLIPIDEMIA   . GOUT   . HYPERTENSION   . CAD     a. s/p CABG x 4 1988;  b. s/p redo CABG x 4 1996;  c. 04/2006 Cath/PCI: LM nl, LAD 100p, LCX 100, RCA 100, LIMA->LAD 40 anast, VG->LCX 95p (3.5x24 Liberty BMS), RIMA->RCA ok;  d. 12/2012 MV: smal area of ischemia in basal inferiolateral wall.  . TRANSIENT ISCHEMIC ATTACK   . NEPHROLITHIASIS   . CKD (chronic kidney disease), stage III   . CONTRAST DYE ALLERGY, HX OF   . HOH (hard of hearing)     a. uses hearing aids    Past Surgical History  Procedure Laterality Date  . Coronary artery bypass graft  1986  . Carotid endarterectomy    . Coronary artery bypass graft  1996  . Total knee arthroplasty  10/2009    rt  . Mass excision  05/27/2012    Procedure: EXCISION MASS;  Surgeon: Theodoro Kos, DO;  Location: [place];  Service: Plastics;  Laterality: N/A;  repair of upper lip defect pt is post op  excision of skin cancer with tissue rearrangement or lip switch.    History   Social History  . Marital Status: Married    Spouse Name: N/A    Number of Children: N/A  . Years of Education: N/A   Occupational History  . Not on file.   Social History Main Topics  . Smoking status: Former Smoker    Types: Cigarettes    Quit date: 11/26/1965  . Smokeless tobacco: Not on file  . Alcohol Use: No  . Drug Use: No  . Sexual  Activity: Not on file   Other Topics Concern  . Not on file   Social History Narrative    ROS: no fevers or chills, productive cough, hemoptysis, dysphasia, odynophagia, melena, hematochezia, dysuria, hematuria, rash, seizure activity, orthopnea, PND, pedal edema, claudication. Remaining systems are negative.  Physical Exam: Well-developed well-nourished in no acute distress.  Skin is warm and dry.  HEENT is normal.  Neck is supple.  Chest is clear to auscultation with normal expansion.  Cardiovascular exam is regular rate and rhythm.  Abdominal exam nontender or distended. No masses palpated. Extremities show no edema. neuro grossly intact  ECG Sinus rhythm at a rate of 56. Normal axis. No ST changes.

## 2014-10-13 NOTE — Assessment & Plan Note (Signed)
Continue aspirin. Intolerant to statins. 

## 2014-10-13 NOTE — Assessment & Plan Note (Signed)
Blood pressure is mildly elevated. I have asked him to follow this at home. We will increase medications as needed.

## 2014-10-13 NOTE — Assessment & Plan Note (Signed)
Continue aspirin. Schedule follow-up carotid Dopplers.

## 2014-10-13 NOTE — Patient Instructions (Signed)
Your physician wants you to follow-up in: 6 MONTHS WITH DR CRENSHAW You will receive a reminder letter in the mail two months in advance. If you don't receive a letter, please call our office to schedule the follow-up appointment.   Your physician has requested that you have a carotid duplex. This test is an ultrasound of the carotid arteries in your neck. It looks at blood flow through these arteries that supply the brain with blood. Allow one hour for this exam. There are no restrictions or special instructions.   

## 2014-11-03 ENCOUNTER — Ambulatory Visit (HOSPITAL_COMMUNITY)
Admission: RE | Admit: 2014-11-03 | Discharge: 2014-11-03 | Disposition: A | Discharge status: Home / Self Care | Payer: Medicare Other | Source: Ambulatory Visit | Attending: Cardiovascular Disease | Admitting: Cardiovascular Disease

## 2014-11-03 DIAGNOSIS — I6523 Occlusion and stenosis of bilateral carotid arteries: Secondary | ICD-10-CM | POA: Diagnosis present

## 2014-11-03 NOTE — Progress Notes (Addendum)
Carotid Duplex Completed. No significant changes noted since previous exam. Brianna L Mazza,RVT

## 2014-11-04 ENCOUNTER — Encounter (HOSPITAL_COMMUNITY): Payer: Self-pay | Admitting: Cardiology

## 2014-11-09 ENCOUNTER — Telehealth: Payer: Self-pay | Admitting: Cardiology

## 2014-11-09 DIAGNOSIS — M79604 Pain in right leg: Secondary | ICD-10-CM

## 2014-11-09 NOTE — Telephone Encounter (Signed)
Spoke with pt wife, for about 2 weeks now the patient has had swelling in his legs and significant pain in his legs. The swelling and pain is mostly below the knee in the calf muscle. The swelling is there in the morning and worse by the end of the day. There is no discoloration but there was a rash when this first started. He denies SOB or chest pain. The pain is always at rest, and at night, never when he is up walking. He saw his medical doctor and they told him they did not know what it was so the patient called [place]. They wonder if pain is from circulation problems Will forward for dr Stanford Breed review

## 2014-11-09 NOTE — Telephone Encounter (Signed)
Pt's wife called in wanting to get an appt with Dr. Stanford Breed for the pt because she expressed that he has had some swelling in his legs for the past couple of week and that his legs are in pain when he is inactive( sitting, sleeping). She also stated that he is unable to sleep at night because the pain in unbearable. Please call  Thanks

## 2014-11-09 NOTE — Telephone Encounter (Signed)
DC HCTZ and begin lasix 40 mg daily; bmet one week; fuov with me or PA. Kirk Ruths

## 2014-11-10 NOTE — Telephone Encounter (Addendum)
Discussed with dr Stanford Breed, aware of patient complaints. LEA ordered and scheduled with the patient

## 2014-11-10 NOTE — Telephone Encounter (Signed)
Spoke with pt, he reports he is not having a lot of swelling and he does not feel he needs a stronger fluid pill. He is having pain in the right leg from the knee down. When he gets up and walks on it the pain will go away, but when he is resting he has a throbbing pain in that leg. He has had a knee replacement in the right leg before and wonders if something is wrong with it. encouraged patient to call the orthopedic doctor but he wants to make sure it is not the blood flow. Explained pain usually associated with blood flow is with exertion. Patient would like me to discuss with dr Stanford Breed before he calls the orthopedic doctor.

## 2014-11-10 NOTE — Addendum Note (Signed)
Addended by: Cristopher Estimable on: 11/10/2014 10:40 AM   Modules accepted: Orders

## 2014-11-11 ENCOUNTER — Telehealth: Payer: Self-pay | Admitting: *Deleted

## 2014-11-11 ENCOUNTER — Ambulatory Visit (HOSPITAL_COMMUNITY)
Admission: RE | Admit: 2014-11-11 | Discharge: 2014-11-11 | Disposition: A | Discharge status: Home / Self Care | Payer: Medicare Other | Source: Ambulatory Visit | Attending: Cardiovascular Disease | Admitting: Cardiovascular Disease

## 2014-11-11 DIAGNOSIS — I779 Disorder of arteries and arterioles, unspecified: Secondary | ICD-10-CM

## 2014-11-11 DIAGNOSIS — M79604 Pain in right leg: Secondary | ICD-10-CM | POA: Diagnosis present

## 2014-11-11 DIAGNOSIS — I739 Peripheral vascular disease, unspecified: Principal | ICD-10-CM

## 2014-11-11 NOTE — Telephone Encounter (Signed)
Spoke with patient and informed results. Patient voiced understanding. Orders placed for 1 year follow up Dopplers

## 2014-11-11 NOTE — Telephone Encounter (Signed)
-----   Message from Lelon Perla, MD sent at 11/10/2014  2:29 PM EST ----- Fu carotid doppers 12 months Kirk Ruths

## 2014-11-11 NOTE — Progress Notes (Signed)
Lower Extremity Arterial Duplex Completed. °Brianna L Mazza,RVT °

## 2014-12-07 ENCOUNTER — Encounter: Payer: Self-pay | Admitting: Hematology & Oncology

## 2014-12-08 ENCOUNTER — Telehealth: Payer: Self-pay | Admitting: Hematology & Oncology

## 2014-12-08 NOTE — Telephone Encounter (Signed)
I spoke w NEW PATIENT wife Vanita Ingles) today to remind them of their appointment with Dr. Marin Olp. Also, advised them to bring all medication bottles and insurance card information.

## 2014-12-09 ENCOUNTER — Telehealth: Payer: Self-pay | Admitting: Hematology & Oncology

## 2014-12-09 ENCOUNTER — Other Ambulatory Visit (HOSPITAL_BASED_OUTPATIENT_CLINIC_OR_DEPARTMENT_OTHER): Payer: Medicare Other | Admitting: Lab

## 2014-12-09 ENCOUNTER — Encounter: Payer: Self-pay | Admitting: Hematology & Oncology

## 2014-12-09 ENCOUNTER — Ambulatory Visit (HOSPITAL_BASED_OUTPATIENT_CLINIC_OR_DEPARTMENT_OTHER): Payer: Medicare Other | Admitting: Hematology & Oncology

## 2014-12-09 ENCOUNTER — Ambulatory Visit (HOSPITAL_BASED_OUTPATIENT_CLINIC_OR_DEPARTMENT_OTHER): Payer: Medicare Other

## 2014-12-09 ENCOUNTER — Other Ambulatory Visit: Payer: Self-pay

## 2014-12-09 VITALS — BP 157/58 | HR 60 | Temp 97.5°F | Resp 18 | Ht 67.0 in | Wt 182.0 lb

## 2014-12-09 DIAGNOSIS — D72829 Elevated white blood cell count, unspecified: Secondary | ICD-10-CM

## 2014-12-09 DIAGNOSIS — D72820 Lymphocytosis (symptomatic): Secondary | ICD-10-CM

## 2014-12-09 DIAGNOSIS — R590 Localized enlarged lymph nodes: Secondary | ICD-10-CM

## 2014-12-09 DIAGNOSIS — Z8551 Personal history of malignant neoplasm of bladder: Secondary | ICD-10-CM

## 2014-12-09 LAB — COMPREHENSIVE METABOLIC PANEL (CC13)
ALBUMIN: 4.1 g/dL (ref 3.5–5.0)
ALK PHOS: 58 U/L (ref 40–150)
ALT: 14 U/L (ref 0–55)
AST: 20 U/L (ref 5–34)
Anion Gap: 12 mEq/L — ABNORMAL HIGH (ref 3–11)
BUN: 37.4 mg/dL — AB (ref 7.0–26.0)
CO2: 24 meq/L (ref 22–29)
Calcium: 9.2 mg/dL (ref 8.4–10.4)
Chloride: 106 mEq/L (ref 98–109)
Creatinine: 1.7 mg/dL — ABNORMAL HIGH (ref 0.7–1.3)
EGFR: 38 mL/min/{1.73_m2} — ABNORMAL LOW (ref >90)
GLUCOSE: 110 mg/dL (ref 70–140)
Potassium: 4.3 mEq/L (ref 3.5–5.1)
SODIUM: 141 meq/L (ref 136–145)
Total Bilirubin: 0.44 mg/dL (ref 0.20–1.20)
Total Protein: 6.7 g/dL (ref 6.4–8.3)

## 2014-12-09 LAB — CBC WITH DIFFERENTIAL (CANCER CENTER ONLY)
HCT: 38.4 % — ABNORMAL LOW (ref 38.7–49.9)
HGB: 12.3 g/dL — ABNORMAL LOW (ref 13.0–17.1)
MCH: 30.7 pg (ref 28.0–33.4)
MCHC: 32 g/dL (ref 32.0–35.9)
MCV: 96 fL (ref 82–98)
Platelets: 217 10*3/uL (ref 145–400)
RBC: 4.01 10*6/uL — AB (ref 4.20–5.70)
RDW: 15.9 % — ABNORMAL HIGH (ref 11.1–15.7)
WBC: 21.6 10*3/uL — ABNORMAL HIGH (ref 4.0–10.0)

## 2014-12-09 LAB — MANUAL DIFFERENTIAL (CHCC SATELLITE)
ALC: 16.7 10*3/uL — ABNORMAL HIGH (ref 0.9–3.3)
ANC (CHCC MAN DIFF): 4.5 10*3/uL (ref 1.5–6.5)
Band Neutrophils: 2 % (ref 0–10)
EOS: 2 % (ref 0–7)
LYMPH: 77 % — ABNORMAL HIGH (ref 14–48)
MONO: 0 % (ref 0–13)
PLT EST ~~LOC~~: ADEQUATE
SEG: 19 % — AB (ref 40–75)

## 2014-12-09 LAB — CHCC SATELLITE - SMEAR

## 2014-12-09 MED ORDER — PREDNISONE 50 MG PO TABS: ORAL_TABLET | ORAL | Status: DC

## 2014-12-09 NOTE — Progress Notes (Signed)
Referral MD  Reason for Referral: Abdominal and pelvic lymphadenopathy. History of superficial bladder cancer   Chief Complaint  Patient presents with  . NEW PATIENT  : I have something wrong that on my CAT scan  HPI: [name] [name] is a very nice 81 year old gentleman. He has appropriate fairly complicated past history. He has history of coronary artery disease. He's had 2 bypasses. I think the last bypass was back in 1996.  He's had multiple surgeries. He's had carotid endarterectomy. He's had knee surgery.  He sees Dr. Jeffie Pollock of urology. Dr. 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 Biopsies were done. This showed a high-grade urothelial papillary cancer. He underwent a TURBT. He then underwent installation of intravesicular therapy, which I assume is BCG.  He's been having some abdominal discomfort. He's has some kidney stones. He thinks he may have passed one of the stones.  He underwent a CAT scan back in early January. [place] enough, this showed prominent lymph nodes within the mesentery and peritoneum. I think the largest lymph node measured 2.3 cm. This was in the gastrohepatic ligament region.  In reviewing his past CT scan from about 2 and half years ago, these lymph nodes appear to be a little bit larger.  There is no splenomegaly. The liver looks okay. Kidneys look fine.  He has some fatigue. He has no cough. There is no shortness of breath. He's had no rashes. He's had no leg swelling. He's had no palpable lymph glands.  His appetite has been okay. He's had no nausea or vomiting.  He did denies any type or rash.  There is no weight loss or weight gain.  He was currently referred to the [facility] for an evaluation.  He comes in with his wife and daughter. His daughter has multiple sclerosis. His wife has her own set health problems.                       Past Medical History  Diagnosis Date  .  HYPERLIPIDEMIA   . GOUT   . HYPERTENSION   . CAD     a. s/p CABG x 4 1988;  b. s/p redo CABG x 4 1996;  c. 04/2006 Cath/PCI: LM nl, LAD 100p, LCX 100, RCA 100, LIMA->LAD 40 anast, VG->LCX 95p (3.5x24 [facility] BMS), RIMA->RCA ok;  d. 12/2012 MV: smal area of ischemia in basal inferiolateral wall.  . TRANSIENT ISCHEMIC ATTACK   . NEPHROLITHIASIS   . CKD (chronic kidney disease), stage III   . CONTRAST DYE ALLERGY, HX OF   . HOH (hard of hearing)     a. uses hearing aids  :  Past Surgical History  Procedure Laterality Date  . Coronary artery bypass graft  1986  . Carotid endarterectomy    . Coronary artery bypass graft  1996  . Total knee arthroplasty  10/2009    rt  . Mass excision  05/27/2012    Procedure: EXCISION MASS;  Surgeon: Theodoro Kos, DO;  Location: [place];  Service: Plastics;  Laterality: N/A;  repair of upper lip defect pt is post op  excision of skin cancer with tissue rearrangement or lip switch.  . Left heart catheterization with coronary/graft angiogram N/A 09/01/2013    Procedure: LEFT HEART CATHETERIZATION WITH Beatrix Fetters;  Surgeon: Slayton Lubitz M [place], MD;  Location: [facility] CATH LAB;  Service: Cardiovascular;  Laterality: N/A;  :   Current outpatient prescriptions:  .  allopurinol (ZYLOPRIM) 100 MG tablet, Take 100 mg by mouth daily as needed (for flareup). , Disp: , Rfl:  .  amLODipine (NORVASC) 5 MG tablet, Take 1 tablet (5 mg total) by mouth daily., Disp: 90 tablet, Rfl: 3 .  aspirin EC 81 MG tablet, Take 81 mg by mouth daily., Disp: , Rfl:  .  calcium carbonate (TUMS - DOSED IN MG ELEMENTAL CALCIUM) 500 MG chewable tablet, Chew 1 tablet by mouth as needed for indigestion or heartburn., Disp: , Rfl:  .  clopidogrel (PLAVIX) 75 MG tablet, TAKE ONE TABLET DAILY WITH BREAKFAST, Disp: 30 tablet, Rfl: 2 .  diphenhydrAMINE (BENADRYL) 25 MG tablet, Take 25 mg by mouth at bedtime. , Disp: , Rfl:  .  enalapril (VASOTEC) 10 MG tablet, TAKE 1 TABLET  DAILY., Disp: 90 tablet, Rfl: 1 .  hydrochlorothiazide (HYDRODIURIL) 25 MG tablet, TAKE 1 TABLET DAILY AND TAKE AN EXTRA TABLET IF SWELLING INCREASES, Disp: 60 tablet, Rfl: 3 .  metoprolol (LOPRESSOR) 50 MG tablet, TAKE 1/2 TABLET TWICE A DAY, Disp: 60 tablet, Rfl: 2 .  nitroGLYCERIN (NITROSTAT) 0.4 MG SL tablet, Place 0.4 mg under the tongue every 5 (five) minutes as needed for chest pain., Disp: , Rfl:  .  Omega-3 Fatty Acids (FISH OIL) 1200 MG CAPS, Take 1,200 mg by mouth 2 (two) times daily. , Disp: , Rfl:  .  predniSONE (DELTASONE) 5 MG tablet, Take 5 mg by mouth 2 (two) times daily with a meal. , Disp: , Rfl: 0 .  Red Yeast Rice 600 MG TABS, Take 1,200 mg by mouth at bedtime. , Disp: , Rfl:  .  Tamsulosin HCl (FLOMAX) 0.4 MG CAPS, Take 0.4 mg by mouth daily as needed (for urination). , Disp: , Rfl:  .  predniSONE (DELTASONE) 50 MG tablet, Take 1 tablet 13 hours, 7 hours and 1 hour prior to CT scan., Disp: 3 tablet, Rfl: 0:  :  Allergies  Allergen Reactions  . Uloric [Febuxostat] Other (See Comments)    Upper body paralysis-temporary  . Ivp Dye [Iodinated Diagnostic Agents] Other (See Comments)    Patient continued sneezing   . Statins Other (See Comments)    Patient unable to walk  :  Family History  Problem Relation Age of Onset  . Heart attack Father   . Heart disease Sister   . Stroke Brother   :  History   Social History  . Marital Status: Married    Spouse Name: N/A    Number of Children: N/A  . Years of Education: N/A   Occupational History  . Not on file.   Social History Main Topics  . Smoking status: Former Smoker -- 1.00 packs/day for 13 years    Types: Cigarettes    Start date: 02/06/1953    Quit date: 11/26/1965  . Smokeless tobacco: Never Used     Comment: quit smoking 46 years ago  . Alcohol Use: No  . Drug Use: No  . Sexual Activity: Not on file   Other Topics Concern  . Not on file   Social History Narrative  :  Pertinent items are noted  in HPI.  Exam: @[facility]ITALS @  elderly but fairly well-nourished white gentleman in no obvious distress. Vital signs her temperature of 97.5. Pulse 60. Blood pressure 157/58. Weight is 182 pounds. Head and neck exam shows no ocular or oral lesions. There are no palpable cervical or supraclavicular lymph nodes. Lungs are clear. Cardiac exam regular in rhythm with no murmurs, rubs  or bruits. Axillary exam shows no obvious axillary adenopathy. Abdomen is soft. Has good bowel sounds. There is no fluid wave. There is no guarding or rebound tenderness. There is no palpable liver or spleen tip. Back exam shows some slight kyphosis. I think this is more age-related than actual osteoporotic changes. He has decent range of motion of his spine. Extremities shows no clubbing, cyanosis or edema. He has good range of motion of his joints. He has fairly symmetric strength (4/5) it is extremities. Neurological exam shows no focal neurological deficits.    Recent Labs  12/09/14 1122  WBC 21.6*  HGB 12.3*  HCT 38.4*  PLT 217    Recent Labs  12/09/14 1142  NA 141  K 4.3  CO2 24  GLUCOSE 110  BUN 37.4*  CREATININE 1.7*  CALCIUM 9.2    Blood smear review: None  Pathology: None     Assessment and Plan: [name] [name] is a very nice 81 year old gentleman. He has past history of superficial bladder cancer. He has peripheral artery disease. He has cerebrovascular disease.  He is on quite a few medications.  I think that by the white cell elevation and the increase in lymphocytes, it would not surprise me at this is CLL or possibly low-grade lymphoma.  I cannot see anything else that looks suspicious in examining him.  He is pretty much asymptomatic with this.  I think if we needed a biopsy, the only way we would get this is with a laparoscopic procedure. I does don't see that he needs to be put through this right now.  I suppose a PET scan could be done. Again, these lymph nodes are small and I just  don't think that a PET scan would be [place] pickup some of these lymph nodes.  I did look at his left smear. I suspect that he probably has chronic lymphocytic leukemia. The blood smear certainly looks consistent with this. We will have to see about setting off flow cytology studies. I would also send off cytogenic analysis if we can get a good specimen.  I spent about an hour with he and his family. They're all very nice. They have a very strong faith.  I want to repeat a CT scan on him in about 3 months. I think this would be helpful. Again, I would send off his peripheral blood for flow cytometry.  Again, he is asymptomatic with these lymph nodes. They're all very small. I cannot imagine that they would ever cause him any issues at the present time.

## 2014-12-09 NOTE — Telephone Encounter (Signed)
4-14 CT and lab, pt aware to come here for lab prior to CT, pt has copy of radiology premed instructions. Pt has prescription for the meds

## 2014-12-13 LAB — LACTATE DEHYDROGENASE: LDH: 177 U/L (ref 94–250)

## 2014-12-13 LAB — BETA 2 MICROGLOBULIN, SERUM: BETA 2 MICROGLOBULIN: 4.55 mg/L — AB (ref <=2.51)

## 2014-12-13 LAB — PSA: PSA: 1.28 ng/mL (ref <=4.00)

## 2014-12-22 NOTE — Addendum Note (Signed)
Addended by: Burney Gauze R on: 12/22/2014 05:30 PM   Modules accepted: Orders

## 2015-01-03 ENCOUNTER — Other Ambulatory Visit: Payer: Self-pay | Admitting: Cardiology

## 2015-02-26 ENCOUNTER — Other Ambulatory Visit: Payer: Self-pay | Admitting: Cardiology

## 2015-03-03 ENCOUNTER — Other Ambulatory Visit: Payer: Self-pay | Admitting: Cardiology

## 2015-03-10 ENCOUNTER — Ambulatory Visit (HOSPITAL_BASED_OUTPATIENT_CLINIC_OR_DEPARTMENT_OTHER)
Admission: RE | Admit: 2015-03-10 | Discharge: 2015-03-10 | Disposition: A | Discharge status: Home / Self Care | Payer: Medicare Other | Source: Ambulatory Visit | Attending: Hematology & Oncology | Admitting: Hematology & Oncology

## 2015-03-10 ENCOUNTER — Other Ambulatory Visit (HOSPITAL_BASED_OUTPATIENT_CLINIC_OR_DEPARTMENT_OTHER): Payer: Medicare Other

## 2015-03-10 ENCOUNTER — Other Ambulatory Visit (HOSPITAL_COMMUNITY)
Admission: RE | Admit: 2015-03-10 | Discharge: 2015-03-10 | Disposition: A | Discharge status: Home / Self Care | Payer: Medicare Other | Source: Ambulatory Visit | Attending: Hematology & Oncology | Admitting: Hematology & Oncology

## 2015-03-10 ENCOUNTER — Encounter (HOSPITAL_BASED_OUTPATIENT_CLINIC_OR_DEPARTMENT_OTHER): Payer: Self-pay

## 2015-03-10 DIAGNOSIS — R591 Generalized enlarged lymph nodes: Secondary | ICD-10-CM | POA: Diagnosis present

## 2015-03-10 DIAGNOSIS — I10 Essential (primary) hypertension: Secondary | ICD-10-CM | POA: Insufficient documentation

## 2015-03-10 DIAGNOSIS — C679 Malignant neoplasm of bladder, unspecified: Secondary | ICD-10-CM | POA: Diagnosis not present

## 2015-03-10 DIAGNOSIS — R590 Localized enlarged lymph nodes: Secondary | ICD-10-CM | POA: Insufficient documentation

## 2015-03-10 DIAGNOSIS — N189 Chronic kidney disease, unspecified: Secondary | ICD-10-CM | POA: Diagnosis not present

## 2015-03-10 HISTORY — DX: Malignant (primary) neoplasm, unspecified: C80.1

## 2015-03-10 LAB — CMP (CANCER CENTER ONLY)
ALBUMIN: 3.9 g/dL (ref 3.3–5.5)
ALT: 17 U/L (ref 10–47)
AST: 18 U/L (ref 11–38)
Alkaline Phosphatase: 61 U/L (ref 26–84)
BILIRUBIN TOTAL: 0.6 mg/dL (ref 0.20–1.60)
BUN, Bld: 27 mg/dL — ABNORMAL HIGH (ref 7–22)
CO2: 26 mEq/L (ref 18–33)
Calcium: 9.8 mg/dL (ref 8.0–10.3)
Chloride: 101 mEq/L (ref 98–108)
Creat: 1.6 mg/dl — ABNORMAL HIGH (ref 0.6–1.2)
Glucose, Bld: 186 mg/dL — ABNORMAL HIGH (ref 73–118)
Potassium: 4.5 mEq/L (ref 3.3–4.7)
Sodium: 140 mEq/L (ref 128–145)
Total Protein: 7.1 g/dL (ref 6.4–8.1)

## 2015-03-10 LAB — MANUAL DIFFERENTIAL (CHCC SATELLITE)
ALC: 9.6 10*3/uL — AB (ref 0.9–3.3)
ANC (CHCC MAN DIFF): 4.2 10*3/uL (ref 1.5–6.5)
Band Neutrophils: 2 % (ref 0–10)
Eos: 1 % (ref 0–7)
LYMPH: 69 % — ABNORMAL HIGH (ref 14–48)
PLT EST ~~LOC~~: ADEQUATE
SEG: 28 % — ABNORMAL LOW (ref 40–75)

## 2015-03-10 LAB — CBC WITH DIFFERENTIAL (CANCER CENTER ONLY)
HCT: 38.4 % — ABNORMAL LOW (ref 38.7–49.9)
HGB: 12.6 g/dL — ABNORMAL LOW (ref 13.0–17.1)
MCH: 31 pg (ref 28.0–33.4)
MCHC: 32.8 g/dL (ref 32.0–35.9)
MCV: 95 fL (ref 82–98)
Platelets: 211 10*3/uL (ref 145–400)
RBC: 4.06 10*6/uL — ABNORMAL LOW (ref 4.20–5.70)
RDW: 15.2 % (ref 11.1–15.7)
WBC: 14 10*3/uL — ABNORMAL HIGH (ref 4.0–10.0)

## 2015-03-10 LAB — LACTATE DEHYDROGENASE: LDH: 221 U/L (ref 94–250)

## 2015-03-10 MED ORDER — IOHEXOL 300 MG/ML  SOLN
80.0000 mL | Freq: Once | INTRAMUSCULAR | Status: AC | PRN
  Administered 2015-03-10: 80 mL via INTRAVENOUS

## 2015-03-11 ENCOUNTER — Telehealth: Payer: Self-pay | Admitting: Nurse Practitioner

## 2015-03-11 NOTE — Telephone Encounter (Addendum)
Pt verbalized understanding and appreciation.   ----- Message from [name], MD sent at 03/10/2015  5:04 PM EDT ----- Please call and let him know that everything looks fairly stable. Nothing appears to be growing since last CT scan. pete

## 2015-03-14 ENCOUNTER — Telehealth: Payer: Self-pay | Admitting: *Deleted

## 2015-03-14 LAB — FLOW CYTOMETRY - CHCC SATELLITE

## 2015-03-14 NOTE — Telephone Encounter (Addendum)
Patient aware of results.   ----- Message from [name], MD sent at 03/10/2015  5:04 PM EDT ----- Please call and let him know that everything looks fairly stable. Nothing appears to be growing since last CT scan. pete

## 2015-03-17 ENCOUNTER — Ambulatory Visit (HOSPITAL_BASED_OUTPATIENT_CLINIC_OR_DEPARTMENT_OTHER): Payer: Medicare Other | Admitting: Hematology & Oncology

## 2015-03-17 ENCOUNTER — Other Ambulatory Visit: Payer: Self-pay

## 2015-03-17 VITALS — BP 148/60 | HR 54 | Temp 97.8°F

## 2015-03-17 DIAGNOSIS — R590 Localized enlarged lymph nodes: Secondary | ICD-10-CM

## 2015-03-17 DIAGNOSIS — C9110 Chronic lymphocytic leukemia of B-cell type not having achieved remission: Secondary | ICD-10-CM

## 2015-03-17 MED ORDER — PREDNISONE 5 MG PO TABS: 5.0000 mg | ORAL_TABLET | Freq: Two times a day (BID) | ORAL | Status: DC

## 2015-03-17 MED ORDER — PREDNISONE 50 MG PO TABS: ORAL_TABLET | ORAL | Status: DC

## 2015-03-17 NOTE — Progress Notes (Signed)
Hematology and Oncology Follow Up Visit  [name] [name] [id number] 1931/11/02 81 y.o. 03/17/2015   Principle Diagnosis:   Abdominal/mesenteric lymphadenopathy- likely CLL versus low-grade lymphoma  History of superficial bladder cancer  Current Therapy:    Observation     Interim History:  [name] [name] is back for follow-up. We did go ahead and get a CT scan on him today. It does show mesenteric lymphadenopathy. It looks pretty stable. There is no splenomegaly.  We did do a flow cytometry on his peripheral blood when he was last here. The flow cytometry was positive for monoclonal leukocyte population of cells that was consistent with CLL or low-grade lymphoma.  He feels okay. There is no abdominal pain. He has no nausea or vomiting. He has no change in bowel or bladder habits.  He's not noted any palpable lymph glands.  Overall, his performance status is ECOG 2.  Medications:  Current outpatient prescriptions:  .  allopurinol (ZYLOPRIM) 100 MG tablet, Take 100 mg by mouth daily as needed (for flareup). , Disp: , Rfl:  .  amLODipine (NORVASC) 5 MG tablet, Take 1 tablet (5 mg total) by mouth daily., Disp: 90 tablet, Rfl: 3 .  aspirin EC 81 MG tablet, Take 81 mg by mouth daily., Disp: , Rfl:  .  calcium carbonate (TUMS - DOSED IN MG ELEMENTAL CALCIUM) 500 MG chewable tablet, Chew 1 tablet by mouth as needed for indigestion or heartburn., Disp: , Rfl:  .  clopidogrel (PLAVIX) 75 MG tablet, TAKE ONE TABLET DAILY WITH BREAKFAST, Disp: 30 tablet, Rfl: 6 .  diphenhydrAMINE (BENADRYL) 25 MG tablet, Take 25 mg by mouth at bedtime. , Disp: , Rfl:  .  enalapril (VASOTEC) 10 MG tablet, TAKE 1 TABLET DAILY., Disp: 90 tablet, Rfl: 1 .  hydrochlorothiazide (HYDRODIURIL) 25 MG tablet, TAKE 1 TABLET DAILY AND TAKE AN EXTRA TABLET IF SWELLING INCREASES, Disp: 60 tablet, Rfl: 3 .  metoprolol (LOPRESSOR) 50 MG tablet, TAKE 1/2 TABLET TWICE A DAY, Disp: 60 tablet, Rfl: 0 .  nitroGLYCERIN  (NITROSTAT) 0.4 MG SL tablet, Place 0.4 mg under the tongue every 5 (five) minutes as needed for chest pain., Disp: , Rfl:  .  Omega-3 Fatty Acids (FISH OIL) 1200 MG CAPS, Take 1,200 mg by mouth 2 (two) times daily. , Disp: , Rfl:  .  predniSONE (DELTASONE) 5 MG tablet, Take 1 tablet (5 mg total) by mouth 2 (two) times daily with a meal., Disp: 60 tablet, Rfl: 2 .  Red Yeast Rice 600 MG TABS, Take 1,200 mg by mouth at bedtime. , Disp: , Rfl:  .  Tamsulosin HCl (FLOMAX) 0.4 MG CAPS, Take 0.4 mg by mouth daily as needed (for urination). , Disp: , Rfl:  .  predniSONE (DELTASONE) 50 MG tablet, Take 1 tablet 13 hours, 7 hours and 1 hour prior to CT scan., Disp: 3 tablet, Rfl: 0  Allergies:  Allergies  Allergen Reactions  . Uloric [Febuxostat] Other (See Comments)    Upper body paralysis-temporary  . Ivp Dye [Iodinated Diagnostic Agents] Other (See Comments)    Patient continued sneezing   . Statins Other (See Comments)    Patient unable to walk    Past Medical History, Surgical history, Social history, and Family History were reviewed and updated.  Review of Systems: As above  Physical Exam:  oral temperature is 97.8 F (36.6 C). His blood pressure is 148/60 and his pulse is 54.   Wt Readings from Last 3 Encounters:  12/09/14 182 lb (  82.555 kg)  10/13/14 186 lb 12.8 oz (84.732 kg)  04/28/14 187 lb 0.6 oz (84.841 kg)     Well-developed and well-nourished elderly gentleman. Head and neck exam shows no ocular or oral lesions. There is no adenopathy in the neck or super clicker regions. Lungs are with some decrease of the bases. Cardiac exam regular rate and rhythm with no murmurs, rubs or bruits. Axillary exam does show some small left axillary lymph nodes that measure about 1 cm and are mobile and nontender. Abdomen is soft. Has good bowel sounds. There is no fluid wave. There is no palpable liver or spleen tip. There is no inguinal adenopathy. Extremities shows no clubbing, cyanosis or  edema. Has some age related changes in his joints. Skin exam shows no rashes, ecchymoses or petechia.  Lab Results  Component Value Date   WBC 14.0* 03/10/2015   HGB 12.6* 03/10/2015   HCT 38.4* 03/10/2015   MCV 95 03/10/2015   PLT 211 03/10/2015     Chemistry      Component Value Date/Time   NA 140 03/10/2015 0950   NA 141 12/09/2014 1142   NA 138 10/12/2013 1518   K 4.5 03/10/2015 0950   K 4.3 12/09/2014 1142   K 3.8 10/12/2013 1518   CL 101 03/10/2015 0950   CL 104 10/12/2013 1518   CO2 26 03/10/2015 0950   CO2 24 12/09/2014 1142   CO2 25 10/12/2013 1518   BUN 27* 03/10/2015 0950   BUN 37.4* 12/09/2014 1142   BUN 28* 10/12/2013 1518   CREATININE 1.6* 03/10/2015 0950   CREATININE 1.7* 12/09/2014 1142   CREATININE 1.6* 10/12/2013 1518      Component Value Date/Time   CALCIUM 9.8 03/10/2015 0950   CALCIUM 9.2 12/09/2014 1142   CALCIUM 9.4 10/12/2013 1518   ALKPHOS 61 03/10/2015 0950   ALKPHOS 58 12/09/2014 1142   ALKPHOS 60 08/31/2013 0111   AST 18 03/10/2015 0950   AST 20 12/09/2014 1142   AST 16 08/31/2013 0111   ALT 17 03/10/2015 0950   ALT 14 12/09/2014 1142   ALT 14 08/31/2013 0111   BILITOT 0.60 03/10/2015 0950   BILITOT 0.44 12/09/2014 1142   BILITOT 0.4 08/31/2013 0111         Impression and Plan: [name] [name] is 81 year old gentleman. I have to suspect that this is CLL. The flow cytometry I will be the key.  He is asymptomatic. His white cell count is not going up. He is not anemic or thrombocytopenic.  I do not see that he needs therapy right now.  I we can just follow him along. I another CT scan in 4 months would be appropriate.  I suspect that we probably will need to do institute some treatment in the future. I think, his face, we can easily go with accommodation oral and IV. I don't think he would have a problem with this.  I spoke to he and his wife for about 30 minutes.  I gave him a copy of the labs and the scan results.  We will  see him back a week after his scan is done in August.   [name], MD 4/21/20162:07 PM

## 2015-03-23 ENCOUNTER — Other Ambulatory Visit: Payer: Self-pay | Admitting: *Deleted

## 2015-03-23 ENCOUNTER — Other Ambulatory Visit: Payer: Self-pay | Admitting: Cardiology

## 2015-03-23 MED ORDER — PREDNISONE 50 MG PO TABS: ORAL_TABLET | ORAL | Status: DC

## 2015-04-06 ENCOUNTER — Other Ambulatory Visit: Payer: Self-pay | Admitting: Cardiology

## 2015-05-27 ENCOUNTER — Other Ambulatory Visit: Payer: Self-pay | Admitting: Cardiology

## 2015-06-27 ENCOUNTER — Other Ambulatory Visit: Payer: Self-pay | Admitting: Cardiology

## 2015-07-05 ENCOUNTER — Other Ambulatory Visit: Payer: Self-pay | Admitting: Nurse Practitioner

## 2015-07-06 ENCOUNTER — Other Ambulatory Visit: Payer: Self-pay | Admitting: *Deleted

## 2015-07-06 ENCOUNTER — Telehealth: Payer: Self-pay | Admitting: *Deleted

## 2015-07-06 DIAGNOSIS — Z91041 Radiographic dye allergy status: Secondary | ICD-10-CM

## 2015-07-06 MED ORDER — PREDNISONE 50 MG PO TABS: ORAL_TABLET | ORAL | Status: DC

## 2015-07-06 NOTE — Telephone Encounter (Signed)
Called and spoke to patient's wife and reviewed pre-medication protocol for patient's CT scan on Monday. Patient has a history of IV contrast allergy. Spoke to wife and instructed her on prednisone and benadryl dosing and schedule information. She read back the instructions. Benadryl is OTC and prescription for Prednisone sent to pharmacy.

## 2015-07-08 ENCOUNTER — Other Ambulatory Visit (HOSPITAL_BASED_OUTPATIENT_CLINIC_OR_DEPARTMENT_OTHER): Payer: Medicare Other

## 2015-07-08 DIAGNOSIS — R591 Generalized enlarged lymph nodes: Secondary | ICD-10-CM | POA: Diagnosis not present

## 2015-07-08 DIAGNOSIS — R590 Localized enlarged lymph nodes: Secondary | ICD-10-CM

## 2015-07-08 DIAGNOSIS — C9110 Chronic lymphocytic leukemia of B-cell type not having achieved remission: Secondary | ICD-10-CM

## 2015-07-08 LAB — MANUAL DIFFERENTIAL (CHCC SATELLITE)
ALC: 21.9 10*3/uL — ABNORMAL HIGH (ref 0.9–3.3)
ANC (CHCC HP manual diff): 6.3 10*3/uL (ref 1.5–6.5)
Eos: 1 % (ref 0–7)
LYMPH: 77 % — ABNORMAL HIGH (ref 14–48)
PLT EST ~~LOC~~: ADEQUATE
SEG: 22 % — ABNORMAL LOW (ref 40–75)

## 2015-07-08 LAB — COMPREHENSIVE METABOLIC PANEL (CC13)
ALK PHOS: 61 U/L (ref 40–150)
ALT: 12 U/L (ref 0–55)
ANION GAP: 10 meq/L (ref 3–11)
AST: 14 U/L (ref 5–34)
Albumin: 3.8 g/dL (ref 3.5–5.0)
BILIRUBIN TOTAL: 0.34 mg/dL (ref 0.20–1.20)
BUN: 29.7 mg/dL — ABNORMAL HIGH (ref 7.0–26.0)
CO2: 22 meq/L (ref 22–29)
Calcium: 9.7 mg/dL (ref 8.4–10.4)
Chloride: 110 mEq/L — ABNORMAL HIGH (ref 98–109)
Creatinine: 1.7 mg/dL — ABNORMAL HIGH (ref 0.7–1.3)
EGFR: 37 mL/min/{1.73_m2} — ABNORMAL LOW (ref >90)
Glucose: 85 mg/dl (ref 70–140)
POTASSIUM: 4.3 meq/L (ref 3.5–5.1)
SODIUM: 141 meq/L (ref 136–145)
TOTAL PROTEIN: 6.2 g/dL — AB (ref 6.4–8.3)

## 2015-07-08 LAB — CBC WITH DIFFERENTIAL (CANCER CENTER ONLY)
HEMATOCRIT: 35.5 % — AB (ref 38.7–49.9)
HGB: 11.6 g/dL — ABNORMAL LOW (ref 13.0–17.1)
MCH: 31.9 pg (ref 28.0–33.4)
MCHC: 32.7 g/dL (ref 32.0–35.9)
MCV: 98 fL (ref 82–98)
Platelets: 186 10*3/uL (ref 145–400)
RBC: 3.64 10*6/uL — ABNORMAL LOW (ref 4.20–5.70)
RDW: 16 % — AB (ref 11.1–15.7)
WBC: 28.5 10*3/uL — ABNORMAL HIGH (ref 4.0–10.0)

## 2015-07-08 LAB — CHCC SATELLITE - SMEAR

## 2015-07-11 ENCOUNTER — Telehealth: Payer: Self-pay | Admitting: Emergency Medicine

## 2015-07-11 ENCOUNTER — Encounter (HOSPITAL_BASED_OUTPATIENT_CLINIC_OR_DEPARTMENT_OTHER): Payer: Self-pay

## 2015-07-11 ENCOUNTER — Ambulatory Visit (HOSPITAL_BASED_OUTPATIENT_CLINIC_OR_DEPARTMENT_OTHER)
Admission: RE | Admit: 2015-07-11 | Discharge: 2015-07-11 | Disposition: A | Discharge status: Home / Self Care | Payer: Medicare Other | Source: Ambulatory Visit | Attending: Hematology & Oncology | Admitting: Hematology & Oncology

## 2015-07-11 ENCOUNTER — Other Ambulatory Visit: Payer: Medicare Other

## 2015-07-11 DIAGNOSIS — N200 Calculus of kidney: Secondary | ICD-10-CM | POA: Insufficient documentation

## 2015-07-11 DIAGNOSIS — N189 Chronic kidney disease, unspecified: Secondary | ICD-10-CM | POA: Diagnosis not present

## 2015-07-11 DIAGNOSIS — R590 Localized enlarged lymph nodes: Secondary | ICD-10-CM | POA: Diagnosis not present

## 2015-07-11 DIAGNOSIS — I2510 Atherosclerotic heart disease of native coronary artery without angina pectoris: Secondary | ICD-10-CM | POA: Diagnosis not present

## 2015-07-11 DIAGNOSIS — I129 Hypertensive chronic kidney disease with stage 1 through stage 4 chronic kidney disease, or unspecified chronic kidney disease: Secondary | ICD-10-CM | POA: Diagnosis not present

## 2015-07-11 DIAGNOSIS — Z8551 Personal history of malignant neoplasm of bladder: Secondary | ICD-10-CM | POA: Diagnosis not present

## 2015-07-11 DIAGNOSIS — C9110 Chronic lymphocytic leukemia of B-cell type not having achieved remission: Secondary | ICD-10-CM

## 2015-07-11 MED ORDER — IOHEXOL 300 MG/ML  SOLN
80.0000 mL | Freq: Once | INTRAMUSCULAR | Status: AC | PRN
  Administered 2015-07-11: 75 mL via INTRAVENOUS

## 2015-07-11 NOTE — Telephone Encounter (Signed)
Spoke with patient's spouse; advised her that per Dr Marin Olp Mr [name] [name]'s CT results are stable. Instructed her to keep appointment for 8/22 as previously scheduled. Patient's spouse verbalized understanding.

## 2015-07-11 NOTE — Telephone Encounter (Signed)
-----   Message from [name], MD sent at 07/11/2015 12:04 PM EDT ----- Call - No change in the scan!!  Everything is stable - No growth!!  [name] [name]

## 2015-07-12 LAB — PROTEIN ELECTROPHORESIS, SERUM, WITH REFLEX
Albumin ELP: 3.8 g/dL (ref 3.8–4.8)
Alpha-1-Globulin: 0.4 g/dL — ABNORMAL HIGH (ref 0.2–0.3)
Alpha-2-Globulin: 1 g/dL — ABNORMAL HIGH (ref 0.5–0.9)
BETA 2: 0.2 g/dL (ref 0.2–0.5)
Beta Globulin: 0.4 g/dL (ref 0.4–0.6)
GAMMA GLOBULIN: 0.3 g/dL — AB (ref 0.8–1.7)
TOTAL PROTEIN, SERUM ELECTROPHOR: 6 g/dL — AB (ref 6.1–8.1)

## 2015-07-12 LAB — BETA 2 MICROGLOBULIN, SERUM: BETA 2 MICROGLOBULIN: 4.52 mg/L — AB (ref <=2.51)

## 2015-07-12 LAB — LACTATE DEHYDROGENASE: LDH: 185 U/L (ref 94–250)

## 2015-07-18 ENCOUNTER — Other Ambulatory Visit: Payer: Medicare Other

## 2015-07-18 ENCOUNTER — Encounter: Payer: Self-pay | Admitting: Hematology & Oncology

## 2015-07-18 ENCOUNTER — Ambulatory Visit (HOSPITAL_BASED_OUTPATIENT_CLINIC_OR_DEPARTMENT_OTHER): Payer: Medicare Other | Admitting: Hematology & Oncology

## 2015-07-18 VITALS — BP 134/69 | HR 64 | Temp 97.2°F | Resp 16 | Ht 67.0 in | Wt 182.0 lb

## 2015-07-18 DIAGNOSIS — Z8551 Personal history of malignant neoplasm of bladder: Secondary | ICD-10-CM | POA: Diagnosis not present

## 2015-07-18 DIAGNOSIS — C8590 Non-Hodgkin lymphoma, unspecified, unspecified site: Secondary | ICD-10-CM

## 2015-07-18 DIAGNOSIS — C9110 Chronic lymphocytic leukemia of B-cell type not having achieved remission: Secondary | ICD-10-CM

## 2015-07-18 DIAGNOSIS — R590 Localized enlarged lymph nodes: Secondary | ICD-10-CM

## 2015-07-18 MED ORDER — PREDNISONE 5 MG PO TABS: 5.0000 mg | ORAL_TABLET | Freq: Two times a day (BID) | ORAL | Status: DC

## 2015-07-18 NOTE — Progress Notes (Signed)
Hematology and Oncology Follow Up Visit  [name] [name] [id number] 1931/06/07 81 y.o. 07/18/2015   Principle Diagnosis:   Abdominal/mesenteric lymphadenopathy-  CLL or low-grade lymphoma  History of superficial bladder cancer  Current Therapy:    Observation     Interim History:  [name] is back for follow-up. We did go ahead and get a CT scan on him . Thankfully, the CT scan did not show any evidence of progressive lymphadenopathy. He has lymphadenopathy within the abdomen and pelvis. However, the lymph nodes are not that large. They have not grown. There are no new areas. There is no splenomegaly.  He is not having any abdominal pain. He's having no problems going to the bathroom.  He's had no cough or shortness of breath. He's not had any palpable lymph glands on his neck.  There's been no change in his medications.  He says that the prednisone that he is on works very well for his left knee.  Overall, his performance status is ECOG 2.  Medications:  Current outpatient prescriptions:  .  allopurinol (ZYLOPRIM) 100 MG tablet, Take 100 mg by mouth daily as needed (for flareup). , Disp: , Rfl:  .  amLODipine (NORVASC) 5 MG tablet, TAKE ONE TABLET DAILY, Disp: 60 tablet, Rfl: 0 .  aspirin EC 81 MG tablet, Take 81 mg by mouth daily., Disp: , Rfl:  .  calcium carbonate (TUMS - DOSED IN MG ELEMENTAL CALCIUM) 500 MG chewable tablet, Chew 1 tablet by mouth as needed for indigestion or heartburn., Disp: , Rfl:  .  clopidogrel (PLAVIX) 75 MG tablet, TAKE ONE TABLET DAILY WITH BREAKFAST, Disp: 30 tablet, Rfl: 6 .  diphenhydrAMINE (BENADRYL) 25 MG tablet, Take 25 mg by mouth at bedtime. , Disp: , Rfl:  .  enalapril (VASOTEC) 10 MG tablet, TAKE 1 TABLET DAILY., Disp: 90 tablet, Rfl: 1 .  hydrochlorothiazide (HYDRODIURIL) 25 MG tablet, TAKE ONE TABLET DAILY and take an extra tablet if swelling increases, Disp: 60 tablet, Rfl: 5 .  metoprolol (LOPRESSOR) 50 MG tablet, TAKE 1/2  TABLET TWICE A DAY NEED OFFICE VISIT BEFORE ANY MORE REFILLS, Disp: 30 tablet, Rfl: 0 .  nitroGLYCERIN (NITROSTAT) 0.4 MG SL tablet, Place 0.4 mg under the tongue every 5 (five) minutes as needed for chest pain., Disp: , Rfl:  .  Omega-3 Fatty Acids (FISH OIL) 1200 MG CAPS, Take 1,200 mg by mouth 2 (two) times daily. , Disp: , Rfl:  .  predniSONE (DELTASONE) 5 MG tablet, Take 1 tablet (5 mg total) by mouth 2 (two) times daily with a meal., Disp: 60 tablet, Rfl: 2 .  predniSONE (DELTASONE) 50 MG tablet, Take 1 tablet 13 hours, 7 hours and 1 hour prior to CT scan., Disp: 3 tablet, Rfl: 2 .  Red Yeast Rice 600 MG TABS, Take 1,200 mg by mouth at bedtime. , Disp: , Rfl:  .  Tamsulosin HCl (FLOMAX) 0.4 MG CAPS, Take 0.4 mg by mouth daily as needed (for urination). , Disp: , Rfl:   Allergies:  Allergies  Allergen Reactions  . Uloric [Febuxostat] Other (See Comments)    Upper body paralysis-temporary  . Ivp Dye [Iodinated Diagnostic Agents] Other (See Comments)    Patient continued sneezing   . Statins Other (See Comments)    Patient unable to walk    Past Medical History, Surgical history, Social history, and Family History were reviewed and updated.  Review of Systems: As above  Physical Exam:  height is 5\' 7"  (1.702 m)  and weight is 182 lb (82.555 kg). His oral temperature is 97.2 F (36.2 C). His blood pressure is 134/69 and his pulse is 64. His respiration is 16.   Wt Readings from Last 3 Encounters:  07/18/15 182 lb (82.555 kg)  12/09/14 182 lb (82.555 kg)  10/13/14 186 lb 12.8 oz (84.732 kg)     Well-developed and well-nourished elderly gentleman. Head and neck exam shows no ocular or oral lesions. There is no adenopathy in the neck or super clicker regions. Lungs are with some decrease of the bases. Cardiac exam regular rate and rhythm with no murmurs, rubs or bruits. Axillary exam does show some small left axillary lymph nodes that measure about 1 cm and are mobile and nontender.  Abdomen is soft. Has good bowel sounds. There is no fluid wave. There is no palpable liver or spleen tip. There is no inguinal adenopathy. Extremities shows no clubbing, cyanosis or edema. Has some age related changes in his joints. Skin exam shows no rashes, ecchymoses or petechia.  Lab Results  Component Value Date   WBC 28.5* 07/08/2015   HGB 11.6* 07/08/2015   HCT 35.5* 07/08/2015   MCV 98 07/08/2015   PLT 186 07/08/2015     Chemistry      Component Value Date/Time   NA 141 07/08/2015 1101   NA 140 03/10/2015 0950   NA 138 10/12/2013 1518   K 4.3 07/08/2015 1101   K 4.5 03/10/2015 0950   K 3.8 10/12/2013 1518   CL 101 03/10/2015 0950   CL 104 10/12/2013 1518   CO2 22 07/08/2015 1101   CO2 26 03/10/2015 0950   CO2 25 10/12/2013 1518   BUN 29.7* 07/08/2015 1101   BUN 27* 03/10/2015 0950   BUN 28* 10/12/2013 1518   CREATININE 1.7* 07/08/2015 1101   CREATININE 1.6* 03/10/2015 0950   CREATININE 1.6* 10/12/2013 1518      Component Value Date/Time   CALCIUM 9.7 07/08/2015 1101   CALCIUM 9.8 03/10/2015 0950   CALCIUM 9.4 10/12/2013 1518   ALKPHOS 61 07/08/2015 1101   ALKPHOS 61 03/10/2015 0950   ALKPHOS 60 08/31/2013 0111   AST 14 07/08/2015 1101   AST 18 03/10/2015 0950   AST 16 08/31/2013 0111   ALT 12 07/08/2015 1101   ALT 17 03/10/2015 0950   ALT 14 08/31/2013 0111   BILITOT 0.34 07/08/2015 1101   BILITOT 0.60 03/10/2015 0950   BILITOT 0.4 08/31/2013 0111         Impression and Plan: Mr. Ketchum is 81 year old gentleman. He is totally asymptomatic. He has low-grade non-Hodgkin lymphoma or CLL.  Again, I think the real key here is the fact that he is asymptomatic.  For now, I think we'll probably move his scans out to 6 months. I think this would be very reasonable.  If he ever starts to have symptoms, then we can certainly treat him.  I gave him a copy of the labs and the scan results.  We will see him back a week after his scan is done in 6  months   [name], MD 8/22/201611:16 AM

## 2015-07-18 NOTE — Addendum Note (Signed)
Addended by: Burney Gauze R on: 07/18/2015 11:24 AM   Modules accepted: Orders

## 2015-07-25 ENCOUNTER — Other Ambulatory Visit: Payer: Self-pay | Admitting: Cardiology

## 2015-07-25 NOTE — Telephone Encounter (Signed)
REFILL 

## 2015-07-26 ENCOUNTER — Other Ambulatory Visit: Payer: Self-pay | Admitting: *Deleted

## 2015-07-26 DIAGNOSIS — C9110 Chronic lymphocytic leukemia of B-cell type not having achieved remission: Secondary | ICD-10-CM

## 2015-07-26 DIAGNOSIS — C8590 Non-Hodgkin lymphoma, unspecified, unspecified site: Secondary | ICD-10-CM

## 2015-07-26 DIAGNOSIS — R590 Localized enlarged lymph nodes: Secondary | ICD-10-CM

## 2015-07-26 MED ORDER — PREDNISONE 5 MG PO TABS: 5.0000 mg | ORAL_TABLET | Freq: Two times a day (BID) | ORAL | Status: DC

## 2015-07-27 ENCOUNTER — Encounter: Payer: Self-pay | Admitting: Cardiology

## 2015-07-27 ENCOUNTER — Ambulatory Visit (INDEPENDENT_AMBULATORY_CARE_PROVIDER_SITE_OTHER): Payer: Medicare Other | Admitting: Cardiology

## 2015-07-27 VITALS — BP 132/66 | HR 61 | Ht 67.0 in | Wt 184.8 lb

## 2015-07-27 DIAGNOSIS — I10 Essential (primary) hypertension: Secondary | ICD-10-CM | POA: Diagnosis not present

## 2015-07-27 DIAGNOSIS — I2510 Atherosclerotic heart disease of native coronary artery without angina pectoris: Secondary | ICD-10-CM | POA: Diagnosis not present

## 2015-07-27 MED ORDER — CLOPIDOGREL BISULFATE 75 MG PO TABS: ORAL_TABLET | ORAL | Status: DC

## 2015-07-27 MED ORDER — AMLODIPINE BESYLATE 5 MG PO TABS: 5.0000 mg | ORAL_TABLET | Freq: Every day | ORAL | Status: AC

## 2015-07-27 MED ORDER — ENALAPRIL MALEATE 10 MG PO TABS: 10.0000 mg | ORAL_TABLET | Freq: Every day | ORAL | Status: AC

## 2015-07-27 MED ORDER — NITROGLYCERIN 0.4 MG SL SUBL: 0.4000 mg | SUBLINGUAL_TABLET | SUBLINGUAL | Status: AC | PRN

## 2015-07-27 MED ORDER — METOPROLOL TARTRATE 50 MG PO TABS: 25.0000 mg | ORAL_TABLET | Freq: Two times a day (BID) | ORAL | Status: AC

## 2015-07-27 MED ORDER — HYDROCHLOROTHIAZIDE 25 MG PO TABS: ORAL_TABLET | ORAL | Status: DC

## 2015-07-27 NOTE — Assessment & Plan Note (Signed)
Continue aspirin and statin. 

## 2015-07-27 NOTE — Patient Instructions (Signed)
Your physician wants you to follow-up in: 6 MONTHS WITH DR CRENSHAW You will receive a reminder letter in the mail two months in advance. If you don't receive a letter, please call our office to schedule the follow-up appointment.  

## 2015-07-27 NOTE — Assessment & Plan Note (Signed)
Blood pressure controlled. Continue present medications. 

## 2015-07-27 NOTE — Assessment & Plan Note (Signed)
Continue diet. Intolerant to statins. 

## 2015-07-27 NOTE — Progress Notes (Signed)
HPI: FU coronary artery disease. He has a history of myocardial infarction and coronary artery bypass grafting in 1986 and he had a redo coronary artery bypass grafting in 1996. Note he had an abdominal ultrasound in June of 2008 that showed no aneurysm. Admitted in October of 2014 with chest pain and ruled in. Cardiac catheterization in October 2014 showed a normal left main, occluded LAD, circumflex and right coronary artery. The sequential saphenous vein graft to the first and second obtuse marginals had a 90% in-stent restenosis followed by a second 90% lesion. The free radial graft to the right coronary artery was patent. LIMA to LAD was patent. Patient had PCI of the saphenous vein graft to his marginals with a drug-eluting stent. Echocardiogram in October of 2014 showed normal LV function, grade 1 diastolic dysfunction, mild left atrial enlargement and trace mitral regurgitation. He has also had a history of a carotid endarterectomy and his most recent carotid Doppler performed in Dec 2015 showed a < 50% bilateral stenosis. Followup recommended in 12 months. Since I last saw him, he denies dyspnea, chest pain, palpitations or syncope.  Current Outpatient Prescriptions  Medication Sig Dispense Refill  . allopurinol (ZYLOPRIM) 100 MG tablet Take 100 mg by mouth daily as needed (for flareup).     Marland Kitchen amLODipine (NORVASC) 5 MG tablet TAKE ONE TABLET DAILY 60 tablet 0  . aspirin EC 81 MG tablet Take 81 mg by mouth daily.    . calcium carbonate (TUMS - DOSED IN MG ELEMENTAL CALCIUM) 500 MG chewable tablet Chew 1 tablet by mouth as needed for indigestion or heartburn.    . clopidogrel (PLAVIX) 75 MG tablet TAKE ONE TABLET DAILY WITH BREAKFAST 30 tablet 6  . diphenhydrAMINE (BENADRYL) 25 MG tablet Take 25 mg by mouth at bedtime.     . enalapril (VASOTEC) 10 MG tablet TAKE 1 TABLET DAILY. 90 tablet 1  . hydrochlorothiazide (HYDRODIURIL) 25 MG tablet TAKE ONE TABLET DAILY and take an extra tablet if  swelling increases 60 tablet 5  . metoprolol (LOPRESSOR) 50 MG tablet Take 0.5 tablets (25 mg total) by mouth 2 (two) times daily. NEED OV. 15 tablet 0  . nitroGLYCERIN (NITROSTAT) 0.4 MG SL tablet Place 0.4 mg under the tongue every 5 (five) minutes as needed for chest pain.    . Omega-3 Fatty Acids (FISH OIL) 1200 MG CAPS Take 1,200 mg by mouth 2 (two) times daily.     . predniSONE (DELTASONE) 5 MG tablet Take 1 tablet (5 mg total) by mouth 2 (two) times daily with a meal. 90 tablet 2  . Red Yeast Rice 600 MG TABS Take 1,200 mg by mouth at bedtime.     . Tamsulosin HCl (FLOMAX) 0.4 MG CAPS Take 0.4 mg by mouth daily as needed (for urination).      No current facility-administered medications for this visit.     Past Medical History  Diagnosis Date  . HYPERLIPIDEMIA   . GOUT   . HYPERTENSION   . CAD     a. s/p CABG x 4 1988;  b. s/p redo CABG x 4 1996;  c. 04/2006 Cath/PCI: LM nl, LAD 100p, LCX 100, RCA 100, LIMA->LAD 40 anast, VG->LCX 95p (3.5x24 [employer] BMS), RIMA->RCA ok;  d. 12/2012 MV: smal area of ischemia in basal inferiolateral wall.  . TRANSIENT ISCHEMIC ATTACK   . NEPHROLITHIASIS   . CKD (chronic kidney disease), stage III   . CONTRAST DYE ALLERGY, HX OF   .  HOH (hard of hearing)     a. uses hearing aids  . Cancer 2013    bladder cancer, skin cancer    Past Surgical History  Procedure Laterality Date  . Coronary artery bypass graft  1986  . Carotid endarterectomy    . Coronary artery bypass graft  1996  . Total knee arthroplasty  10/2009    rt  . Mass excision  05/27/2012    Procedure: EXCISION MASS;  Surgeon: Theodoro Kos, DO;  Location: [facility];  Service: Plastics;  Laterality: N/A;  repair of upper lip defect pt is post op  excision of skin cancer with tissue rearrangement or lip switch.  . Left heart catheterization with coronary/graft angiogram N/A 09/01/2013    Procedure: LEFT HEART CATHETERIZATION WITH Beatrix Fetters;  Surgeon: Peter M  Martinique, MD;  Location[facility] C[facility] LAB;  Service: Cardiovascular;  Laterality: N/A;    Social History   Social History  . Marital Status: Married    Spouse Name: N/A  . Number of Children: N/A  . Years of Education: N/A   Occupational History  . Not on file.   Social History Main Topics  . Smoking status: Former Smoker -- 1.00 packs/day for 13 years    Types: Cigarettes    Start date: 02/06/1953    Quit date: 11/26/1965  . Smokeless tobacco: Never Used     Comment: quit smoking 46 years ago  . Alcohol Use: No  . Drug Use: No  . Sexual Activity: Not on file   Other Topics Concern  . Not on file   Social History Narrative    ROS: no fevers or chills, productive cough, hemoptysis, dysphasia, odynophagia, melena, hematochezia, dysuria, hematuria, rash, seizure activity, orthopnea, PND, pedal edema, claudication. Remaining systems are negative.  Physical Exam: Well-developed well-nourished in no acute distress.  Skin is warm and dry.  HEENT is normal.  Neck is supple.  Chest is clear to auscultation with normal expansion.  Cardiovascular exam is irregular Abdominal exam nontender or distended. No masses palpated. Extremities show no edema. neuro grossly intact    ECG sinus rhythm with occasional PVCs. Inferior infarct.

## 2015-07-27 NOTE — Assessment & Plan Note (Signed)
Follow-up carotid Dopplers December 2016. 

## 2015-08-23 ENCOUNTER — Other Ambulatory Visit: Payer: Self-pay | Admitting: Dermatology

## 2015-10-03 ENCOUNTER — Emergency Department (HOSPITAL_COMMUNITY): Payer: Medicare Other

## 2015-10-03 ENCOUNTER — Inpatient Hospital Stay (HOSPITAL_COMMUNITY)
Admission: EM | Admit: 2015-10-03 | Discharge: 2015-10-05 | DRG: 194 | Disposition: A | Discharge status: Home / Self Care | Payer: Medicare Other | Attending: Internal Medicine | Admitting: Internal Medicine

## 2015-10-03 ENCOUNTER — Encounter (HOSPITAL_COMMUNITY): Payer: Self-pay | Admitting: *Deleted

## 2015-10-03 DIAGNOSIS — M109 Gout, unspecified: Secondary | ICD-10-CM | POA: Diagnosis present

## 2015-10-03 DIAGNOSIS — Z91041 Radiographic dye allergy status: Secondary | ICD-10-CM | POA: Diagnosis not present

## 2015-10-03 DIAGNOSIS — E785 Hyperlipidemia, unspecified: Secondary | ICD-10-CM | POA: Diagnosis present

## 2015-10-03 DIAGNOSIS — N400 Enlarged prostate without lower urinary tract symptoms: Secondary | ICD-10-CM | POA: Diagnosis present

## 2015-10-03 DIAGNOSIS — I130 Hypertensive heart and chronic kidney disease with heart failure and stage 1 through stage 4 chronic kidney disease, or unspecified chronic kidney disease: Secondary | ICD-10-CM | POA: Diagnosis present

## 2015-10-03 DIAGNOSIS — I4581 Long QT syndrome: Secondary | ICD-10-CM | POA: Diagnosis present

## 2015-10-03 DIAGNOSIS — Z7982 Long term (current) use of aspirin: Secondary | ICD-10-CM | POA: Diagnosis not present

## 2015-10-03 DIAGNOSIS — Z85828 Personal history of other malignant neoplasm of skin: Secondary | ICD-10-CM | POA: Diagnosis not present

## 2015-10-03 DIAGNOSIS — R0781 Pleurodynia: Secondary | ICD-10-CM

## 2015-10-03 DIAGNOSIS — I2510 Atherosclerotic heart disease of native coronary artery without angina pectoris: Secondary | ICD-10-CM | POA: Diagnosis present

## 2015-10-03 DIAGNOSIS — J45909 Unspecified asthma, uncomplicated: Secondary | ICD-10-CM | POA: Diagnosis present

## 2015-10-03 DIAGNOSIS — Z951 Presence of aortocoronary bypass graft: Secondary | ICD-10-CM | POA: Diagnosis not present

## 2015-10-03 DIAGNOSIS — Z8673 Personal history of transient ischemic attack (TIA), and cerebral infarction without residual deficits: Secondary | ICD-10-CM | POA: Diagnosis not present

## 2015-10-03 DIAGNOSIS — J180 Bronchopneumonia, unspecified organism: Secondary | ICD-10-CM | POA: Diagnosis present

## 2015-10-03 DIAGNOSIS — Z8551 Personal history of malignant neoplasm of bladder: Secondary | ICD-10-CM | POA: Diagnosis not present

## 2015-10-03 DIAGNOSIS — Z96651 Presence of right artificial knee joint: Secondary | ICD-10-CM | POA: Diagnosis present

## 2015-10-03 DIAGNOSIS — H9190 Unspecified hearing loss, unspecified ear: Secondary | ICD-10-CM | POA: Diagnosis present

## 2015-10-03 DIAGNOSIS — D696 Thrombocytopenia, unspecified: Secondary | ICD-10-CM | POA: Diagnosis present

## 2015-10-03 DIAGNOSIS — Z955 Presence of coronary angioplasty implant and graft: Secondary | ICD-10-CM

## 2015-10-03 DIAGNOSIS — J189 Pneumonia, unspecified organism: Secondary | ICD-10-CM | POA: Diagnosis not present

## 2015-10-03 DIAGNOSIS — D649 Anemia, unspecified: Secondary | ICD-10-CM | POA: Diagnosis present

## 2015-10-03 DIAGNOSIS — Z87891 Personal history of nicotine dependence: Secondary | ICD-10-CM

## 2015-10-03 DIAGNOSIS — N183 Chronic kidney disease, stage 3 unspecified: Secondary | ICD-10-CM | POA: Diagnosis present

## 2015-10-03 DIAGNOSIS — I5032 Chronic diastolic (congestive) heart failure: Secondary | ICD-10-CM | POA: Diagnosis present

## 2015-10-03 DIAGNOSIS — Z7902 Long term (current) use of antithrombotics/antiplatelets: Secondary | ICD-10-CM | POA: Diagnosis not present

## 2015-10-03 DIAGNOSIS — Z79899 Other long term (current) drug therapy: Secondary | ICD-10-CM

## 2015-10-03 DIAGNOSIS — I10 Essential (primary) hypertension: Secondary | ICD-10-CM | POA: Diagnosis present

## 2015-10-03 DIAGNOSIS — C9110 Chronic lymphocytic leukemia of B-cell type not having achieved remission: Secondary | ICD-10-CM | POA: Diagnosis present

## 2015-10-03 DIAGNOSIS — R0600 Dyspnea, unspecified: Secondary | ICD-10-CM | POA: Diagnosis present

## 2015-10-03 HISTORY — DX: Unspecified asthma, uncomplicated: J45.909

## 2015-10-03 LAB — BASIC METABOLIC PANEL
ANION GAP: 10 (ref 5–15)
BUN: 31 mg/dL — AB (ref 6–20)
CHLORIDE: 103 mmol/L (ref 101–111)
CO2: 24 mmol/L (ref 22–32)
Calcium: 8.7 mg/dL — ABNORMAL LOW (ref 8.9–10.3)
Creatinine, Ser: 1.79 mg/dL — ABNORMAL HIGH (ref 0.61–1.24)
GFR calc Af Amer: 39 mL/min — ABNORMAL LOW (ref >60)
GFR calc non Af Amer: 34 mL/min — ABNORMAL LOW (ref >60)
Glucose, Bld: 139 mg/dL — ABNORMAL HIGH (ref 65–99)
POTASSIUM: 3.8 mmol/L (ref 3.5–5.1)
SODIUM: 137 mmol/L (ref 135–145)

## 2015-10-03 LAB — CBC
HEMATOCRIT: 36.5 % — AB (ref 39.0–52.0)
HEMOGLOBIN: 11.5 g/dL — AB (ref 13.0–17.0)
MCH: 30.4 pg (ref 26.0–34.0)
MCHC: 31.5 g/dL (ref 30.0–36.0)
MCV: 96.6 fL (ref 78.0–100.0)
Platelets: 179 10*3/uL (ref 150–400)
RBC: 3.78 MIL/uL — ABNORMAL LOW (ref 4.22–5.81)
RDW: 16 % — AB (ref 11.5–15.5)
WBC: 26.6 10*3/uL — AB (ref 4.0–10.5)

## 2015-10-03 LAB — I-STAT TROPONIN, ED: Troponin i, poc: 0.01 ng/mL (ref 0.00–0.08)

## 2015-10-03 LAB — BRAIN NATRIURETIC PEPTIDE: B NATRIURETIC PEPTIDE 5: 351.8 pg/mL — AB (ref 0.0–100.0)

## 2015-10-03 MED ORDER — DIPHENHYDRAMINE HCL 50 MG/ML IJ SOLN
50.0000 mg | Freq: Once | INTRAMUSCULAR | Status: AC
  Administered 2015-10-03: 50 mg via INTRAVENOUS
  Filled 2015-10-03: qty 1

## 2015-10-03 MED ORDER — IOHEXOL 350 MG/ML SOLN
100.0000 mL | Freq: Once | INTRAVENOUS | Status: AC | PRN
  Administered 2015-10-03: 70 mL via INTRAVENOUS

## 2015-10-03 MED ORDER — SODIUM CHLORIDE 0.9 % IV BOLUS (SEPSIS)
1000.0000 mL | Freq: Once | INTRAVENOUS | Status: AC
  Administered 2015-10-03: 1000 mL via INTRAVENOUS

## 2015-10-03 MED ORDER — IPRATROPIUM-ALBUTEROL 0.5-2.5 (3) MG/3ML IN SOLN
3.0000 mL | Freq: Once | RESPIRATORY_TRACT | Status: AC
  Administered 2015-10-03: 3 mL via RESPIRATORY_TRACT
  Filled 2015-10-03: qty 3

## 2015-10-03 MED ORDER — DEXTROSE 5 % IV SOLN
1.0000 g | Freq: Once | INTRAVENOUS | Status: AC
  Administered 2015-10-03: 1 g via INTRAVENOUS
  Filled 2015-10-03: qty 10

## 2015-10-03 MED ORDER — DEXTROSE 5 % IV SOLN
500.0000 mg | INTRAVENOUS | Status: DC
  Administered 2015-10-04 (×2): 500 mg via INTRAVENOUS
  Filled 2015-10-03 (×3): qty 500

## 2015-10-03 MED ORDER — LEVOFLOXACIN IN D5W 750 MG/150ML IV SOLN: 750.0000 mg | Freq: Once | INTRAVENOUS | Status: DC

## 2015-10-03 MED ORDER — LEVALBUTEROL HCL 0.63 MG/3ML IN NEBU
0.6300 mg | INHALATION_SOLUTION | Freq: Three times a day (TID) | RESPIRATORY_TRACT | Status: DC
  Administered 2015-10-03 – 2015-10-04 (×2): 0.63 mg via RESPIRATORY_TRACT
  Filled 2015-10-03 (×3): qty 3

## 2015-10-03 MED ORDER — HYDROCORTISONE NA SUCCINATE PF 100 MG IJ SOLR
200.0000 mg | Freq: Once | INTRAMUSCULAR | Status: AC
Start: 2015-10-03 — End: 2015-10-03
  Administered 2015-10-03: 200 mg via INTRAVENOUS
  Filled 2015-10-03: qty 4

## 2015-10-03 NOTE — ED Notes (Signed)
Pt reports onset of mid chest tightness that radiates into his back. Also had recent cough and cold symptoms, productive cough with white sputum. Denies fever. Pt has cardiac hx and hx of pneumonia.

## 2015-10-03 NOTE — Progress Notes (Signed)
Pt stated that he has cardiac hx. Has currently symptoms of a cold. Pt denies fever and chills. Pt states that he has had a dry cough for several days. Family at bedside. Pt is stable at this time.

## 2015-10-03 NOTE — ED Notes (Signed)
Phlebotomy at bedside to draw blood cultures.

## 2015-10-03 NOTE — ED Notes (Signed)
All meds given pre CT.

## 2015-10-03 NOTE — ED Notes (Signed)
Pt in xray

## 2015-10-03 NOTE — ED Provider Notes (Signed)
Arrival Date & Time: 10/03/15 & 1406 History   Chief Complaint  Patient presents with  . Cough  . Chest Pain   HPI [name] [name] is a 81 y.o. male who presents for 2 weeks of cough congestion and rhinorrhea and productive went sputum. Patient states that he is also developed chest tightness for the past week. No radiation into arms had mild radiation into back however no abnormal sensation or focal weakness. Patient states that he had 8 pills of amoxicillin left around from prior infection which she took however this did not help. Patient states she has no history of lung disease and has not smoked in the past 4 years. Patient has remarkable history for coronary artery disease and states that last left heart catheterization and angiogram was in 2014 which showed extensive coronary artery disease. Patient has chronic kidney disease and has had no fevers or chills recently. Denies abdominal pain back pain or urinary symptoms.  Past Medical History  I reviewed & agree with nursing's documentation on PMHx, PSHx, SHx and FHx. Past Medical History  Diagnosis Date  . HYPERLIPIDEMIA   . GOUT   . HYPERTENSION   . CAD     a. s/p CABG x 4 1988;  b. s/p redo CABG x 4 1996;  c. 04/2006 Cath/PCI: LM nl, LAD 100p, LCX 100, RCA 100, LIMA->LAD 40 anast, VG->LCX 95p (3.5x24 [employer] BMS), RIMA->RCA ok;  d. 12/2012 MV: smal area of ischemia in basal inferiolateral wall.  . TRANSIENT ISCHEMIC ATTACK   . NEPHROLITHIASIS   . CKD (chronic kidney disease), stage III   . CONTRAST DYE ALLERGY, HX OF   . HOH (hard of hearing)     a. uses hearing aids  . Cancer [facility]) 2013    bladder cancer, skin cancer  . Asthma    Past Surgical History  Procedure Laterality Date  . Coronary artery bypass graft  1986  . Carotid endarterectomy    . Coronary artery bypass graft  1996  . Total knee arthroplasty  10/2009    rt  . Mass excision  05/27/2012    Procedure: EXCISION MASS;  Surgeon: Theodoro Kos, DO;  Location: [place];  Service: Plastics;  Laterality: N/A;  repair of upper lip defect pt is post op  excision of skin cancer with tissue rearrangement or lip switch.  . Left heart catheterization with coronary/graft angiogram N/A 09/01/2013    Procedure: LEFT HEART CATHETERIZATION WITH Beatrix Fetters;  Surgeon: Peter M Martinique, MD;  Location: [facility] CATH LAB;  Service: Cardiovascular;  Laterality: N/A;   Social History   Social History  . Marital Status: Married    Spouse Name: N/A  . Number of Children: N/A  . Years of Education: N/A   Social History Main Topics  . Smoking status: Former Smoker -- 1.00 packs/day for 13 years    Types: Cigarettes    Start date: 02/06/1953    Quit date: 11/26/1965  . Smokeless tobacco: Never Used     Comment: quit smoking 46 years ago  . Alcohol Use: No  . Drug Use: No  . Sexual Activity: Not Asked   Other Topics Concern  . None   Social History Narrative   Family History  Problem Relation Age of Onset  . Heart attack Father   . Heart disease Sister   . Stroke Brother     Review of Systems   Complete ROS performed by me, all positives & negatives documented as above  in HPI. All other ROS negative.  Allergies  Uloric; Ivp dye; and Statins  Home Medications   Prior to Admission medications   Medication Sig Start Date End Date Taking? Authorizing Provider  allopurinol (ZYLOPRIM) 100 MG tablet Take 100 mg by mouth daily as needed (for flareup).    Yes Historical Provider, MD  amLODipine (NORVASC) 5 MG tablet Take 1 tablet (5 mg total) by mouth daily. 07/27/15  Yes Lelon Perla, MD  aspirin EC 81 MG tablet Take 81 mg by mouth daily.   Yes Historical Provider, MD  calcium carbonate (TUMS - DOSED IN MG ELEMENTAL CALCIUM) 500 MG chewable tablet Chew 1 tablet by mouth at bedtime.    Yes Historical Provider, MD  clopidogrel (PLAVIX) 75 MG tablet TAKE ONE TABLET DAILY WITH BREAKFAST 07/27/15  Yes Lelon Perla, MD  diphenhydrAMINE  (BENADRYL) 25 MG tablet Take 25 mg by mouth at bedtime.    Yes Historical Provider, MD  enalapril (VASOTEC) 10 MG tablet Take 1 tablet (10 mg total) by mouth daily. 07/27/15  Yes Lelon Perla, MD  hydrochlorothiazide (HYDRODIURIL) 25 MG tablet TAKE ONE TABLET DAILY and take an extra tablet if swelling increases 07/27/15  Yes Lelon Perla, MD  metoprolol (LOPRESSOR) 50 MG tablet Take 0.5 tablets (25 mg total) by mouth 2 (two) times daily. 07/27/15  Yes Lelon Perla, MD  nitroGLYCERIN (NITROSTAT) 0.4 MG SL tablet Place 1 tablet (0.4 mg total) under the tongue every 5 (five) minutes as needed for chest pain. 07/27/15  Yes Lelon Perla, MD  Omega-3 Fatty Acids (FISH OIL) 1200 MG CAPS Take 1,200 mg by mouth 2 (two) times daily.    Yes Historical Provider, MD  predniSONE (DELTASONE) 5 MG tablet Take 1 tablet (5 mg total) by mouth 2 (two) times daily with a meal. Patient taking differently: Take 5 mg by mouth 2 (two) times daily as needed (leg pain).  07/26/15  Yes [name], MD  Red Yeast Rice 600 MG TABS Take 1,200 mg by mouth at bedtime.    Yes Historical Provider, MD  Tamsulosin HCl (FLOMAX) 0.4 MG CAPS Take 0.4 mg by mouth daily as needed (for urination).    Yes Historical Provider, MD  triamcinolone ointment (KENALOG) 0.1 % Apply 1 application topically 2 (two) times daily as needed (dermatitis).   Yes Historical Provider, MD    Physical Exam  BP 112/60 mmHg  Pulse 71  Temp(Src) 98.2 F (36.8 C) (Oral)  Resp 18  Ht 5\' 8"  (1.727 m)  Wt 183 lb 1.6 oz (83.054 kg)  BMI 27.85 kg/m2  SpO2 97% Physical Exam  Constitutional: He is oriented to person, place, and time. He appears well-developed and well-nourished.  Non-toxic appearance. He does not appear ill. No distress.  HENT:  Head: Normocephalic and atraumatic.  Right Ear: External ear normal.  Left Ear: External ear normal.  Mouth/Throat: Oropharynx is clear and moist.  Eyes: Pupils are equal, round, and reactive to light.  Right eye exhibits no discharge. Left eye exhibits no discharge. No scleral icterus.  Neck: Normal range of motion. Neck supple. No JVD present. No tracheal deviation present.  Cardiovascular: Normal heart sounds and intact distal pulses.   No murmur heard. Pulmonary/Chest: Effort normal. No stridor. No respiratory distress. He has no wheezes. He has no rales. He exhibits tenderness.  Coarse BS diffusely.  Abdominal: Soft. Bowel sounds are normal. He exhibits no distension. There is no tenderness. There is no rebound and no  guarding.  Musculoskeletal: Normal range of motion.  Neurological: He is alert and oriented to person, place, and time. He has normal strength and normal reflexes. No cranial nerve deficit or sensory deficit.  Skin: Skin is warm and dry. No pallor.  Psychiatric: He has a normal mood and affect. His behavior is normal.  Nursing note and vitals reviewed.  ED Course  Procedures Labs Review Labs Reviewed  BASIC METABOLIC PANEL - Abnormal; Notable for the following:    Glucose, Bld 139 (*)    BUN 31 (*)    Creatinine, Ser 1.79 (*)    Calcium 8.7 (*)    GFR calc non Af Amer 34 (*)    GFR calc Af Amer 39 (*)    All other components within normal limits  CBC - Abnormal; Notable for the following:    WBC 26.6 (*)    RBC 3.78 (*)    Hemoglobin 11.5 (*)    HCT 36.5 (*)    RDW 16.0 (*)    All other components within normal limits  BRAIN NATRIURETIC PEPTIDE - Abnormal; Notable for the following:    B Natriuretic Peptide 351.8 (*)    All other components within normal limits  URINALYSIS, ROUTINE W REFLEX MICROSCOPIC (NOT AT [facility]) - Abnormal; Notable for the following:    Glucose, UA 500 (*)    Ketones, ur 15 (*)    All other components within normal limits  COMPREHENSIVE METABOLIC PANEL - Abnormal; Notable for the following:    CO2 20 (*)    Glucose, Bld 227 (*)    BUN 25 (*)    Creatinine, Ser 1.68 (*)    Calcium 8.2 (*)    Total Protein 5.2 (*)    Albumin 3.0 (*)     ALT 12 (*)    GFR calc non Af Amer 36 (*)    GFR calc Af Amer 42 (*)    All other components within normal limits  CBC - Abnormal; Notable for the following:    WBC 12.3 (*)    RBC 3.43 (*)    Hemoglobin 10.5 (*)    HCT 32.6 (*)    RDW 16.1 (*)    Platelets 146 (*)    All other components within normal limits  GLUCOSE, CAPILLARY - Abnormal; Notable for the following:    Glucose-Capillary 112 (*)    All other components within normal limits  CBC WITH DIFFERENTIAL/PLATELET - Abnormal; Notable for the following:    WBC 26.3 (*)    RBC 3.37 (*)    Hemoglobin 10.4 (*)    HCT 32.6 (*)    RDW 16.3 (*)    All other components within normal limits  CULTURE, BLOOD (ROUTINE X 2)  CULTURE, BLOOD (ROUTINE X 2)  URINE CULTURE  CULTURE, EXPECTORATED SPUTUM-ASSESSMENT  GRAM STAIN  STREP PNEUMONIAE URINARY ANTIGEN  TROPONIN I  TROPONIN I  MAGNESIUM  LEGIONELLA PNEUMOPHILA SEROGP 1 UR AG  IMMUNOGLOBULINS A/E/G/M, SERUM  I-STAT TROPOININ, ED   Imaging Review Dg Chest 2 View  10/03/2015  CLINICAL DATA:  Chest tightness for 4 days. History of prior bypass surgery. EXAM: CHEST  2 VIEW COMPARISON:  PA and lateral chest 08/31/2013. FINDINGS: The patient is status post CABG. The lungs are clear. Heart size is normal. No pneumothorax or pleural effusion. No focal bony abnormality. IMPRESSION: No acute disease. Electronically Signed   By: Inge Rise M.D.   On: 10/03/2015 15:20   Ct Angio Chest Pe W/cm &/or Wo Cm  10/03/2015  CLINICAL DATA:  Pleuritic chest pain. Cold like symptoms. Leukocytosis. EXAM: CT ANGIOGRAPHY CHEST WITH CONTRAST TECHNIQUE: Multidetector CT imaging of the chest was performed using the standard protocol during bolus administration of intravenous contrast. Multiplanar CT image reconstructions and MIPs were obtained to evaluate the vascular anatomy. CONTRAST:  61mL OMNIPAQUE IOHEXOL 350 MG/ML SOLN COMPARISON:  None. FINDINGS: THORACIC INLET/BODY WALL: Bilateral axillary  lymphadenopathy with globular homogeneous appearance. Index node in the superficial right axilla measures 31 x 16 mm on series 4, image 38. MEDIASTINUM: Cardiomegaly without pericardial effusion. Diffuse atherosclerosis, status post CABG. Due to motion artifact, detection of enhancement within saphenous vein grafts is limited. Limited opacification of the aorta and branches with no evidence of dissection or other acute systemic arterial finding. No evidence of pulmonary embolism. Small sliding-type hiatal hernia. Hilar and mediastinal lymphadenopathy with similar appearance to the bulky axillary nodes. LUNG WINDOWS: Bibasilar bandlike opacities with volume loss. There is also airspace disease and bronchial wall thickening with multifocal mucoid impaction. No pulmonary edema or pleural effusion. UPPER ABDOMEN: Upper abdominal lymphadenopathy as recently evaluated by CT. OSSEOUS: No acute fracture.  No suspicious lytic or blastic lesions. Diffuse thoracic spondylosis. Notable T3 superior endplate Schmorl's node which appears chronic. Review of the MIP images confirms the above findings. IMPRESSION: 1. Bilateral lower lobe bronchopneumonia and atelectasis. 2. Lymphomatous appearing bulky intrathoracic and axillary lymphadenopathy. 3. No evidence of pulmonary embolism. Electronically Signed   By: Monte Fantasia M.D.   On: 10/03/2015 21:11   Laboratory and Imaging results were personally reviewed by myself and used in the medical decision making of this patient's treatment and disposition.  EKG Interpretation  EKG Interpretation  Date/Time:  Monday October 03 2015 14:37:43 EST Ventricular Rate:  73 PR Interval:  146 QRS Duration: 124 QT Interval:  460 QTC Calculation: 506 R Axis:   53 Text Interpretation:  Sinus rhythm with frequent Premature ventricular complexes Possible Inferior infarct , age undetermined Abnormal ECG No significant change since last tracing Confirmed by Lonia Skinner ([id number]) on  10/03/2015 5:11:37 PM      MDM  Bronson Curb is a 81 y.o. male with H&P as above. ED clinical course as follows:  Patient with PMHx remarkable for known CLL/Non-Hodgkins Lymphoma. Patient not on active chemotherapy. And CBC reveals no Neutropenia. Remarkable for significant leukocytosis.   CXR negative for obvious concerns however due to active cancer CTA PE obtained to look for occult PNA vs acute PE. CT reveals bronchopneumonia therefore patient given azithromycin and rocephin for CAP.    Initial ECG reveals no evidence of acute STEMI or STEMI equivalent. Troponin negative.  Labs reveal no acute abnormalities requiring intervention or that would be the likely culprit of today's symptoms.  Disposition: Admit  I obtained consultation with the hospitalist service for concerns of high risk CAP. I discussed the patients clinical course including their H&P, as well as, their diagnostic studies. Based upon that discussion, we've decided that the patient will require admission for continued evaluation and consideration for CLL treatment.  Clinical Impression:  1. Pleuritic chest pain    Patient care discussed with Dr. Alfonse Spruce, who oversaw their evaluation & treatment & voiced agreement. House Officer: Voncille Lo, MD, Emergency Medicine.  Voncille Lo, MD 10/05/15 [id number]  Harvel Quale, MD 10/05/15 [phone]

## 2015-10-03 NOTE — H&P (Signed)
PCP:  Olin Hauser, MD  Cardiology [facility]  Referring provider  Freda Munro   Chief Complaint:  cough  HPI: [name] [name] is a 81 y.o. male   has a past medical history of HYPERLIPIDEMIA; GOUT; HYPERTENSION; CAD; TRANSIENT ISCHEMIC ATTACK; NEPHROLITHIASIS; CKD (chronic kidney disease), stage III; CONTRAST DYE ALLERGY, HX OF; HOH (hard of hearing); Cancer [facility]) (2013); and Asthma.   Presented with dyspnea and chest tightness the past 1 week worsening cough and sneezing Started with cold symptoms and progressed. Cough has been productive of white sputum. Patient endorses any fevers. Patient self treated with amoxicillin that she had around the house but did not seem to improve. He reports some pain in the chest with coughing.  In emergency department given history of dyspnea and past history of CLL patient had a CT scan of the chest done which showed bibasilar bronchopneumonia he was ordered one dose of Levaquin. Habits or color was noted to be 26.6 which is around his baseline.  platelets 179 hemoglobin 11.5 creatinine was noted to be elevated 1.79 which is his baseline. Troponin was within normal limits no significant ECG changes.  CT scan of the chest also showed diffuse axillary as well as mediastinal lymphadenopathy possibly new   Patient also has history of CLL with chronic mesenteric lymphadenopathy in April patient was seen by oncology and felt to be symptomatic not require any treatment at that time plan to continue to follow.   Patient has history of coronary disease status post cardiac bypass in 1986 and 1996 last cardiac catheterization 2014 requiring stent placement of note last echogram was in 2014 showing grade 1 diastolic dysfunction Hospitalist was called for admission for CAP  Review of Systems:    Pertinent positives include: shortness of breath at rest.productive cough,  excess mucus,  Constitutional:  No weight loss, night sweats, Fevers,  chills, fatigue, weight loss  HEENT:  No headaches, Difficulty swallowing,Tooth/dental problems,Sore throat,  No sneezing, itching, ear ache, nasal congestion, post nasal drip,  Cardio-vascular:  No chest pain, Orthopnea, PND, anasarca, dizziness, palpitations.no Bilateral lower extremity swelling  GI:  No heartburn, indigestion, abdominal pain, nausea, vomiting, diarrhea, change in bowel habits, loss of appetite, melena, blood in stool, hematemesis Resp:  no  No dyspnea on exertion, No no No non-productive cough, No coughing up of blood.No change in color of mucus.No wheezing. Skin:  no rash or lesions. No jaundice GU:  no dysuria, change in color of urine, no urgency or frequency. No straining to urinate.  No flank pain.  Musculoskeletal:  No joint pain or no joint swelling. No decreased range of motion. No back pain.  Psych:  No change in mood or affect. No depression or anxiety. No memory loss.  Neuro: no localizing neurological complaints, no tingling, no weakness, no double vision, no gait abnormality, no slurred speech, no confusion  Otherwise ROS are negative except for above, 10 systems were reviewed  Past Medical History: Past Medical History  Diagnosis Date  . HYPERLIPIDEMIA   . GOUT   . HYPERTENSION   . CAD     a. s/p CABG x 4 1988;  b. s/p redo CABG x 4 1996;  c. 04/2006 Cath/PCI: LM nl, LAD 100p, LCX 100, RCA 100, LIMA->LAD 40 anast, VG->LCX 95p (3.5x24 [facility] BMS), RIMA->RCA ok;  d. 12/2012 MV: smal area of ischemia in basal inferiolateral wall.  . TRANSIENT ISCHEMIC ATTACK   . NEPHROLITHIASIS   . CKD (chronic kidney disease), stage III   .  CONTRAST DYE ALLERGY, HX OF   . HOH (hard of hearing)     a. uses hearing aids  . Cancer [facility]) 2013    bladder cancer, skin cancer  . Asthma    Past Surgical History  Procedure Laterality Date  . Coronary artery bypass graft  1986  . Carotid endarterectomy    . Coronary artery bypass graft  1996  . Total knee  arthroplasty  10/2009    rt  . Mass excision  05/27/2012    Procedure: EXCISION MASS;  Surgeon: Theodoro Kos, DO;  Location: [facility];  Service: Plastics;  Laterality: N/A;  repair of upper lip defect pt is post op  excision of skin cancer with tissue rearrangement or lip switch.  . Left heart catheterization with coronary/graft angiogram N/A 09/01/2013    Procedure: LEFT HEART CATHETERIZATION WITH Beatrix Fetters;  Surgeon: Peter M Martinique, MD;  Location: [facility] C[facility] LAB;  Service: Cardiovascular;  Laterality: N/A;     Medications: Prior to Admission medications   Medication Sig Start Date End Date Taking? Authorizing Provider  allopurinol (ZYLOPRIM) 100 MG tablet Take 100 mg by mouth daily as needed (for flareup).    Yes Historical Provider, MD  amLODipine (NORVASC) 5 MG tablet Take 1 tablet (5 mg total) by mouth daily. 07/27/15  Yes Lelon Perla, MD  aspirin EC 81 MG tablet Take 81 mg by mouth daily.   Yes Historical Provider, MD  calcium carbonate (TUMS - DOSED IN MG ELEMENTAL CALCIUM) 500 MG chewable tablet Chew 1 tablet by mouth at bedtime.    Yes Historical Provider, MD  clopidogrel (PLAVIX) 75 MG tablet TAKE ONE TABLET DAILY WITH BREAKFAST 07/27/15  Yes Lelon Perla, MD  diphenhydrAMINE (BENADRYL) 25 MG tablet Take 25 mg by mouth at bedtime.    Yes Historical Provider, MD  enalapril (VASOTEC) 10 MG tablet Take 1 tablet (10 mg total) by mouth daily. 07/27/15  Yes Lelon Perla, MD  hydrochlorothiazide (HYDRODIURIL) 25 MG tablet TAKE ONE TABLET DAILY and take an extra tablet if swelling increases 07/27/15  Yes Lelon Perla, MD  metoprolol (LOPRESSOR) 50 MG tablet Take 0.5 tablets (25 mg total) by mouth 2 (two) times daily. 07/27/15  Yes Lelon Perla, MD  nitroGLYCERIN (NITROSTAT) 0.4 MG SL tablet Place 1 tablet (0.4 mg total) under the tongue every 5 (five) minutes as needed for chest pain. 07/27/15  Yes Lelon Perla, MD  Omega-3 Fatty Acids (FISH OIL)  1200 MG CAPS Take 1,200 mg by mouth 2 (two) times daily.    Yes Historical Provider, MD  predniSONE (DELTASONE) 5 MG tablet Take 1 tablet (5 mg total) by mouth 2 (two) times daily with a meal. Patient taking differently: Take 5 mg by mouth 2 (two) times daily as needed (leg pain).  07/26/15  Yes [name], MD  Red Yeast Rice 600 MG TABS Take 1,200 mg by mouth at bedtime.    Yes Historical Provider, MD  Tamsulosin HCl (FLOMAX) 0.4 MG CAPS Take 0.4 mg by mouth daily as needed (for urination).    Yes Historical Provider, MD  triamcinolone ointment (KENALOG) 0.1 % Apply 1 application topically 2 (two) times daily as needed (dermatitis).   Yes Historical Provider, MD    Allergies:   Allergies  Allergen Reactions  . Uloric [Febuxostat] Other (See Comments)    Pt states "it locked me up, I couldn't move from the waist up" Upper body paralysis-temporary  . Ivp Dye [Iodinated  Diagnostic Agents] Other (See Comments)    Patient continued sneezing   . Statins Other (See Comments)    Patient unable to walk    Social History:  Ambulatory  independently   Lives at home  With family     reports that he quit smoking about 49 years ago. His smoking use included Cigarettes. He started smoking about 62 years ago. He has a 13 pack-year smoking history. He has never used smokeless tobacco. He reports that he does not drink alcohol or use illicit drugs.    Family History: family history includes Heart attack in his father; Heart disease in his sister; Stroke in his brother.    Physical Exam: Patient Vitals for the past 24 hrs:  BP Temp Temp src Pulse Resp SpO2  10/03/15 2130 (!) 124/49 mmHg - - 73 22 94 %  10/03/15 2110 - - - - - 97 %  10/03/15 2100 127/58 mmHg - - 77 18 95 %  10/03/15 2000 (!) 125/50 mmHg - - 70 20 92 %  10/03/15 1930 135/56 mmHg - - 76 18 96 %  10/03/15 1700 138/61 mmHg - - 65 16 98 %  10/03/15 1615 (!) 124/45 mmHg - - 69 18 98 %  10/03/15 1600 (!) 135/53 mmHg - - 100 19  96 %  10/03/15 1545 125/61 mmHg - - 69 22 99 %  10/03/15 1530 129/77 mmHg - - (!) 40 21 99 %  10/03/15 1523 147/57 mmHg - - 65 18 100 %  10/03/15 1418 112/63 mmHg 98.5 F (36.9 C) Oral 75 16 94 %    1. General:  in No Acute distress 2. Psychological: Alert and   Oriented  3. Head/ENT:   Moist   Mucous Membranes                          Head Non traumatic, neck supple                          Normal  Dentition 4. SKIN:  decreased Skin turgor,  Skin clean Dry and intact no rash 5. Heart: Regular rate and rhythm no Murmur, Rub or gallop 6. Lungs: no wheezes some  crackles   7. Abdomen: Soft, non-tender, Non distended 8. Lower extremities: no clubbing, cyanosis, or edema 9. Neurologically Grossly intact, moving all 4 extremities equally 10. MSK: Normal range of motion  body mass index is unknown because there is no weight on file.   Labs on Admission:   Results for orders placed or performed during the hospital encounter of 10/03/15 (from the past 24 hour(s))  Basic metabolic panel     Status: Abnormal   Collection Time: 10/03/15  2:41 PM  Result Value Ref Range   Sodium 137 135 - 145 mmol/L   Potassium 3.8 3.5 - 5.1 mmol/L   Chloride 103 101 - 111 mmol/L   CO2 24 22 - 32 mmol/L   Glucose, Bld 139 (H) 65 - 99 mg/dL   BUN 31 (H) 6 - 20 mg/dL   Creatinine, Ser 1.79 (H) 0.61 - 1.24 mg/dL   Calcium 8.7 (L) 8.9 - 10.3 mg/dL   GFR calc non Af Amer 34 (L) >60 mL/min   GFR calc Af Amer 39 (L) >60 mL/min   Anion gap 10 5 - 15  CBC     Status: Abnormal   Collection Time: 10/03/15  2:41 PM  Result Value  Ref Range   WBC 26.6 (H) 4.0 - 10.5 K/uL   RBC 3.78 (L) 4.22 - 5.81 MIL/uL   Hemoglobin 11.5 (L) 13.0 - 17.0 g/dL   HCT 36.5 (L) 39.0 - 52.0 %   MCV 96.6 78.0 - 100.0 fL   MCH 30.4 26.0 - 34.0 pg   MCHC 31.5 30.0 - 36.0 g/dL   RDW 16.0 (H) 11.5 - 15.5 %   Platelets 179 150 - 400 K/uL  Brain natriuretic peptide     Status: Abnormal   Collection Time: 10/03/15  2:41 PM  Result  Value Ref Range   B Natriuretic Peptide 351.8 (H) 0.0 - 100.0 pg/mL  I-stat troponin, ED (not at [facility], [facility])     Status: None   Collection Time: 10/03/15  3:15 PM  Result Value Ref Range   Troponin i, poc 0.01 0.00 - 0.08 ng/mL   Comment 3            UA odered  No results found for: HGBA1C  CrCl cannot be calculated (Unknown ideal weight.).  BNP (last 3 results) No results for input(s): PROBNP in the last 8760 hours.  Other results:  I have pearsonaly reviewed this: ECG REPORT  Rate:73  Rhythm:NSR w PVC  ST&T Change: no ischemic changes QTC 506  There were no vitals filed for this visit.   Cultures: No results found for: SDES, [place], CULT, REPTSTATUS   Radiological Exams on Admission: Dg Chest 2 View  10/03/2015  CLINICAL DATA:  Chest tightness for 4 days. History of prior bypass surgery. EXAM: CHEST  2 VIEW COMPARISON:  PA and lateral chest 08/31/2013. FINDINGS: The patient is status post CABG. The lungs are clear. Heart size is normal. No pneumothorax or pleural effusion. No focal bony abnormality. IMPRESSION: No acute disease. Electronically Signed   By: Inge Rise M.D.   On: 10/03/2015 15:20   Ct Angio Chest Pe W/cm &/or Wo Cm  10/03/2015  CLINICAL DATA:  Pleuritic chest pain. Cold like symptoms. Leukocytosis. EXAM: CT ANGIOGRAPHY CHEST WITH CONTRAST TECHNIQUE: Multidetector CT imaging of the chest was performed using the standard protocol during bolus administration of intravenous contrast. Multiplanar CT image reconstructions and MIPs were obtained to evaluate the vascular anatomy. CONTRAST:  70mL OMNIPAQUE IOHEXOL 350 MG/ML SOLN COMPARISON:  None. FINDINGS: THORACIC INLET/BODY WALL: Bilateral axillary lymphadenopathy with globular homogeneous appearance. Index node in the superficial right axilla measures 31 x 16 mm on series 4, image 38. MEDIASTINUM: Cardiomegaly without pericardial effusion. Diffuse atherosclerosis, status post CABG. Due to motion artifact,  detection of enhancement within saphenous vein grafts is limited. Limited opacification of the aorta and branches with no evidence of dissection or other acute systemic arterial finding. No evidence of pulmonary embolism. Small sliding-type hiatal hernia. Hilar and mediastinal lymphadenopathy with similar appearance to the bulky axillary nodes. LUNG WINDOWS: Bibasilar bandlike opacities with volume loss. There is also airspace disease and bronchial wall thickening with multifocal mucoid impaction. No pulmonary edema or pleural effusion. UPPER ABDOMEN: Upper abdominal lymphadenopathy as recently evaluated by CT. OSSEOUS: No acute fracture.  No suspicious lytic or blastic lesions. Diffuse thoracic spondylosis. Notable T3 superior endplate Schmorl's node which appears chronic. Review of the MIP images confirms the above findings. IMPRESSION: 1. Bilateral lower lobe bronchopneumonia and atelectasis. 2. Lymphomatous appearing bulky intrathoracic and axillary lymphadenopathy. 3. No evidence of pulmonary embolism. Electronically Signed   By: Monte Fantasia M.D.   On: 10/03/2015 21:11    Chart has been reviewed  Family  at  Bedside  plan of care was discussed with   Wife Huston Stonehocker [phone]  Assessment/Plan 81 year old gentleman with history of hypertension, coronary disease, chronic kidney disease, and CLL presents with cough and shortness of breath was found to have bibasilar bronchopneumonia on CT scan  Present on Admission:  . Essential hypertension stable continue home medications  . Chronic kidney disease (CKD), stage III (moderate) currently stable continue to monitor creatinine  . CAP (community acquired pneumonia) - - will admit for treatment of CAP will start on appropriate antibiotic coverage.   Obtain sputum cultures, blood cultures if febrile or if decompensates.  Provide oxygen as needed.  . Chronic diastolic CHF (congestive heart failure), NYHA class 1 (HCC) - stable currently does  not appear to be fluid overloaded  Prophylaxis:   Lovenox   CODE STATUS:  FULL CODE  as per patient    Disposition:  To home once workup is complete and patient is stable  Other plan as per orders.  I have spent a total of 55 min on this admission  Arcadio Cope 10/03/2015, 10:39 PM  [facility]  Pager [phone]   after 2 AM please page floor coverage PA If 7AM-7PM, please contact the day team taking care of the patient  [email or web address]  Password [email or web address]

## 2015-10-03 NOTE — ED Notes (Signed)
Patient undressed, in gown, on monitor, continuous pulse oximetry and blood pressure cuff 

## 2015-10-03 NOTE — Progress Notes (Signed)
Went to try to give Xopenex med not available. Rx notified

## 2015-10-04 DIAGNOSIS — D696 Thrombocytopenia, unspecified: Secondary | ICD-10-CM

## 2015-10-04 LAB — URINALYSIS, ROUTINE W REFLEX MICROSCOPIC
Bilirubin Urine: NEGATIVE
GLUCOSE, UA: 500 mg/dL — AB
HGB URINE DIPSTICK: NEGATIVE
KETONES UR: 15 mg/dL — AB
LEUKOCYTES UA: NEGATIVE
Nitrite: NEGATIVE
PROTEIN: NEGATIVE mg/dL
Specific Gravity, Urine: 1.02 (ref 1.005–1.030)
UROBILINOGEN UA: 1 mg/dL (ref 0.0–1.0)
pH: 5 (ref 5.0–8.0)

## 2015-10-04 LAB — STREP PNEUMONIAE URINARY ANTIGEN: STREP PNEUMO URINARY ANTIGEN: NEGATIVE

## 2015-10-04 LAB — CBC
HCT: 32.6 % — ABNORMAL LOW (ref 39.0–52.0)
HEMOGLOBIN: 10.5 g/dL — AB (ref 13.0–17.0)
MCH: 30.6 pg (ref 26.0–34.0)
MCHC: 32.2 g/dL (ref 30.0–36.0)
MCV: 95 fL (ref 78.0–100.0)
PLATELETS: 146 10*3/uL — AB (ref 150–400)
RBC: 3.43 MIL/uL — AB (ref 4.22–5.81)
RDW: 16.1 % — ABNORMAL HIGH (ref 11.5–15.5)
WBC: 12.3 10*3/uL — ABNORMAL HIGH (ref 4.0–10.5)

## 2015-10-04 LAB — GLUCOSE, CAPILLARY: GLUCOSE-CAPILLARY: 112 mg/dL — AB (ref 65–99)

## 2015-10-04 LAB — COMPREHENSIVE METABOLIC PANEL
ALBUMIN: 3 g/dL — AB (ref 3.5–5.0)
ALK PHOS: 49 U/L (ref 38–126)
ALT: 12 U/L — ABNORMAL LOW (ref 17–63)
ANION GAP: 11 (ref 5–15)
AST: 15 U/L (ref 15–41)
BILIRUBIN TOTAL: 0.6 mg/dL (ref 0.3–1.2)
BUN: 25 mg/dL — ABNORMAL HIGH (ref 6–20)
CALCIUM: 8.2 mg/dL — AB (ref 8.9–10.3)
CO2: 20 mmol/L — AB (ref 22–32)
CREATININE: 1.68 mg/dL — AB (ref 0.61–1.24)
Chloride: 107 mmol/L (ref 101–111)
GFR calc non Af Amer: 36 mL/min — ABNORMAL LOW (ref >60)
GFR, EST AFRICAN AMERICAN: 42 mL/min — AB (ref >60)
Glucose, Bld: 227 mg/dL — ABNORMAL HIGH (ref 65–99)
Potassium: 3.8 mmol/L (ref 3.5–5.1)
SODIUM: 138 mmol/L (ref 135–145)
TOTAL PROTEIN: 5.2 g/dL — AB (ref 6.5–8.1)

## 2015-10-04 LAB — MAGNESIUM: MAGNESIUM: 2 mg/dL (ref 1.7–2.4)

## 2015-10-04 LAB — TROPONIN I: Troponin I: <0.03 ng/mL (ref <0.031)

## 2015-10-04 MED ORDER — CLOPIDOGREL BISULFATE 75 MG PO TABS
75.0000 mg | ORAL_TABLET | Freq: Every day | ORAL | Status: DC
  Administered 2015-10-04 – 2015-10-05 (×2): 75 mg via ORAL
  Filled 2015-10-04 (×2): qty 1

## 2015-10-04 MED ORDER — ASPIRIN EC 81 MG PO TBEC
81.0000 mg | DELAYED_RELEASE_TABLET | Freq: Every day | ORAL | Status: DC
  Administered 2015-10-04: 81 mg via ORAL
  Filled 2015-10-04: qty 1

## 2015-10-04 MED ORDER — ENOXAPARIN SODIUM 30 MG/0.3ML ~~LOC~~ SOLN
30.0000 mg | SUBCUTANEOUS | Status: DC
  Administered 2015-10-04: 30 mg via SUBCUTANEOUS
  Filled 2015-10-04: qty 0.3

## 2015-10-04 MED ORDER — ENALAPRIL MALEATE 10 MG PO TABS
10.0000 mg | ORAL_TABLET | Freq: Every day | ORAL | Status: DC
  Administered 2015-10-04 – 2015-10-05 (×2): 10 mg via ORAL
  Filled 2015-10-04 (×2): qty 1

## 2015-10-04 MED ORDER — SODIUM CHLORIDE 0.9 % IV SOLN
INTRAVENOUS | Status: DC
  Administered 2015-10-04: 75 mL/h via INTRAVENOUS
  Administered 2015-10-04: 08:00:00 via INTRAVENOUS

## 2015-10-04 MED ORDER — AMLODIPINE BESYLATE 5 MG PO TABS
5.0000 mg | ORAL_TABLET | Freq: Every day | ORAL | Status: DC
  Administered 2015-10-04 – 2015-10-05 (×2): 5 mg via ORAL
  Filled 2015-10-04 (×2): qty 1

## 2015-10-04 MED ORDER — TAMSULOSIN HCL 0.4 MG PO CAPS: 0.4000 mg | ORAL_CAPSULE | Freq: Every day | ORAL | Status: DC | PRN

## 2015-10-04 MED ORDER — ASPIRIN EC 81 MG PO TBEC
81.0000 mg | DELAYED_RELEASE_TABLET | Freq: Every day | ORAL | Status: DC
  Filled 2015-10-04: qty 1

## 2015-10-04 MED ORDER — DEXTROSE 5 % IV SOLN
1.0000 g | INTRAVENOUS | Status: DC
  Administered 2015-10-04: 1 g via INTRAVENOUS
  Filled 2015-10-04 (×2): qty 10

## 2015-10-04 NOTE — Progress Notes (Signed)
Pt. Arrived to the floor via stretcher from ED in stable condition. Pt. Alert and oriented, VSS, no s/s of distress or discomfort noted. Pt. Denies pain at this time. Pt. Placed on telemetry monitor. CCMD notified. Pt. Oriented to room, call light placed within reach. Pt. Now requesting food and water, stating he hasn't had anything since the am. Text page sent to On call NP for [facility] for diet order. Milt Coye, Katherine Roan

## 2015-10-04 NOTE — Progress Notes (Signed)
[facility] Progress Note   [name] [name]  OVF:[id number]  DOB: September 01, 1931  DOA: 10/03/2015 PCP: Olin Hauser, MD  Brief narrative: [name] [name] is a 81 y.o. male with PMH of TIA, CKD 3, CAD s/p CABG, DLL who presents to the ER for chest tightness, cough and dyspnea on exertion for past 3-4 days. He felt that he may be having cardiac trouble. He is found to have b/l pneumonia on imaging. He admits that he has has symptoms of a cold for about a wk now which are getting worse.    Subjective: Chest tightness has resolved. Continues to have a cough. No dyspnea at rest.   Assessment/Plan: Principal Problem:   CAP (community acquired pneumonia)- leukocytosis - continue to treat with Rocephin and Zithromax - cough improving- ambulate and follow for improvement in dyspnea - not requiring O2 at rest  Active Problems: Chest tightness - likely due to above pneumonia and not ACS- EKG unrevealing Troponin negative   Prolonged QTc - follow- repeat EKG tomorrow - check magnesium - K and Ca+ are normal    Essential hypertension - cont Amlodipine & Enalapril    Coronary atherosclerosis - s/p CABG and stent - cont ASA 81 mg daily and Plavix    Chronic kidney disease (CKD), stage III (moderate) - stable    Chronic diastolic CHF (congestive heart failure), NYHA class 1 (HCC) - compensated- d/c IVF    Thrombocytopenia ([place]) - in setting of acute infection- follow- cont Lovenox for now  BPH -cont Flomax    Code Status:     Code Status Orders        Start     Ordered   10/04/15 0138  Full code   Continuous     10/04/15 0137     Family Communication: wife at bedside Disposition Plan: f/u on improvement of pneumonia- home tomorrow?  DVT prophylaxis: Lovenox Consultants:none Procedures:   Antibiotics: Anti-infectives    Start     Dose/Rate Route Frequency Ordered Stop   10/04/15 2200  cefTRIAXone (ROCEPHIN) 1 g in dextrose 5 % 50 mL IVPB     1 g 100  mL/hr over 30 Minutes Intravenous Every 24 hours 10/04/15 0137     10/03/15 2230  cefTRIAXone (ROCEPHIN) 1 g in dextrose 5 % 50 mL IVPB     1 g 100 mL/hr over 30 Minutes Intravenous  Once 10/03/15 2220 10/04/15 0036   10/03/15 2230  azithromycin (ZITHROMAX) 500 mg in dextrose 5 % 250 mL IVPB     500 mg 250 mL/hr over 60 Minutes Intravenous Every 24 hours 10/03/15 2220     10/03/15 2200  levofloxacin (LEVAQUIN) IVPB 750 mg  Status:  Discontinued     750 mg 100 mL/hr over 90 Minutes Intravenous  Once 10/03/15 2155 10/03/15 2219      Objective: Filed Weights   10/04/15 0053  Weight: 82.146 kg (181 lb 1.6 oz)    Intake/Output Summary (Last 24 hours) at 10/04/15 1027 Last data filed at 10/04/15 0900  Gross per 24 hour  Intake 2933.75 ml  Output   1925 ml  Net 1008.75 ml     Vitals Filed Vitals:   10/04/15 0053 10/04/15 0608 10/04/15 0826 10/04/15 0845  BP: 138/50 121/61 112/48   Pulse: 79 75 84   Temp: 98.2 F (36.8 C) 97.3 F (36.3 C) 97.7 F (36.5 C)   TempSrc: Oral Oral Oral   Resp: 20 20 20    Height: 5\' 8"  (1.727 m)  Weight: 82.146 kg (181 lb 1.6 oz)     SpO2: 96% 98% 97% 97%    Exam:  General:  Pt is alert, not in acute distress  HEENT: No icterus, No thrush, oral mucosa moist  Cardiovascular: regular rate and rhythm, S1/S2 No murmur  Respiratory: crackles at b/l bases  Abdomen: Soft, +Bowel sounds, non tender, non distended, no guarding  MSK: No LE edema, cyanosis or clubbing  Data Reviewed: Basic Metabolic Panel:  Recent Labs Lab 10/03/15 1441 10/04/15 0252  NA 137 138  K 3.8 3.8  CL 103 107  CO2 24 20*  GLUCOSE 139* 227*  BUN 31* 25*  CREATININE 1.79* 1.68*  CALCIUM 8.7* 8.2*   Liver Function Tests:  Recent Labs Lab 10/04/15 0252  AST 15  ALT 12*  ALKPHOS 49  BILITOT 0.6  PROT 5.2*  ALBUMIN 3.0*   No results for input(s): LIPASE, AMYLASE in the last 168 hours. No results for input(s): AMMONIA in the last 168  hours. CBC:  Recent Labs Lab 10/03/15 1441 10/04/15 0252  WBC 26.6* 12.3*  HGB 11.5* 10.5*  HCT 36.5* 32.6*  MCV 96.6 95.0  PLT 179 146*   Cardiac Enzymes: No results for input(s): CKTOTAL, CKMB, CKMBINDEX, TROPONINI in the last 168 hours. BNP (last 3 results)  Recent Labs  10/03/15 1441  BNP 351.8*    ProBNP (last 3 results) No results for input(s): PROBNP in the last 8760 hours.  CBG:  Recent Labs Lab 10/04/15 0609  GLUCAP 112*    No results found for this or any previous visit (from the past 240 hour(s)).   Studies: Dg Chest 2 View  10/03/2015  CLINICAL DATA:  Chest tightness for 4 days. History of prior bypass surgery. EXAM: CHEST  2 VIEW COMPARISON:  PA and lateral chest 08/31/2013. FINDINGS: The patient is status post CABG. The lungs are clear. Heart size is normal. No pneumothorax or pleural effusion. No focal bony abnormality. IMPRESSION: No acute disease. Electronically Signed   By: Inge Rise M.D.   On: 10/03/2015 15:20   Ct Angio Chest Pe W/cm &/or Wo Cm  10/03/2015  CLINICAL DATA:  Pleuritic chest pain. Cold like symptoms. Leukocytosis. EXAM: CT ANGIOGRAPHY CHEST WITH CONTRAST TECHNIQUE: Multidetector CT imaging of the chest was performed using the standard protocol during bolus administration of intravenous contrast. Multiplanar CT image reconstructions and MIPs were obtained to evaluate the vascular anatomy. CONTRAST:  33mL OMNIPAQUE IOHEXOL 350 MG/ML SOLN COMPARISON:  None. FINDINGS: THORACIC INLET/BODY WALL: Bilateral axillary lymphadenopathy with globular homogeneous appearance. Index node in the superficial right axilla measures 31 x 16 mm on series 4, image 38. MEDIASTINUM: Cardiomegaly without pericardial effusion. Diffuse atherosclerosis, status post CABG. Due to motion artifact, detection of enhancement within saphenous vein grafts is limited. Limited opacification of the aorta and branches with no evidence of dissection or other acute systemic  arterial finding. No evidence of pulmonary embolism. Small sliding-type hiatal hernia. Hilar and mediastinal lymphadenopathy with similar appearance to the bulky axillary nodes. LUNG WINDOWS: Bibasilar bandlike opacities with volume loss. There is also airspace disease and bronchial wall thickening with multifocal mucoid impaction. No pulmonary edema or pleural effusion. UPPER ABDOMEN: Upper abdominal lymphadenopathy as recently evaluated by CT. OSSEOUS: No acute fracture.  No suspicious lytic or blastic lesions. Diffuse thoracic spondylosis. Notable T3 superior endplate Schmorl's node which appears chronic. Review of the MIP images confirms the above findings. IMPRESSION: 1. Bilateral lower lobe bronchopneumonia and atelectasis. 2. Lymphomatous appearing bulky intrathoracic and axillary  lymphadenopathy. 3. No evidence of pulmonary embolism. Electronically Signed   By: Monte Fantasia M.D.   On: 10/03/2015 21:11    Scheduled Meds:  Scheduled Meds: . amLODipine  5 mg Oral Daily  . aspirin EC  81 mg Oral Daily  . azithromycin  500 mg Intravenous Q24H  . cefTRIAXone (ROCEPHIN) IVPB 1 gram/50 mL D5W  1 g Intravenous Q24H  . clopidogrel  75 mg Oral Daily  . enalapril  10 mg Oral Daily  . enoxaparin (LOVENOX) injection  30 mg Subcutaneous Q24H  . levalbuterol  0.63 mg Nebulization TID   Continuous Infusions: . sodium chloride 75 mL/hr at 10/04/15 0750    Time spent on care of this patient: 35 min   [place], MD 10/04/2015, 10:27 AM  LOS: 1 day   [facility] Office  [phone] Pager - Text Page per [email or web address] If 7PM-7AM, please contact night-coverage [email or web address]

## 2015-10-05 ENCOUNTER — Other Ambulatory Visit: Payer: Self-pay | Admitting: Hematology & Oncology

## 2015-10-05 DIAGNOSIS — J189 Pneumonia, unspecified organism: Secondary | ICD-10-CM

## 2015-10-05 DIAGNOSIS — C9110 Chronic lymphocytic leukemia of B-cell type not having achieved remission: Secondary | ICD-10-CM

## 2015-10-05 DIAGNOSIS — J181 Lobar pneumonia, unspecified organism: Principal | ICD-10-CM

## 2015-10-05 DIAGNOSIS — J180 Bronchopneumonia, unspecified organism: Principal | ICD-10-CM

## 2015-10-05 LAB — CBC WITH DIFFERENTIAL/PLATELET
BAND NEUTROPHILS: 2 %
BASOS PCT: 0 %
Basophils Absolute: 0 10*3/uL (ref 0.0–0.1)
Blasts: 0 %
EOS PCT: 1 %
Eosinophils Absolute: 0.3 10*3/uL (ref 0.0–0.7)
HCT: 32.6 % — ABNORMAL LOW (ref 39.0–52.0)
HEMOGLOBIN: 10.4 g/dL — AB (ref 13.0–17.0)
LYMPHS ABS: 21.5 10*3/uL — AB (ref 0.7–4.0)
Lymphocytes Relative: 82 %
MCH: 30.9 pg (ref 26.0–34.0)
MCHC: 31.9 g/dL (ref 30.0–36.0)
MCV: 96.7 fL (ref 78.0–100.0)
MONO ABS: 1.3 10*3/uL — AB (ref 0.1–1.0)
MYELOCYTES: 0 %
Metamyelocytes Relative: 0 %
Monocytes Relative: 5 %
Neutro Abs: 3.2 10*3/uL (ref 1.7–7.7)
Neutrophils Relative %: 10 %
Other: 0 %
PLATELETS: 164 10*3/uL (ref 150–400)
PROMYELOCYTES ABS: 0 %
RBC: 3.37 MIL/uL — AB (ref 4.22–5.81)
RDW: 16.3 % — ABNORMAL HIGH (ref 11.5–15.5)
WBC: 26.3 10*3/uL — ABNORMAL HIGH (ref 4.0–10.5)
nRBC: 0 /100 WBC

## 2015-10-05 LAB — URINE CULTURE: Culture: 3000

## 2015-10-05 LAB — LEGIONELLA PNEUMOPHILA SEROGP 1 UR AG: L. PNEUMOPHILA SEROGP 1 UR AG: NEGATIVE

## 2015-10-05 MED ORDER — AZITHROMYCIN 500 MG PO TABS: 500.0000 mg | ORAL_TABLET | Freq: Every day | ORAL | Status: DC

## 2015-10-05 MED ORDER — CEFDINIR 300 MG PO CAPS: 300.0000 mg | ORAL_CAPSULE | Freq: Two times a day (BID) | ORAL | Status: DC

## 2015-10-05 NOTE — Progress Notes (Signed)
Home discharge instructions discussed with patient. Discussed diet, activity, follow up appt and medications. Prescriptions for Omnicef and Azithromycin given to patient. Patient instructed on proper use. Patient verbally understands instructions by using teachback.

## 2015-10-05 NOTE — Consult Note (Signed)
Referral MD  Reason for Referral: CLL and pneumonia  Chief Complaint  Patient presents with  . Cough  . Chest Pain  : I had pneumonia.  HPI: [name] [name] is well-known to me. He is an 81 year old white male. I haven't seen him because of CLL. He has been asymptomatic with this. He has had abdominal and pelvic lymphadenopathy.  He has had a history of pneumonia in the past. His wife says that he gets pneumonia pretty easily.  He began to feel sick after getting a flu shot. He had a cough. It is non-productive. He was not having any kind temperature is.  He ultimately was admitted. He had a CT of the chest. He actually had a CT angiogram done. This did not show any pulmonary embolism. He did have axillary, hilar and mediastinal adenopathy. He had bilateral bronchopneumonia.  His labs when first came in showed a white cell count of  26,000. Hemoglobin 11.5 platelet count 179.  Yesterday, white cell count was 12.3. Hemoglobin 10.5 and plate count [phone].  He has had no bleeding. He's had no rashes. He's had no diarrhea. He's had no nausea or vomiting.  We're asked to see him to see that he needed to be done about the CLL.    Past Medical History  Diagnosis Date  . HYPERLIPIDEMIA   . GOUT   . HYPERTENSION   . CAD     a. s/p CABG x 4 1988;  b. s/p redo CABG x 4 1996;  c. 04/2006 Cath/PCI: LM nl, LAD 100p, LCX 100, RCA 100, LIMA->LAD 40 anast, VG->LCX 95p (3.5x24 [employer] BMS), RIMA->RCA ok;  d. 12/2012 MV: smal area of ischemia in basal inferiolateral wall.  . TRANSIENT ISCHEMIC ATTACK   . NEPHROLITHIASIS   . CKD (chronic kidney disease), stage III   . CONTRAST DYE ALLERGY, HX OF   . HOH (hard of hearing)     a. uses hearing aids  . Cancer [facility]) 2013    bladder cancer, skin cancer  . Asthma   :  Past Surgical History  Procedure Laterality Date  . Coronary artery bypass graft  1986  . Carotid endarterectomy    . Coronary artery bypass graft  1996  . Total knee arthroplasty   10/2009    rt  . Mass excision  05/27/2012    Procedure: EXCISION MASS;  Surgeon: Theodoro Kos, DO;  Location: [facility];  Service: Plastics;  Laterality: N/A;  repair of upper lip defect pt is post op  excision of skin cancer with tissue rearrangement or lip switch.  . Left heart catheterization with coronary/graft angiogram N/A 09/01/2013    Procedure: LEFT HEART CATHETERIZATION WITH Beatrix Fetters;  Surgeon: Peter M Martinique, MD;  Location: [facility] CATH LAB;  Service: Cardiovascular;  Laterality: N/A;  :   Current facility-administered medications:  .  amLODipine (NORVASC) tablet 5 mg, 5 mg, Oral, Daily, Toy Baker, MD, 5 mg at 10/04/15 0939 .  aspirin EC tablet 81 mg, 81 mg, Oral, Daily, Debbe Odea, MD, 81 mg at 10/04/15 2130 .  azithromycin (ZITHROMAX) 500 mg in dextrose 5 % 250 mL IVPB, 500 mg, Intravenous, Q24H, Voncille Lo, MD, Last Rate: 250 mL/hr at 10/04/15 2132, 500 mg at 10/04/15 2132 .  cefTRIAXone (ROCEPHIN) 1 g in dextrose 5 % 50 mL IVPB, 1 g, Intravenous, Q24H, Toy Baker, MD, 1 g at 10/04/15 2327 .  clopidogrel (PLAVIX) tablet 75 mg, 75 mg, Oral, Daily, Toy Baker, MD, 75 mg at  10/04/15 0939 .  enalapril (VASOTEC) tablet 10 mg, 10 mg, Oral, Daily, Toy Baker, MD, 10 mg at 10/04/15 0939 .  enoxaparin (LOVENOX) injection 30 mg, 30 mg, Subcutaneous, Q24H, Toy Baker, MD, 30 mg at 10/04/15 0939 .  tamsulosin (FLOMAX) capsule 0.4 mg, 0.4 mg, Oral, Daily PRN, Toy Baker, MD:  . amLODipine  5 mg Oral Daily  . aspirin EC  81 mg Oral Daily  . azithromycin  500 mg Intravenous Q24H  . cefTRIAXone (ROCEPHIN) IVPB 1 gram/50 mL D5W  1 g Intravenous Q24H  . clopidogrel  75 mg Oral Daily  . enalapril  10 mg Oral Daily  . enoxaparin (LOVENOX) injection  30 mg Subcutaneous Q24H  :  Allergies  Allergen Reactions  . Uloric [Febuxostat] Other (See Comments)    Pt states "it locked me up, I couldn't move from the waist  up" Upper body paralysis-temporary  . Ivp Dye [Iodinated Diagnostic Agents] Other (See Comments)    Patient continued sneezing   . Statins Other (See Comments)    Patient unable to walk  :  Family History  Problem Relation Age of Onset  . Heart attack Father   . Heart disease Sister   . Stroke Brother   :  Social History   Social History  . Marital Status: Married    Spouse Name: N/A  . Number of Children: N/A  . Years of Education: N/A   Occupational History  . Not on file.   Social History Main Topics  . Smoking status: Former Smoker -- 1.00 packs/day for 13 years    Types: Cigarettes    Start date: 02/06/1953    Quit date: 11/26/1965  . Smokeless tobacco: Never Used     Comment: quit smoking 46 years ago  . Alcohol Use: No  . Drug Use: No  . Sexual Activity: Not on file   Other Topics Concern  . Not on file   Social History Narrative  :  Pertinent items are noted in HPI.  Exam: Patient Vitals for the past 24 hrs:  BP Temp Temp src Pulse Resp SpO2 Weight  10/05/15 0300 112/60 mmHg 98.2 F (36.8 C) Oral 71 18 97 % 183 lb 1.6 oz (83.054 kg)  10/05/15 0011 115/63 mmHg 97.5 F (36.4 C) Oral 81 18 92 % -  10/04/15 1900 (!) 130/51 mmHg 97.9 F (36.6 C) Oral 83 18 97 % -  10/04/15 1820 (!) 126/45 mmHg 97.5 F (36.4 C) Oral 88 18 96 % -  10/04/15 1344 (!) 124/50 mmHg 97.9 F (36.6 C) Oral 67 18 97 % -  10/04/15 0845 - - - - - 97 % -  10/04/15 0826 (!) 112/48 mmHg 97.7 F (36.5 C) Oral 84 20 97 % -   well-developed and well-nourished elderly gentleman. Head and neck exam shows some small right cervical lymph nodes. They measure less than 1 cm. He has no oral lesions. There is no scleral icterus. Lungs are clear. He has no rales, wheezes or rhonchi. Cardiac exam regular rate and rhythm with no murmurs, rubs or bruits. Abdomen is soft. He has no fluid wave. There is no palpable liver or spleen tip. Extremities shows no clubbing, cyanosis or edema.   Recent  Labs  10/03/15 1441 10/04/15 0252  WBC 26.6* 12.3*  HGB 11.5* 10.5*  HCT 36.5* 32.6*  PLT 179 146*    Recent Labs  10/03/15 1441 10/04/15 0252  NA 137 138  K 3.8 3.8  CL 103 107  CO2 24 20*  GLUCOSE 139* 227*  BUN 31* 25*  CREATININE 1.79* 1.68*  CALCIUM 8.7* 8.2*    Blood smear review: none   Pathology: None     Assessment and Plan:  Mr. Christean Leaf is an 81 year old gentleman. He has history of CLL versus low-grade non-Hodgkin's lymphoma. We've not yet got a biopsy of the lymph nodes as he really has not needed this.  It'll be very interesting to see what his immunoglobulin levels are.  I think if his IgG level is low, then we may have to embark upon therapy for the underlying lymphoproliferative process. We may need to get a biopsy of the lymph node.  I noted that he was a little more anemic. He does not have any lab work back yet today. It'll be interesting see what his CBC is today.  He's not sure when he is going to be discharged. I'm sure that he probably will go home soon. From my point of view, he looks pretty good.  We will follow him up once he is discharged. Upon I will see him back in about 3 weeks.  He already has CAT scans set up for January. We will keep these as scheduled unless we find something that we'll indicate that would have he started on therapy sooner.  I spent about 45 minutes with he and his wife. As always, it was fun talking to him.  Pete E.  Ephesians 6:9-10

## 2015-10-05 NOTE — Progress Notes (Signed)
An EKG was completed and placed in the patient's chart.

## 2015-10-05 NOTE — Progress Notes (Signed)
Patient taken out of hospital for discharge via wheelchair.  

## 2015-10-05 NOTE — Discharge Summary (Addendum)
Physician Discharge Summary  [name] [name] MRN: [id number] DOB/AGE: 1931-07-29 81 y.o.  PCP: Olin Hauser, MD   Admit date: 10/03/2015 Discharge date: 10/05/2015  Discharge Diagnoses:     Principal Problem:   CAP (community acquired pneumonia) Active Problems:   Essential hypertension   Coronary atherosclerosis   Chronic kidney disease (CKD), stage III (moderate)   Chronic diastolic CHF (congestive heart failure), NYHA class 1 (HCC)   Thrombocytopenia (HCC)    Follow-up recommendations Follow-up with PCP in 3-5 days , including all  additional recommended appointments as below follow-up CBC, CMP in 3-5 days Follow-up with [name],  , in 3 weeks       Medication List    STOP taking these medications        hydrochlorothiazide 25 MG tablet  Commonly known as:  HYDRODIURIL     predniSONE 5 MG tablet  Commonly known as:  DELTASONE      TAKE these medications        allopurinol 100 MG tablet  Commonly known as:  ZYLOPRIM  Take 100 mg by mouth daily as needed (for flareup).     amLODipine 5 MG tablet  Commonly known as:  NORVASC  Take 1 tablet (5 mg total) by mouth daily.     aspirin EC 81 MG tablet  Take 81 mg by mouth daily.     azithromycin 500 MG tablet  Commonly known as:  ZITHROMAX  Take 1 tablet (500 mg total) by mouth daily.     calcium carbonate 500 MG chewable tablet  Commonly known as:  TUMS - dosed in mg elemental calcium  Chew 1 tablet by mouth at bedtime.     cefdinir 300 MG capsule  Commonly known as:  OMNICEF  Take 1 capsule (300 mg total) by mouth 2 (two) times daily.     clopidogrel 75 MG tablet  Commonly known as:  PLAVIX  TAKE ONE TABLET DAILY WITH BREAKFAST     diphenhydrAMINE 25 MG tablet  Commonly known as:  BENADRYL  Take 25 mg by mouth at bedtime.     enalapril 10 MG tablet  Commonly known as:  VASOTEC  Take 1 tablet (10 mg total) by mouth daily.     Fish Oil 1200 MG Caps  Take 1,200 mg by mouth 2 (two)  times daily.     metoprolol 50 MG tablet  Commonly known as:  LOPRESSOR  Take 0.5 tablets (25 mg total) by mouth 2 (two) times daily.     nitroGLYCERIN 0.4 MG SL tablet  Commonly known as:  NITROSTAT  Place 1 tablet (0.4 mg total) under the tongue every 5 (five) minutes as needed for chest pain.     Red Yeast Rice 600 MG Tabs  Take 1,200 mg by mouth at bedtime.     tamsulosin 0.4 MG Caps capsule  Commonly known as:  FLOMAX  Take 0.4 mg by mouth daily as needed (for urination).     triamcinolone ointment 0.1 %  Commonly known as:  KENALOG  Apply 1 application topically 2 (two) times daily as needed (dermatitis).         Discharge Condition: Stable   Discharge Instructions       Discharge Instructions    Diet - low sodium heart healthy    Complete by:  As directed      Increase activity slowly    Complete by:  As directed  Allergies  Allergen Reactions  . Uloric [Febuxostat] Other (See Comments)    Pt states "it locked me up, I couldn't move from the waist up" Upper body paralysis-temporary  . Ivp Dye [Iodinated Diagnostic Agents] Other (See Comments)    Patient continued sneezing   . Statins Other (See Comments)    Patient unable to walk      Disposition: 01-Home or Self Care   Consults:  Oncology   Significant Diagnostic Studies:  Dg Chest 2 View  10/03/2015  CLINICAL DATA:  Chest tightness for 4 days. History of prior bypass surgery. EXAM: CHEST  2 VIEW COMPARISON:  PA and lateral chest 08/31/2013. FINDINGS: The patient is status post CABG. The lungs are clear. Heart size is normal. No pneumothorax or pleural effusion. No focal bony abnormality. IMPRESSION: No acute disease. Electronically Signed   By: Inge Rise M.D.   On: 10/03/2015 15:20   Ct Angio Chest Pe W/cm &/or Wo Cm  10/03/2015  CLINICAL DATA:  Pleuritic chest pain. Cold like symptoms. Leukocytosis. EXAM: CT ANGIOGRAPHY CHEST WITH CONTRAST TECHNIQUE: Multidetector CT  imaging of the chest was performed using the standard protocol during bolus administration of intravenous contrast. Multiplanar CT image reconstructions and MIPs were obtained to evaluate the vascular anatomy. CONTRAST:  23m OMNIPAQUE IOHEXOL 350 MG/ML SOLN COMPARISON:  None. FINDINGS: THORACIC INLET/BODY WALL: Bilateral axillary lymphadenopathy with globular homogeneous appearance. Index node in the superficial right axilla measures 31 x 16 mm on series 4, image 38. MEDIASTINUM: Cardiomegaly without pericardial effusion. Diffuse atherosclerosis, status post CABG. Due to motion artifact, detection of enhancement within saphenous vein grafts is limited. Limited opacification of the aorta and branches with no evidence of dissection or other acute systemic arterial finding. No evidence of pulmonary embolism. Small sliding-type hiatal hernia. Hilar and mediastinal lymphadenopathy with similar appearance to the bulky axillary nodes. LUNG WINDOWS: Bibasilar bandlike opacities with volume loss. There is also airspace disease and bronchial wall thickening with multifocal mucoid impaction. No pulmonary edema or pleural effusion. UPPER ABDOMEN: Upper abdominal lymphadenopathy as recently evaluated by CT. OSSEOUS: No acute fracture.  No suspicious lytic or blastic lesions. Diffuse thoracic spondylosis. Notable T3 superior endplate Schmorl's node which appears chronic. Review of the MIP images confirms the above findings. IMPRESSION: 1. Bilateral lower lobe bronchopneumonia and atelectasis. 2. Lymphomatous appearing bulky intrathoracic and axillary lymphadenopathy. 3. No evidence of pulmonary embolism. Electronically Signed   By: JMonte FantasiaM.D.   On: 10/03/2015 21:11        Filed Weights   10/04/15 0053 10/05/15 0300  Weight: 82.146 kg (181 lb 1.6 oz) 83.054 kg (183 lb 1.6 oz)     Microbiology: Recent Results (from the past 240 hour(s))  Urine culture     Status: None   Collection Time: 10/04/15  2:26 AM   Result Value Ref Range Status   Specimen Description URINE, CLEAN CATCH  Final   Special Requests Immunocompromised  Final   Culture 3,000 COLONIES/mL INSIGNIFICANT GROWTH  Final   Report Status 10/05/2015 FINAL  Final       Blood Culture    Component Value Date/Time   SDES URINE, CLEAN CATCH 10/04/2015 0226   SPECREQUEST Immunocompromised 10/04/2015 0226   CULT 3,000 COLONIES/mL INSIGNIFICANT GROWTH 10/04/2015 0226   REPTSTATUS 10/05/2015 FINAL 10/04/2015 0226      Labs: Results for orders placed or performed during the hospital encounter of 10/03/15 (from the past 48 hour(s))  Basic metabolic panel     Status: Abnormal  Collection Time: 10/03/15  2:41 PM  Result Value Ref Range   Sodium 137 135 - 145 mmol/L   Potassium 3.8 3.5 - 5.1 mmol/L   Chloride 103 101 - 111 mmol/L   CO2 24 22 - 32 mmol/L   Glucose, Bld 139 (H) 65 - 99 mg/dL   BUN 31 (H) 6 - 20 mg/dL   Creatinine, Ser 1.79 (H) 0.61 - 1.24 mg/dL   Calcium 8.7 (L) 8.9 - 10.3 mg/dL   GFR calc non Af Amer 34 (L) >60 mL/min   GFR calc Af Amer 39 (L) >60 mL/min    Comment: (NOTE) The eGFR has been calculated using the CKD EPI equation. This calculation has not been validated in all clinical situations. eGFR's persistently <60 mL/min signify possible Chronic Kidney Disease.    Anion gap 10 5 - 15  CBC     Status: Abnormal   Collection Time: 10/03/15  2:41 PM  Result Value Ref Range   WBC 26.6 (H) 4.0 - 10.5 K/uL   RBC 3.78 (L) 4.22 - 5.81 MIL/uL   Hemoglobin 11.5 (L) 13.0 - 17.0 g/dL   HCT 36.5 (L) 39.0 - 52.0 %   MCV 96.6 78.0 - 100.0 fL   MCH 30.4 26.0 - 34.0 pg   MCHC 31.5 30.0 - 36.0 g/dL   RDW 16.0 (H) 11.5 - 15.5 %   Platelets 179 150 - 400 K/uL  Brain natriuretic peptide     Status: Abnormal   Collection Time: 10/03/15  2:41 PM  Result Value Ref Range   B Natriuretic Peptide 351.8 (H) 0.0 - 100.0 pg/mL  I-stat troponin, ED (not at [facility])     Status: None   Collection Time: 10/03/15  3:15 PM   Result Value Ref Range   Troponin i, poc 0.01 0.00 - 0.08 ng/mL   Comment 3            Comment: Due to the release kinetics of cTnI, a negative result within the first hours of the onset of symptoms does not rule out myocardial infarction with certainty. If myocardial infarction is still suspected, repeat the test at appropriate intervals.   Urine culture     Status: None   Collection Time: 10/04/15  2:26 AM  Result Value Ref Range   Specimen Description URINE, CLEAN CATCH    Special Requests Immunocompromised    Culture 3,000 COLONIES/mL INSIGNIFICANT GROWTH    Report Status 10/05/2015 FINAL   Urinalysis, Routine w reflex microscopic (not at [facility])     Status: Abnormal   Collection Time: 10/04/15  2:26 AM  Result Value Ref Range   Color, Urine YELLOW YELLOW   APPearance CLEAR CLEAR   Specific Gravity, Urine 1.020 1.005 - 1.030   pH 5.0 5.0 - 8.0   Glucose, UA 500 (A) NEGATIVE mg/dL   Hgb urine dipstick NEGATIVE NEGATIVE   Bilirubin Urine NEGATIVE NEGATIVE   Ketones, ur 15 (A) NEGATIVE mg/dL   Protein, ur NEGATIVE NEGATIVE mg/dL   Urobilinogen, UA 1.0 0.0 - 1.0 mg/dL   Nitrite NEGATIVE NEGATIVE   Leukocytes, UA NEGATIVE NEGATIVE    Comment: MICROSCOPIC NOT DONE ON URINES WITH NEGATIVE PROTEIN, BLOOD, LEUKOCYTES, NITRITE, OR GLUCOSE <1000 mg/dL.  Strep pneumoniae urinary antigen  (not at [facility])     Status: None   Collection Time: 10/04/15  2:26 AM  Result Value Ref Range   Strep Pneumo Urinary Antigen NEGATIVE NEGATIVE    Comment:        Infection  due to S. pneumoniae cannot be absolutely ruled out since the antigen present may be below the detection limit of the test.   Comprehensive metabolic panel     Status: Abnormal   Collection Time: 10/04/15  2:52 AM  Result Value Ref Range   Sodium 138 135 - 145 mmol/L   Potassium 3.8 3.5 - 5.1 mmol/L   Chloride 107 101 - 111 mmol/L   CO2 20 (L) 22 - 32 mmol/L   Glucose, Bld 227 (H) 65 - 99 mg/dL   BUN 25 (H) 6 - 20 mg/dL    Creatinine, Ser 1.68 (H) 0.61 - 1.24 mg/dL   Calcium 8.2 (L) 8.9 - 10.3 mg/dL   Total Protein 5.2 (L) 6.5 - 8.1 g/dL   Albumin 3.0 (L) 3.5 - 5.0 g/dL   AST 15 15 - 41 U/L   ALT 12 (L) 17 - 63 U/L   Alkaline Phosphatase 49 38 - 126 U/L   Total Bilirubin 0.6 0.3 - 1.2 mg/dL   GFR calc non Af Amer 36 (L) >60 mL/min   GFR calc Af Amer 42 (L) >60 mL/min    Comment: (NOTE) The eGFR has been calculated using the CKD EPI equation. This calculation has not been validated in all clinical situations. eGFR's persistently <60 mL/min signify possible Chronic Kidney Disease.    Anion gap 11 5 - 15  CBC     Status: Abnormal   Collection Time: 10/04/15  2:52 AM  Result Value Ref Range   WBC 12.3 (H) 4.0 - 10.5 K/uL   RBC 3.43 (L) 4.22 - 5.81 MIL/uL   Hemoglobin 10.5 (L) 13.0 - 17.0 g/dL   HCT 32.6 (L) 39.0 - 52.0 %   MCV 95.0 78.0 - 100.0 fL   MCH 30.6 26.0 - 34.0 pg   MCHC 32.2 30.0 - 36.0 g/dL   RDW 16.1 (H) 11.5 - 15.5 %   Platelets 146 (L) 150 - 400 K/uL  Glucose, capillary     Status: Abnormal   Collection Time: 10/04/15  6:09 AM  Result Value Ref Range   Glucose-Capillary 112 (H) 65 - 99 mg/dL  Troponin I     Status: None   Collection Time: 10/04/15  9:26 AM  Result Value Ref Range   Troponin I <0.03 <0.031 ng/mL    Comment:        NO INDICATION OF MYOCARDIAL INJURY.   Magnesium     Status: None   Collection Time: 10/04/15  9:26 AM  Result Value Ref Range   Magnesium 2.0 1.7 - 2.4 mg/dL  Troponin I     Status: None   Collection Time: 10/04/15  8:28 PM  Result Value Ref Range   Troponin I <0.03 <0.031 ng/mL    Comment:        NO INDICATION OF MYOCARDIAL INJURY.   CBC with Differential/Platelet     Status: Abnormal   Collection Time: 10/05/15  7:50 AM  Result Value Ref Range   WBC 26.3 (H) 4.0 - 10.5 K/uL   RBC 3.37 (L) 4.22 - 5.81 MIL/uL   Hemoglobin 10.4 (L) 13.0 - 17.0 g/dL   HCT 32.6 (L) 39.0 - 52.0 %   MCV 96.7 78.0 - 100.0 fL   MCH 30.9 26.0 - 34.0 pg   MCHC  31.9 30.0 - 36.0 g/dL   RDW 16.3 (H) 11.5 - 15.5 %   Platelets 164 150 - 400 K/uL   Neutrophils Relative % 10 %   Lymphocytes Relative 82 %  Monocytes Relative 5 %   Eosinophils Relative 1 %   Basophils Relative 0 %   Band Neutrophils 2 %   Metamyelocytes Relative 0 %   Myelocytes 0 %   Promyelocytes Absolute 0 %   Blasts 0 %   nRBC 0 0 /100 WBC   Other 0 %   Neutro Abs 3.2 1.7 - 7.7 K/uL   Lymphs Abs 21.5 (H) 0.7 - 4.0 K/uL   Monocytes Absolute 1.3 (H) 0.1 - 1.0 K/uL   Eosinophils Absolute 0.3 0.0 - 0.7 K/uL   Basophils Absolute 0.0 0.0 - 0.1 K/uL   WBC Morphology ABSOLUTE LYMPHOCYTOSIS     Comment: ATYPICAL LYMPHOCYTES     Lipid Panel     Component Value Date/Time   CHOL 155 12/12/2007 1046   TRIG 209* 12/12/2007 1046   HDL 26.9* 12/12/2007 1046   CHOLHDL 5.8 CALC 12/12/2007 1046   VLDL 42* 12/12/2007 1046   LDLDIRECT 72.2 12/12/2007 1046     No results found for: HGBA1C   Lab Results  Component Value Date   CREATININE 1.68* 10/04/2015     HPI :  81 y.o. male  has a past medical history of HYPERLIPIDEMIA; GOUT; HYPERTENSION; CAD; TRANSIENT ISCHEMIC ATTACK; NEPHROLITHIASIS; CKD (chronic kidney disease), stage III; CONTRAST DYE ALLERGY, HX OF; HOH (hard of hearing); Cancer [facility]) (2013); and Asthma.  Presented with dyspnea and chest tightness the past 1 week worsening cough and sneezing Started with cold symptoms and progressed. Cough has been productive of white sputum. Patient endorses any fevers. Patient self treated with amoxicillin that she had around the house but did not seem to improve. He reports some pain in the chest with coughing.  In emergency department given history of dyspnea and past history of CLL patient had a CT scan of the chest done which showed bibasilar bronchopneumonia he was ordered one dose of Levaquin. Habits or color was noted to be 26.6 which is around his baseline. platelets 179 hemoglobin 11.5 creatinine was noted to be elevated  1.79 which is his baseline. Troponin was within normal limits no significant ECG changes.  CT scan of the chest also showed diffuse axillary as well as mediastinal lymphadenopathy possibly new   Patient also has history of CLL with chronic mesenteric lymphadenopathy in April patient was seen by oncology and felt to be symptomatic not require any treatment at that time plan to continue to follow.   Patient has history of coronary disease status post cardiac bypass in 1986 and 1996 last cardiac catheterization 2014 requiring stent placement of note last echogram was in 2014 showing grade 1 diastolic dysfunction Hospitalist was called for admission for [place]:   CAP (community acquired pneumonia)- leukocytosis Initially treated with Rocephin and Zithromax, now switched to Omnicef/azithromycin 7 days - cough improving- ambulate and follow for improvement in dyspnea - not requiring O2 at rest or with ambulation    Chest tightness - likely due to above pneumonia and not ACS- EKG unrevealing Troponin negative 3  Prolonged QTc - follow- repeat EKG tomorrow - check magnesium - K and Ca+ are normal   Essential hypertension - cont Amlodipine & Enalapril HCTZ on hold in the setting of pneumonia and maybe resumed by PCP   Coronary atherosclerosis - s/p CABG and stent - cont ASA 81 mg daily and Plavix   Chronic kidney disease (CKD), stage III (moderate) - stable during this hospitalization   Chronic diastolic CHF (congestive heart failure), NYHA class 1 ([place]) -  compensated-     Thrombocytopenia (HCC) white cell count of 26,000. Hemoglobin 11.5 platelet count 179      Seen by oncology,He has history of CLL versus low-grade non-Hodgkin's lymphoma has CAT scans set up for January Follow-up with oncology in 3 weeks   BPH -cont Flomax   Discharge Exam:    Blood pressure 112/60, pulse 71, temperature 98.2 F (36.8 C), temperature source Oral, resp. rate 18,  height 5' 8"  (1.727 m), weight 83.054 kg (183 lb 1.6 oz), SpO2 97 %.   1. General: in No Acute distress 2. Psychological: Alert and Oriented  3. Head/ENT: Moist Mucous Membranes  Head Non traumatic, neck supple  Normal Dentition 4. SKIN: decreased Skin turgor, Skin clean Dry and intact no rash 5. Heart: Regular rate and rhythm no Murmur, Rub or gallop 6. Lungs: no wheezes some crackles  7. Abdomen: Soft, non-tender, Non distended 8. Lower extremities: no clubbing, cyanosis, or edema 9. Neurologically Grossly intact, moving all 4 extremities equally 10. MSK: Normal range of motion    Follow-up Information    Follow up with Olin Hauser, MD. Schedule an appointment as soon as possible for a visit in 3 days.   Specialty:  Family Medicine   Contact information:   [place] [id number] [phone]       Follow up with [name], MD. Schedule an appointment as soon as possible for a visit in 2 weeks.   Specialty:  Oncology   Contact information:   [place] [address] [phone]       Signed: Reyne Dumas 10/05/2015, 9:59 AM        Time spent >45 mins

## 2015-10-06 LAB — IMMUNOGLOBULINS A/E/G/M, SERUM
IGA: 18 mg/dL — AB (ref 61–437)
IgE (Immunoglobulin E), Serum: 2 IU/mL (ref 0–100)
IgG (Immunoglobin G), Serum: 304 mg/dL — ABNORMAL LOW (ref 700–1600)
IgM, Serum: <5 mg/dL — ABNORMAL LOW (ref 15–143)

## 2015-10-09 LAB — CULTURE, BLOOD (ROUTINE X 2)
Culture: NO GROWTH
Culture: NO GROWTH

## 2015-10-21 ENCOUNTER — Encounter: Payer: Self-pay | Admitting: Hematology & Oncology

## 2015-10-21 ENCOUNTER — Ambulatory Visit (HOSPITAL_BASED_OUTPATIENT_CLINIC_OR_DEPARTMENT_OTHER): Payer: Medicare Other | Admitting: Hematology & Oncology

## 2015-10-21 ENCOUNTER — Ambulatory Visit (HOSPITAL_BASED_OUTPATIENT_CLINIC_OR_DEPARTMENT_OTHER)
Admission: RE | Admit: 2015-10-21 | Discharge: 2015-10-21 | Disposition: A | Discharge status: Home / Self Care | Payer: Medicare Other | Source: Ambulatory Visit | Attending: Hematology & Oncology | Admitting: Hematology & Oncology

## 2015-10-21 ENCOUNTER — Ambulatory Visit (HOSPITAL_BASED_OUTPATIENT_CLINIC_OR_DEPARTMENT_OTHER): Payer: Medicare Other

## 2015-10-21 VITALS — BP 147/71 | HR 59 | Temp 97.6°F | Resp 16 | Ht 68.0 in | Wt 188.0 lb

## 2015-10-21 DIAGNOSIS — C9110 Chronic lymphocytic leukemia of B-cell type not having achieved remission: Secondary | ICD-10-CM | POA: Insufficient documentation

## 2015-10-21 DIAGNOSIS — J181 Lobar pneumonia, unspecified organism: Secondary | ICD-10-CM

## 2015-10-21 DIAGNOSIS — C8590 Non-Hodgkin lymphoma, unspecified, unspecified site: Secondary | ICD-10-CM

## 2015-10-21 DIAGNOSIS — J189 Pneumonia, unspecified organism: Secondary | ICD-10-CM

## 2015-10-21 DIAGNOSIS — D801 Nonfamilial hypogammaglobulinemia: Secondary | ICD-10-CM

## 2015-10-21 DIAGNOSIS — R590 Localized enlarged lymph nodes: Secondary | ICD-10-CM

## 2015-10-21 HISTORY — DX: Nonfamilial hypogammaglobulinemia: D80.1

## 2015-10-21 HISTORY — DX: Chronic lymphocytic leukemia of B-cell type not having achieved remission: C91.10

## 2015-10-21 LAB — CBC WITH DIFFERENTIAL (CANCER CENTER ONLY)
HCT: 36 % — ABNORMAL LOW (ref 38.7–49.9)
HGB: 11.3 g/dL — ABNORMAL LOW (ref 13.0–17.1)
MCH: 30.6 pg (ref 28.0–33.4)
MCHC: 31.4 g/dL — ABNORMAL LOW (ref 32.0–35.9)
MCV: 98 fL (ref 82–98)
PLATELETS: 292 10*3/uL (ref 145–400)
RBC: 3.69 10*6/uL — AB (ref 4.20–5.70)
RDW: 16.1 % — AB (ref 11.1–15.7)
WBC: 32.1 10*3/uL — AB (ref 4.0–10.0)

## 2015-10-21 LAB — MANUAL DIFFERENTIAL (CHCC SATELLITE)
ALC: 26.6 10*3/uL — AB (ref 0.9–3.3)
ANC (CHCC MAN DIFF): 3.9 10*3/uL (ref 1.5–6.5)
EOS: 3 % (ref 0–7)
LYMPH: 83 % — ABNORMAL HIGH (ref 14–48)
MONO: 2 % (ref 0–13)
PLT EST ~~LOC~~: ADEQUATE
Platelet Morphology: NORMAL
SEG: 12 % — ABNORMAL LOW (ref 40–75)

## 2015-10-21 LAB — CMP (CANCER CENTER ONLY)
ALK PHOS: 56 U/L (ref 26–84)
ALT(SGPT): 22 U/L (ref 10–47)
AST: 18 U/L (ref 11–38)
Albumin: 3.5 g/dL (ref 3.3–5.5)
BUN: 29 mg/dL — AB (ref 7–22)
CALCIUM: 9.7 mg/dL (ref 8.0–10.3)
CHLORIDE: 100 meq/L (ref 98–108)
CO2: 27 mEq/L (ref 18–33)
Creat: 1.5 mg/dl — ABNORMAL HIGH (ref 0.6–1.2)
Glucose, Bld: 111 mg/dL (ref 73–118)
POTASSIUM: 3.9 meq/L (ref 3.3–4.7)
Sodium: 143 mEq/L (ref 128–145)
Total Bilirubin: 0.6 mg/dl (ref 0.20–1.60)
Total Protein: 6.5 g/dL (ref 6.4–8.1)

## 2015-10-21 LAB — CHCC SATELLITE - SMEAR

## 2015-10-21 NOTE — Progress Notes (Signed)
Hematology and Oncology Follow Up Visit  [name] [name] [id number] May 05, 1931 81 y.o. 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 He was hospitalized with pneumonia. This was about a month ago. He has had pneumonia in the past.  We did check his IgG level. It was quite low at 304.  I think he clearly is going to need IVIG infusions. I really think that he is at risk for pneumonia in the future.  He does feel a little bit tired. We did do a chest x-ray on him today. This showed resolution of the pneumonia.  He's had no cough. He's had no nausea or vomiting.  He did have a good Thanksgiving. He was with his family.  He's had no palpable lymph glands.  He's had no change in bowel or bladder habits.   Overall, his performance status is ECOG 2.  Medications:  Current outpatient prescriptions:  .  allopurinol (ZYLOPRIM) 100 MG tablet, Take 100 mg by mouth daily as needed (for flareup). , Disp: , Rfl:  .  amLODipine (NORVASC) 5 MG tablet, Take 1 tablet (5 mg total) by mouth daily., Disp: 90 tablet, Rfl: 3 .  aspirin EC 81 MG tablet, Take 81 mg by mouth daily., Disp: , Rfl:  .  azithromycin (ZITHROMAX) 500 MG tablet, Take 1 tablet (500 mg total) by mouth daily., Disp: 7 tablet, Rfl: 0 .  calcium carbonate (TUMS - DOSED IN MG ELEMENTAL CALCIUM) 500 MG chewable tablet, Chew 1 tablet by mouth at bedtime. , Disp: , Rfl:  .  cefdinir (OMNICEF) 300 MG capsule, Take 1 capsule (300 mg total) by mouth 2 (two) times daily., Disp: 14 capsule, Rfl: 0 .  clopidogrel (PLAVIX) 75 MG tablet, TAKE ONE TABLET DAILY WITH BREAKFAST, Disp: 90 tablet, Rfl: 3 .  diphenhydrAMINE (BENADRYL) 25 MG tablet, Take 25 mg by mouth at bedtime. , Disp: , Rfl:  .  enalapril (VASOTEC) 10 MG tablet, Take 1  tablet (10 mg total) by mouth daily., Disp: 90 tablet, Rfl: 3 .  metoprolol (LOPRESSOR) 50 MG tablet, Take 0.5 tablets (25 mg total) by mouth 2 (two) times daily., Disp: 180 tablet, Rfl: 3 .  nitroGLYCERIN (NITROSTAT) 0.4 MG SL tablet, Place 1 tablet (0.4 mg total) under the tongue every 5 (five) minutes as needed for chest pain., Disp: 25 tablet, Rfl: 3 .  Omega-3 Fatty Acids (FISH OIL) 1200 MG CAPS, Take 1,200 mg by mouth 2 (two) times daily. , Disp: , Rfl:  .  Red Yeast Rice 600 MG TABS, Take 1,200 mg by mouth at bedtime. , Disp: , Rfl:  .  Tamsulosin HCl (FLOMAX) 0.4 MG CAPS, Take 0.4 mg by mouth daily as needed (for urination). , Disp: , Rfl:  .  triamcinolone ointment (KENALOG) 0.1 %, Apply 1 application topically 2 (two) times daily as needed (dermatitis)., Disp: , Rfl:   Allergies:  Allergies  Allergen Reactions  . Uloric [Febuxostat] Other (See Comments)    Pt states "it locked me up, I couldn't move from the waist up" Upper body paralysis-temporary  . Ivp Dye [Iodinated Diagnostic Agents] Other (See Comments)    Patient continued sneezing   . Statins Other (See Comments)    Patient unable to walk    Past Medical History, Surgical  history, Social history, and Family History were reviewed and updated.  Review of Systems: As above  Physical Exam:  height is 5\' 8"  (1.727 m) and weight is 188 lb (85.276 kg). His oral temperature is 97.6 F (36.4 C). His blood pressure is 147/71 and his pulse is 59. His respiration is 16.   Wt Readings from Last 3 Encounters:  10/21/15 188 lb (85.276 kg)  10/05/15 183 lb 1.6 oz (83.054 kg)  07/27/15 184 lb 12.8 oz (83.825 kg)     Well-developed and well-nourished elderly gentleman. Head and neck exam shows no ocular or oral lesions. There is no adenopathy in the neck or super clicker regions. Lungs are with some decrease of the bases. Cardiac exam regular rate and rhythm with no murmurs, rubs or bruits. Axillary exam does show some small left  axillary lymph nodes that measure about 1 cm and are mobile and nontender. Abdomen is soft. Has good bowel sounds. There is no fluid wave. There is no palpable liver or spleen tip. There is no inguinal adenopathy. Extremities shows no clubbing, cyanosis or edema. Has some age related changes in his joints. Skin exam shows no rashes, ecchymoses or petechia.  Lab Results  Component Value Date   WBC 32.1* 10/21/2015   HGB 11.3* 10/21/2015   HCT 36.0* 10/21/2015   MCV 98 10/21/2015   PLT 292 10/21/2015     Chemistry      Component Value Date/Time   NA 143 10/21/2015 0936   NA 138 10/04/2015 0252   NA 141 07/08/2015 1101   K 3.9 10/21/2015 0936   K 3.8 10/04/2015 0252   K 4.3 07/08/2015 1101   CL 100 10/21/2015 0936   CL 107 10/04/2015 0252   CO2 27 10/21/2015 0936   CO2 20* 10/04/2015 0252   CO2 22 07/08/2015 1101   BUN 29* 10/21/2015 0936   BUN 25* 10/04/2015 0252   BUN 29.7* 07/08/2015 1101   CREATININE 1.5* 10/21/2015 0936   CREATININE 1.68* 10/04/2015 0252   CREATININE 1.7* 07/08/2015 1101      Component Value Date/Time   CALCIUM 9.7 10/21/2015 0936   CALCIUM 8.2* 10/04/2015 0252   CALCIUM 9.7 07/08/2015 1101   ALKPHOS 56 10/21/2015 0936   ALKPHOS 49 10/04/2015 0252   ALKPHOS 61 07/08/2015 1101   AST 18 10/21/2015 0936   AST 15 10/04/2015 0252   AST 14 07/08/2015 1101   ALT 22 10/21/2015 0936   ALT 12* 10/04/2015 0252   ALT 12 07/08/2015 1101   BILITOT 0.60 10/21/2015 0936   BILITOT 0.6 10/04/2015 0252   BILITOT 0.34 07/08/2015 1101         Impression and Plan: [name] [name] is 81 year old gentleman.   I believe that we will have to start the IVIG on him. Again, his IgG level is quite low. This will put him at much higher risk for recurrent pneumonia.  As far as treating his CLL, I still think we are probably hold off on this. His blood counts look okay outside of the leukocytosis. This is mostly lymphocytes which goes along with his CLL.  I talked to he and  his wife for about 30-40 minutes. I explained to them why I thought the IVIG would help. I told him that his CLL was not going to be a problem that we would have to treat for right now. However, I think that sometime next year, we probably will have to.  We will send him out with IVIG in  a couple weeks. Per graph we will do this monthly.  I will see him back in early January.    [name], MD 11/25/201611:09 AM

## 2015-10-26 LAB — PROTEIN ELECTROPHORESIS, SERUM, WITH REFLEX
ALBUMIN ELP: 3.8 g/dL (ref 3.8–4.8)
ALPHA-1-GLOBULIN: 0.3 g/dL (ref 0.2–0.3)
ALPHA-2-GLOBULIN: 0.9 g/dL (ref 0.5–0.9)
BETA GLOBULIN: 0.4 g/dL (ref 0.4–0.6)
Beta 2: 0.2 g/dL (ref 0.2–0.5)
Gamma Globulin: 0.3 g/dL — ABNORMAL LOW (ref 0.8–1.7)
Total Protein, Serum Electrophoresis: 6.2 g/dL (ref 6.1–8.1)

## 2015-10-26 LAB — IGG, IGA, IGM
IgA: 18 mg/dL — ABNORMAL LOW (ref 68–379)
IgG (Immunoglobin G), Serum: 342 mg/dL — ABNORMAL LOW (ref 650–1600)
IgM, Serum: <5 mg/dL — ABNORMAL LOW (ref 41–251)

## 2015-10-26 LAB — IFE INTERPRETATION

## 2015-10-26 LAB — LACTATE DEHYDROGENASE: LDH: 203 U/L (ref 94–250)

## 2015-11-02 ENCOUNTER — Other Ambulatory Visit: Payer: Self-pay | Admitting: Cardiology

## 2015-11-02 DIAGNOSIS — I6523 Occlusion and stenosis of bilateral carotid arteries: Secondary | ICD-10-CM

## 2015-11-03 ENCOUNTER — Other Ambulatory Visit (HOSPITAL_BASED_OUTPATIENT_CLINIC_OR_DEPARTMENT_OTHER): Payer: Medicare Other

## 2015-11-03 ENCOUNTER — Ambulatory Visit (HOSPITAL_BASED_OUTPATIENT_CLINIC_OR_DEPARTMENT_OTHER): Payer: Medicare Other

## 2015-11-03 VITALS — BP 148/71 | HR 59 | Temp 97.5°F | Resp 18

## 2015-11-03 DIAGNOSIS — D801 Nonfamilial hypogammaglobulinemia: Secondary | ICD-10-CM | POA: Diagnosis not present

## 2015-11-03 DIAGNOSIS — C801 Malignant (primary) neoplasm, unspecified: Secondary | ICD-10-CM | POA: Diagnosis not present

## 2015-11-03 DIAGNOSIS — C9110 Chronic lymphocytic leukemia of B-cell type not having achieved remission: Secondary | ICD-10-CM | POA: Diagnosis not present

## 2015-11-03 LAB — CMP (CANCER CENTER ONLY)
ALK PHOS: 45 U/L (ref 26–84)
ALT: 18 U/L (ref 10–47)
AST: 15 U/L (ref 11–38)
Albumin: 3.5 g/dL (ref 3.3–5.5)
BILIRUBIN TOTAL: 0.6 mg/dL (ref 0.20–1.60)
BUN: 28 mg/dL — AB (ref 7–22)
CALCIUM: 9.5 mg/dL (ref 8.0–10.3)
CO2: 27 mEq/L (ref 18–33)
Chloride: 101 mEq/L (ref 98–108)
Creat: 1.6 mg/dl — ABNORMAL HIGH (ref 0.6–1.2)
GLUCOSE: 114 mg/dL (ref 73–118)
Potassium: 3.9 mEq/L (ref 3.3–4.7)
Sodium: 137 mEq/L (ref 128–145)
TOTAL PROTEIN: 6 g/dL — AB (ref 6.4–8.1)

## 2015-11-03 LAB — CBC WITH DIFFERENTIAL (CANCER CENTER ONLY)
HCT: 36.9 % — ABNORMAL LOW (ref 38.7–49.9)
HEMOGLOBIN: 11.7 g/dL — AB (ref 13.0–17.1)
MCH: 31.1 pg (ref 28.0–33.4)
MCHC: 31.7 g/dL — ABNORMAL LOW (ref 32.0–35.9)
MCV: 98 fL (ref 82–98)
PLATELETS: 126 10*3/uL — AB (ref 145–400)
RBC: 3.76 10*6/uL — AB (ref 4.20–5.70)
RDW: 16.1 % — ABNORMAL HIGH (ref 11.1–15.7)
WBC: 46.6 10*3/uL — AB (ref 4.0–10.0)

## 2015-11-03 LAB — MANUAL DIFFERENTIAL (CHCC SATELLITE)
ALC: 40.1 10*3/uL — ABNORMAL HIGH (ref 0.9–3.3)
ANC (CHCC MAN DIFF): 5.6 10*3/uL (ref 1.5–6.5)
LYMPH: 86 % — ABNORMAL HIGH (ref 14–48)
MONO: 2 % (ref 0–13)
PLT EST ~~LOC~~: DECREASED
SEG: 12 % — ABNORMAL LOW (ref 40–75)

## 2015-11-03 LAB — CHCC SATELLITE - SMEAR

## 2015-11-03 MED ORDER — ACETAMINOPHEN 325 MG PO TABS
ORAL_TABLET | ORAL | Status: AC
  Filled 2015-11-03: qty 2

## 2015-11-03 MED ORDER — IMMUNE GLOBULIN (HUMAN) 10 GM/100ML IV SOLN
0.4000 g/kg | Freq: Once | INTRAVENOUS | Status: AC
  Administered 2015-11-03: 35 g via INTRAVENOUS
  Filled 2015-11-03: qty 350

## 2015-11-03 MED ORDER — ACETAMINOPHEN 325 MG PO TABS
650.0000 mg | ORAL_TABLET | Freq: Four times a day (QID) | ORAL | Status: DC | PRN
  Administered 2015-11-03: 650 mg via ORAL

## 2015-11-03 MED ORDER — SODIUM CHLORIDE 0.9 % IV SOLN
Freq: Once | INTRAVENOUS | Status: AC
  Administered 2015-11-03: 11:00:00 via INTRAVENOUS

## 2015-11-03 MED ORDER — DIPHENHYDRAMINE HCL 25 MG PO TABS
25.0000 mg | ORAL_TABLET | Freq: Four times a day (QID) | ORAL | Status: DC | PRN
  Administered 2015-11-03: 25 mg via ORAL
  Filled 2015-11-03: qty 1

## 2015-11-03 MED ORDER — DIPHENHYDRAMINE HCL 25 MG PO CAPS
ORAL_CAPSULE | ORAL | Status: AC
  Filled 2015-11-03: qty 1

## 2015-11-03 NOTE — Patient Instructions (Signed)

## 2015-11-04 LAB — IGG, IGA, IGM
IGG (IMMUNOGLOBIN G), SERUM: 317 mg/dL — AB (ref 650–1600)
IgA: 16 mg/dL — ABNORMAL LOW (ref 68–379)
IgM, Serum: <5 mg/dL — ABNORMAL LOW (ref 41–251)

## 2015-11-09 ENCOUNTER — Ambulatory Visit (HOSPITAL_COMMUNITY)
Admission: RE | Admit: 2015-11-09 | Discharge: 2015-11-09 | Disposition: A | Discharge status: Home / Self Care | Payer: Medicare Other | Source: Ambulatory Visit | Attending: Cardiology | Admitting: Cardiology

## 2015-11-09 DIAGNOSIS — N183 Chronic kidney disease, stage 3 (moderate): Secondary | ICD-10-CM | POA: Diagnosis not present

## 2015-11-09 DIAGNOSIS — I6523 Occlusion and stenosis of bilateral carotid arteries: Secondary | ICD-10-CM

## 2015-11-09 DIAGNOSIS — I129 Hypertensive chronic kidney disease with stage 1 through stage 4 chronic kidney disease, or unspecified chronic kidney disease: Secondary | ICD-10-CM | POA: Diagnosis not present

## 2015-11-09 DIAGNOSIS — E785 Hyperlipidemia, unspecified: Secondary | ICD-10-CM | POA: Insufficient documentation

## 2015-11-29 ENCOUNTER — Telehealth: Payer: Self-pay | Admitting: Hematology & Oncology

## 2015-11-29 NOTE — Telephone Encounter (Signed)
Sent recent office note to [facility] [phone] release id)

## 2015-12-01 ENCOUNTER — Other Ambulatory Visit (HOSPITAL_BASED_OUTPATIENT_CLINIC_OR_DEPARTMENT_OTHER): Payer: Medicare Other

## 2015-12-01 ENCOUNTER — Other Ambulatory Visit: Payer: Self-pay | Admitting: Family

## 2015-12-01 ENCOUNTER — Ambulatory Visit (HOSPITAL_BASED_OUTPATIENT_CLINIC_OR_DEPARTMENT_OTHER): Payer: Medicare Other

## 2015-12-01 ENCOUNTER — Encounter: Payer: Self-pay | Admitting: Family

## 2015-12-01 ENCOUNTER — Ambulatory Visit (HOSPITAL_BASED_OUTPATIENT_CLINIC_OR_DEPARTMENT_OTHER): Payer: Medicare Other | Admitting: Family

## 2015-12-01 VITALS — BP 140/95 | HR 64 | Temp 97.3°F | Resp 16 | Ht 68.0 in | Wt 184.0 lb

## 2015-12-01 DIAGNOSIS — D801 Nonfamilial hypogammaglobulinemia: Secondary | ICD-10-CM

## 2015-12-01 DIAGNOSIS — C9110 Chronic lymphocytic leukemia of B-cell type not having achieved remission: Secondary | ICD-10-CM

## 2015-12-01 DIAGNOSIS — C8590 Non-Hodgkin lymphoma, unspecified, unspecified site: Secondary | ICD-10-CM

## 2015-12-01 DIAGNOSIS — R590 Localized enlarged lymph nodes: Secondary | ICD-10-CM

## 2015-12-01 LAB — CMP (CANCER CENTER ONLY)
ALBUMIN: 3.5 g/dL (ref 3.3–5.5)
ALK PHOS: 46 U/L (ref 26–84)
ALT: 17 U/L (ref 10–47)
AST: 17 U/L (ref 11–38)
BILIRUBIN TOTAL: 0.5 mg/dL (ref 0.20–1.60)
BUN, Bld: 30 mg/dL — ABNORMAL HIGH (ref 7–22)
CALCIUM: 9.1 mg/dL (ref 8.0–10.3)
CO2: 26 mEq/L (ref 18–33)
Chloride: 101 mEq/L (ref 98–108)
Creat: 1.7 mg/dl — ABNORMAL HIGH (ref 0.6–1.2)
Glucose, Bld: 98 mg/dL (ref 73–118)
Potassium: 3.9 mEq/L (ref 3.3–4.7)
Sodium: 140 mEq/L (ref 128–145)
TOTAL PROTEIN: 6.6 g/dL (ref 6.4–8.1)

## 2015-12-01 LAB — MANUAL DIFFERENTIAL (CHCC SATELLITE)
ALC: 24.8 10*3/uL — AB (ref 0.9–3.3)
ANC (CHCC MAN DIFF): 4.1 10*3/uL (ref 1.5–6.5)
Band Neutrophils: 1 % (ref 0–10)
LYMPH: 85 % — AB (ref 14–48)
MONO: 1 % (ref 0–13)
PLT EST ~~LOC~~: ADEQUATE
RBC COMMENTS: NORMAL
SEG: 13 % — AB (ref 40–75)

## 2015-12-01 LAB — CBC WITH DIFFERENTIAL (CANCER CENTER ONLY)
HEMATOCRIT: 38 % — AB (ref 38.7–49.9)
HEMOGLOBIN: 12.1 g/dL — AB (ref 13.0–17.1)
MCH: 31 pg (ref 28.0–33.4)
MCHC: 31.8 g/dL — ABNORMAL LOW (ref 32.0–35.9)
MCV: 97 fL (ref 82–98)
Platelets: 198 10*3/uL (ref 145–400)
RBC: 3.9 10*6/uL — ABNORMAL LOW (ref 4.20–5.70)
RDW: 16 % — ABNORMAL HIGH (ref 11.1–15.7)
WBC: 29.2 10*3/uL — AB (ref 4.0–10.0)

## 2015-12-01 MED ORDER — ALTEPLASE 2 MG IJ SOLR
2.0000 mg | Freq: Once | INTRAMUSCULAR | Status: DC | PRN
  Filled 2015-12-01: qty 2

## 2015-12-01 MED ORDER — SODIUM CHLORIDE 0.9 % IJ SOLN
10.0000 mL | INTRAMUSCULAR | Status: DC | PRN
  Filled 2015-12-01: qty 10

## 2015-12-01 MED ORDER — SODIUM CHLORIDE 0.9 % IJ SOLN
3.0000 mL | Freq: Once | INTRAMUSCULAR | Status: DC | PRN
  Filled 2015-12-01: qty 10

## 2015-12-01 MED ORDER — ACETAMINOPHEN 325 MG PO TABS
650.0000 mg | ORAL_TABLET | Freq: Once | ORAL | Status: AC
  Administered 2015-12-01: 650 mg via ORAL

## 2015-12-01 MED ORDER — DIPHENHYDRAMINE HCL 25 MG PO CAPS
ORAL_CAPSULE | ORAL | Status: AC
Start: 2015-12-01 — End: 2015-12-01
  Filled 2015-12-01: qty 1

## 2015-12-01 MED ORDER — HEPARIN SOD (PORK) LOCK FLUSH 100 UNIT/ML IV SOLN
500.0000 [IU] | Freq: Once | INTRAVENOUS | Status: DC | PRN
  Filled 2015-12-01: qty 5

## 2015-12-01 MED ORDER — IMMUNE GLOBULIN (HUMAN) 5 GM/50ML IV SOLN
400.0000 mg/kg | Freq: Once | INTRAVENOUS | Status: AC
  Administered 2015-12-01: 35 g via INTRAVENOUS
  Filled 2015-12-01: qty 50

## 2015-12-01 MED ORDER — HEPARIN SOD (PORK) LOCK FLUSH 100 UNIT/ML IV SOLN
250.0000 [IU] | Freq: Once | INTRAVENOUS | Status: DC | PRN
  Filled 2015-12-01: qty 5

## 2015-12-01 MED ORDER — ACETAMINOPHEN 325 MG PO TABS
ORAL_TABLET | ORAL | Status: AC
  Filled 2015-12-01: qty 2

## 2015-12-01 MED ORDER — IMMUNE GLOBULIN (HUMAN) 5 GM/50ML IV SOLN
400.0000 mg/kg | Freq: Once | INTRAVENOUS | Status: DC
  Filled 2015-12-01: qty 50

## 2015-12-01 MED ORDER — DIPHENHYDRAMINE HCL 25 MG PO TABS
25.0000 mg | ORAL_TABLET | Freq: Once | ORAL | Status: AC
  Administered 2015-12-01: 25 mg via ORAL
  Filled 2015-12-01: qty 1

## 2015-12-01 NOTE — Progress Notes (Signed)
Hematology and Oncology Follow Up Visit  [name] [name] [id number] 1931/07/26 82 y.o. 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 He is doing well and has no complaints at this time.  He states that he did have joint aches "all over" after receiving IVIG in December. This last for several days and then went away.  He has had no more problems with infections. His pneumonia has resolved. No fever, chills, n/v, cough, rash, dizziness, SOB, chest pain, palpitations, abdominal pain or changes in bowel of bladder habits.  He stays quite active tinkering around his house and garage. He enjoys rebuilding care engines. He has also been chopping wood and uses a stationary bike for 10 minutes each evening.  He has a healthy appetite and is staying well hydrated. His weight is stable.  No swelling, tenderness, numbness or tingling in his extremities. No c/o pain at this time.   Medications:    Medication List       This list is accurate as of: 12/01/15 12:22 PM.  Always use your most recent med list.               allopurinol 100 MG tablet  Commonly known as:  ZYLOPRIM  Take 100 mg by mouth daily as needed (for flareup).     amLODipine 5 MG tablet  Commonly known as:  NORVASC  Take 1 tablet (5 mg total) by mouth daily.     aspirin EC 81 MG tablet  Take 81 mg by mouth daily.     azithromycin 500 MG tablet  Commonly known as:  ZITHROMAX  Take 1 tablet (500 mg total) by mouth daily.     calcium carbonate 500 MG chewable tablet  Commonly known as:  TUMS - dosed in mg elemental calcium  Chew 1 tablet by mouth at bedtime.     cefdinir 300 MG capsule  Commonly known as:  OMNICEF  Take 1 capsule (300 mg total) by mouth 2 (two) times daily.     clopidogrel 75 MG tablet  Commonly known as:  PLAVIX  TAKE ONE TABLET DAILY WITH BREAKFAST     diphenhydrAMINE 25 MG tablet  Commonly known as:  BENADRYL  Take 25 mg by mouth at bedtime.     enalapril 10 MG tablet  Commonly known as:  VASOTEC  Take 1 tablet (10 mg total) by mouth daily.     Fish Oil 1200 MG Caps  Take 1,200 mg by mouth 2 (two) times daily.     metoprolol 50 MG tablet  Commonly known as:  LOPRESSOR  Take 0.5 tablets (25 mg total) by mouth 2 (two) times daily.     nitroGLYCERIN 0.4 MG SL tablet  Commonly known as:  NITROSTAT  Place 1 tablet (0.4 mg total) under the tongue every 5 (five) minutes as needed for chest pain.     Red Yeast Rice 600 MG Tabs  Take 1,200 mg by mouth at bedtime.     tamsulosin 0.4 MG Caps capsule  Commonly known as:  FLOMAX  Take 0.4 mg by mouth daily as needed (for urination).     triamcinolone ointment 0.1 %  Commonly known as:  KENALOG  Apply 1 application topically 2 (two) times daily as needed (dermatitis).  Allergies:  Allergies  Allergen Reactions  . Uloric [Febuxostat] Other (See Comments)    Pt states "it locked me up, I couldn't move from the waist up" Upper body paralysis-temporary  . Ivp Dye [Iodinated Diagnostic Agents] Other (See Comments)    Patient continued sneezing   . Statins Other (See Comments)    Patient unable to walk    Past Medical History, Surgical history, Social history, and Family History were reviewed and updated.  Review of Systems: All other 10 point review of systems is negative.   Physical Exam:  height is 5\' 8"  (1.727 m) and weight is 184 lb (83.462 kg). His oral temperature is 97.3 F (36.3 C). His blood pressure is 140/95 and his pulse is 64. His respiration is 16.   Wt Readings from Last 3 Encounters:  12/01/15 184 lb (83.462 kg)  10/21/15 188 lb (85.276 kg)  10/05/15 183 lb 1.6 oz (83.054 kg)    Ocular: Sclerae unicteric, pupils equal, round and reactive to  light Ear-nose-throat: Oropharynx clear, dentition fair Lymphatic: No cervical supraclavicular or axillary adenopathy Lungs no rales or rhonchi, good excursion bilaterally Heart regular rate and rhythm, no murmur appreciated Abd soft, nontender, positive bowel sounds, no liver or spleen tip palpated on exam MSK no focal spinal tenderness, no joint edema Neuro: non-focal, well-oriented, appropriate affect Breasts: Deferred  Lab Results  Component Value Date   WBC 29.2* 12/01/2015   HGB 12.1* 12/01/2015   HCT 38.0* 12/01/2015   MCV 97 12/01/2015   PLT 198 12/01/2015   No results found for: FERRITIN, IRON, TIBC, UIBC, IRONPCTSAT Lab Results  Component Value Date   RBC 3.90* 12/01/2015   No results found for: Nils Pyle [facility] Lab Results  Component Value Date   IGGSERUM 317* 11/03/2015   IGA 16* 11/03/2015   IGMSERUM <5* 11/03/2015   Lab Results  Component Value Date   TOTALPROTELP 6.2 10/21/2015   ALBUMINELP 3.8 10/21/2015   A1GS 0.3 10/21/2015   A2GS 0.9 10/21/2015   BETS 0.4 10/21/2015   BETA2SER 0.2 10/21/2015   GAMS 0.3* 10/21/2015   SPEI * 10/21/2015     Chemistry      Component Value Date/Time   NA 140 12/01/2015 0958   NA 138 10/04/2015 0252   NA 141 07/08/2015 1101   K 3.9 12/01/2015 0958   K 3.8 10/04/2015 0252   K 4.3 07/08/2015 1101   CL 101 12/01/2015 0958   CL 107 10/04/2015 0252   CO2 26 12/01/2015 0958   CO2 20* 10/04/2015 0252   CO2 22 07/08/2015 1101   BUN 30* 12/01/2015 0958   BUN 25* 10/04/2015 0252   BUN 29.7* 07/08/2015 1101   CREATININE 1.7* 12/01/2015 0958   CREATININE 1.68* 10/04/2015 0252   CREATININE 1.7* 07/08/2015 1101      Component Value Date/Time   CALCIUM 9.1 12/01/2015 0958   CALCIUM 8.2* 10/04/2015 0252   CALCIUM 9.7 07/08/2015 1101   ALKPHOS 46 12/01/2015 0958   ALKPHOS 49 10/04/2015 0252   ALKPHOS 61 07/08/2015 1101   AST 17 12/01/2015 0958   AST 15 10/04/2015 0252   AST 14 07/08/2015 1101    ALT 17 12/01/2015 0958   ALT 12* 10/04/2015 0252   ALT 12 07/08/2015 1101   BILITOT 0.50 12/01/2015 0958   BILITOT 0.6 10/04/2015 0252   BILITOT 0.34 07/08/2015 1101     Impression and Plan: [name] [name] is 82 year old gentleman with history of CLL and now hypogammaglobulinemia. His WBC count is  improving and is now down to 29.2. His pneumonia has resolved and he has had no other problems with infection.  He did well with receiving IVIG in December and only experienced some joint aches for a few days after.  He will get IVIG again today. His IgG level for today is pending. So far, he has done well and has not required treatment for the CLL. We will continue to follow along closely with him and monitor his labs.  We will plan to see him back in 1 months for labs, follow-up and infusion.  He will contact us with any questions or concerns. We can certainly see him sooner if need be.   Eliezer Bottom, NP 1/5/201712:22 PM

## 2015-12-01 NOTE — Patient Instructions (Signed)

## 2015-12-02 LAB — IGG, IGA, IGM
IGG (IMMUNOGLOBIN G), SERUM: 614 mg/dL — AB (ref 650–1600)
IgA: 17 mg/dL — ABNORMAL LOW (ref 68–379)
IgM, Serum: <5 mg/dL — ABNORMAL LOW (ref 41–251)

## 2015-12-16 ENCOUNTER — Telehealth: Payer: Self-pay | Admitting: Cardiology

## 2015-12-16 NOTE — Telephone Encounter (Signed)
New message     Calling to get date of last ov and bp reading

## 2015-12-16 NOTE — Telephone Encounter (Signed)
Encounter information provided for case manager, who voiced acknowledgment.

## 2015-12-26 ENCOUNTER — Other Ambulatory Visit (HOSPITAL_BASED_OUTPATIENT_CLINIC_OR_DEPARTMENT_OTHER): Payer: Medicare Other

## 2015-12-26 ENCOUNTER — Telehealth: Payer: Self-pay | Admitting: Hematology & Oncology

## 2015-12-26 DIAGNOSIS — C9110 Chronic lymphocytic leukemia of B-cell type not having achieved remission: Secondary | ICD-10-CM

## 2015-12-26 DIAGNOSIS — D801 Nonfamilial hypogammaglobulinemia: Secondary | ICD-10-CM

## 2015-12-26 LAB — COMPREHENSIVE METABOLIC PANEL
ALT: 15 U/L (ref 0–55)
AST: 13 U/L (ref 5–34)
Albumin: 3.4 g/dL — ABNORMAL LOW (ref 3.5–5.0)
Alkaline Phosphatase: 63 U/L (ref 40–150)
Anion Gap: 10 mEq/L (ref 3–11)
BUN: 21.5 mg/dL (ref 7.0–26.0)
CHLORIDE: 108 meq/L (ref 98–109)
CO2: 24 meq/L (ref 22–29)
CREATININE: 1.6 mg/dL — AB (ref 0.7–1.3)
Calcium: 9.2 mg/dL (ref 8.4–10.4)
EGFR: 40 mL/min/{1.73_m2} — AB (ref >90)
GLUCOSE: 138 mg/dL (ref 70–140)
POTASSIUM: 4 meq/L (ref 3.5–5.1)
SODIUM: 141 meq/L (ref 136–145)
TOTAL PROTEIN: 6.6 g/dL (ref 6.4–8.3)
Total Bilirubin: 0.46 mg/dL (ref 0.20–1.20)

## 2015-12-26 LAB — CBC WITH DIFFERENTIAL (CANCER CENTER ONLY)
HCT: 36.4 % — ABNORMAL LOW (ref 38.7–49.9)
HGB: 11.5 g/dL — ABNORMAL LOW (ref 13.0–17.1)
MCH: 31 pg (ref 28.0–33.4)
MCHC: 31.6 g/dL — ABNORMAL LOW (ref 32.0–35.9)
MCV: 98 fL (ref 82–98)
Platelets: 184 10*3/uL (ref 145–400)
RBC: 3.71 10*6/uL — ABNORMAL LOW (ref 4.20–5.70)
RDW: 16 % — ABNORMAL HIGH (ref 11.1–15.7)
WBC: 23.2 10*3/uL — ABNORMAL HIGH (ref 4.0–10.0)

## 2015-12-26 LAB — MANUAL DIFFERENTIAL (CHCC SATELLITE)
ALC: 18.1 10*3/uL — ABNORMAL HIGH (ref 0.9–3.3)
ANC (CHCC HP manual diff): 4.2 10*3/uL (ref 1.5–6.5)
LYMPH: 78 % — ABNORMAL HIGH (ref 14–48)
MONO: 4 % (ref 0–13)
PLT EST ~~LOC~~: ADEQUATE
RBC Comments: NORMAL
SEG: 18 % — AB (ref 40–75)

## 2015-12-26 LAB — LACTATE DEHYDROGENASE: LDH: 250 U/L — ABNORMAL HIGH (ref 125–245)

## 2015-12-26 LAB — CHCC SATELLITE - SMEAR

## 2015-12-26 NOTE — Telephone Encounter (Signed)
As part of our Prior Authorization Reduction program, this [employer] Medicare Advantage members plan does not currently require a prior authorization to receive these services in your state.  If the member receives this care outside of your state*, prior authorization may still be required before the services are delivered. You can request a prior authorization by calling the [employer] Clinical Request Line at [phone], option 4. The care provider delivering the services in the other state can also request a prior authorization online or over the phone.   If you have general questions about the prior authorization program, please call us at [phone], or visit:   [email or web address] > Clinician Resources > Cardiology > Cardiology Notification/Prior Authorization   [email or web address] > Clinician Resources > Radiology > Radiology Notification/Prior Authorization   [email or web address] > Clinician Resources > Oncology > Medicare Advantage Therapeutic Radiation.   Thank you.   *The Prior Authorization Reduction program currently applies to care providers in [place], [place], [place], [place], [place], [place] and [place] delivering care to [employer] members. Services delivered outside of those states may require prior authorization.  Please print and retain a copy of this confirmation in the patient's medical record. Physician Name: Dr. Burney Gauze Contact: Baxter Flattery Physician Address: [address] [place], [place] [id number] Phone Number: [phone] Fax Number: [phone]   Patient Name: MAEL MILCH Patient Id: [id number] Insurance Carrier: [employer]   Primary Diagnosis Code: C85.90 Description: Non-Hodgkin lymphoma, unspecified, unspecified site Secondary Diagnosis Code: R59.0 Description: Localized enlarged lymph nodes CPT Code [id number] Description: CT THORAX W/ CONTRAST Case  Number: [id number] Review Date: 12/26/2015 12:34:04 PM   Physician Name: Dr. Burney Gauze Contact: Baxter Flattery Physician Address: [address] [place], [place] [id number] Phone Number: [phone] Fax Number: [phone]   Patient Name: HARSHAN JOURDAN Patient Id: [id number] Insurance Carrier: [employer]MEDICARE   Primary Diagnosis Code: C85.90 Description: Non-Hodgkin lymphoma, unspecified, unspecified site Secondary Diagnosis Code: R59.0 Description: Localized enlarged lymph nodes CPT Code [id number] Description: CT ABDOMEN & PELVIS W/ Case Number: [id number] Review Date: 12/26/2015 12:36:15 PM

## 2015-12-27 LAB — IGG, IGA, IGM
IGM (IMMUNOGLOBIN M), SRM: 6 mg/dL — AB (ref 15–143)
IgA, Qn, Serum: 17 mg/dL — ABNORMAL LOW (ref 61–437)
IgG, Qn, Serum: 687 mg/dL — ABNORMAL LOW (ref 700–1600)

## 2015-12-28 LAB — PROTEIN ELECTROPHORESIS, SERUM, WITH REFLEX
A/G RATIO SPE: 1.2 (ref 0.7–1.7)
Albumin: 3.2 g/dL (ref 2.9–4.4)
Alpha 1: 0.2 g/dL (ref 0.0–0.4)
Alpha 2: 0.9 g/dL (ref 0.4–1.0)
Beta: 0.9 g/dL (ref 0.7–1.3)
GLOBULIN, TOTAL: 2.7 g/dL (ref 2.2–3.9)
Gamma Globulin: 0.6 g/dL (ref 0.4–1.8)
TOTAL PROTEIN: 5.9 g/dL — AB (ref 6.0–8.5)

## 2015-12-30 ENCOUNTER — Other Ambulatory Visit: Payer: Self-pay | Admitting: *Deleted

## 2015-12-30 DIAGNOSIS — Z91041 Radiographic dye allergy status: Secondary | ICD-10-CM

## 2015-12-30 MED ORDER — DIPHENHYDRAMINE HCL 50 MG PO TABS: ORAL_TABLET | ORAL | Status: DC

## 2015-12-30 MED ORDER — PREDNISONE 50 MG PO TABS: ORAL_TABLET | ORAL | Status: DC

## 2016-01-02 ENCOUNTER — Ambulatory Visit (HOSPITAL_BASED_OUTPATIENT_CLINIC_OR_DEPARTMENT_OTHER)
Admission: RE | Admit: 2016-01-02 | Discharge: 2016-01-02 | Disposition: A | Discharge status: Home / Self Care | Payer: Medicare Other | Source: Ambulatory Visit | Attending: Hematology & Oncology | Admitting: Hematology & Oncology

## 2016-01-02 ENCOUNTER — Ambulatory Visit (HOSPITAL_BASED_OUTPATIENT_CLINIC_OR_DEPARTMENT_OTHER): Payer: Medicare Other

## 2016-01-02 ENCOUNTER — Ambulatory Visit (HOSPITAL_BASED_OUTPATIENT_CLINIC_OR_DEPARTMENT_OTHER): Payer: Medicare Other | Admitting: Family

## 2016-01-02 ENCOUNTER — Other Ambulatory Visit: Payer: Medicare Other

## 2016-01-02 ENCOUNTER — Encounter: Payer: Self-pay | Admitting: Family

## 2016-01-02 VITALS — BP 144/49 | HR 71 | Temp 97.5°F | Resp 14 | Ht 68.0 in | Wt 189.0 lb

## 2016-01-02 DIAGNOSIS — M2550 Pain in unspecified joint: Secondary | ICD-10-CM

## 2016-01-02 DIAGNOSIS — C9110 Chronic lymphocytic leukemia of B-cell type not having achieved remission: Secondary | ICD-10-CM

## 2016-01-02 DIAGNOSIS — R05 Cough: Secondary | ICD-10-CM

## 2016-01-02 DIAGNOSIS — R599 Enlarged lymph nodes, unspecified: Secondary | ICD-10-CM | POA: Diagnosis not present

## 2016-01-02 DIAGNOSIS — D801 Nonfamilial hypogammaglobulinemia: Secondary | ICD-10-CM

## 2016-01-02 DIAGNOSIS — R911 Solitary pulmonary nodule: Secondary | ICD-10-CM | POA: Insufficient documentation

## 2016-01-02 DIAGNOSIS — R590 Localized enlarged lymph nodes: Secondary | ICD-10-CM | POA: Insufficient documentation

## 2016-01-02 DIAGNOSIS — C8590 Non-Hodgkin lymphoma, unspecified, unspecified site: Secondary | ICD-10-CM

## 2016-01-02 DIAGNOSIS — I700 Atherosclerosis of aorta: Secondary | ICD-10-CM | POA: Insufficient documentation

## 2016-01-02 DIAGNOSIS — R918 Other nonspecific abnormal finding of lung field: Secondary | ICD-10-CM | POA: Diagnosis not present

## 2016-01-02 LAB — CBC WITH DIFFERENTIAL (CANCER CENTER ONLY)
HEMATOCRIT: 37.3 % — AB (ref 38.7–49.9)
HEMOGLOBIN: 11.8 g/dL — AB (ref 13.0–17.1)
MCH: 30.6 pg (ref 28.0–33.4)
MCHC: 31.6 g/dL — AB (ref 32.0–35.9)
MCV: 97 fL (ref 82–98)
Platelets: 235 10*3/uL (ref 145–400)
RBC: 3.86 10*6/uL — ABNORMAL LOW (ref 4.20–5.70)
RDW: 15.7 % (ref 11.1–15.7)
WBC: 20.6 10*3/uL — ABNORMAL HIGH (ref 4.0–10.0)

## 2016-01-02 LAB — MANUAL DIFFERENTIAL (CHCC SATELLITE)
ALC: 13 10*3/uL — AB (ref 0.9–3.3)
ANC (CHCC HP manual diff): 7 10*3/uL — ABNORMAL HIGH (ref 1.5–6.5)
LYMPH: 63 % — ABNORMAL HIGH (ref 14–48)
MONO: 3 % (ref 0–13)
PLT EST ~~LOC~~: ADEQUATE
SEG: 34 % — ABNORMAL LOW (ref 40–75)

## 2016-01-02 LAB — COMPREHENSIVE METABOLIC PANEL
ALBUMIN: 3.5 g/dL (ref 3.5–5.0)
ALK PHOS: 67 U/L (ref 40–150)
ALT: 12 U/L (ref 0–55)
ANION GAP: 12 meq/L — AB (ref 3–11)
AST: 13 U/L (ref 5–34)
BUN: 23.9 mg/dL (ref 7.0–26.0)
CALCIUM: 9.3 mg/dL (ref 8.4–10.4)
CO2: 20 mEq/L — ABNORMAL LOW (ref 22–29)
CREATININE: 1.4 mg/dL — AB (ref 0.7–1.3)
Chloride: 105 mEq/L (ref 98–109)
EGFR: 45 mL/min/{1.73_m2} — ABNORMAL LOW (ref >90)
Glucose: 165 mg/dl — ABNORMAL HIGH (ref 70–140)
POTASSIUM: 4.3 meq/L (ref 3.5–5.1)
Sodium: 137 mEq/L (ref 136–145)
Total Bilirubin: 0.32 mg/dL (ref 0.20–1.20)
Total Protein: 6.9 g/dL (ref 6.4–8.3)

## 2016-01-02 LAB — CHCC SATELLITE - SMEAR

## 2016-01-02 LAB — LACTATE DEHYDROGENASE: LDH: 277 U/L — AB (ref 125–245)

## 2016-01-02 MED ORDER — IOHEXOL 300 MG/ML  SOLN
80.0000 mL | Freq: Once | INTRAMUSCULAR | Status: AC | PRN
  Administered 2016-01-02: 80 mL via INTRAVENOUS

## 2016-01-02 NOTE — Progress Notes (Signed)
Hematology and Oncology Follow Up Visit  WAYLAND NUDELMAN [id number] 1931/11/22 82 y.o. 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 Befort is here today with his wife for a follow-up. He states that after his last IVIG infusion he had a great deal of joint pain and felt like he had weight on his legs. He states that this makes him feel worse than he did before he started treatment. His CT scan today showed stable adenopathy within the mesentery and porta hepatis and along the aorta. He did have mild increase in the size of lymph nodes in the pelvis including his right inguinal node and perirectal nodes. His pneumonia has resolved. He still has a cough with white sputum at times. No fever, chills, n/v, rash, dizziness, SOB, chest pain, palpitations, abdominal pain or changes in bowel of bladder habits.  He has maintained a healthy appetite and continues to stay well hydrated. His weight is up 5 pounds since his last visit. No swelling or tenderness in his extremities at this time. The mild numbness and tingling in his hands and feet is unchanged. He states that this is not a new issue and that he has had it for years.  He continues to stay busy outside since the weather has been so nice working in the yard and tinkering in his garage.  Medications:    Medication List       This list is accurate as of: 01/02/16 12:21 PM.  Always use your most recent med list.               allopurinol 100 MG tablet  Commonly known as:  ZYLOPRIM  Take 100 mg by mouth daily as needed (for flareup).     amLODipine 5 MG tablet  Commonly known as:  NORVASC  Take 1 tablet (5 mg total) by mouth daily.     aspirin EC 81 MG tablet  Take 81 mg by mouth daily.     azithromycin 500 MG tablet  Commonly known as:  ZITHROMAX    Take 1 tablet (500 mg total) by mouth daily.     calcium carbonate 500 MG chewable tablet  Commonly known as:  TUMS - dosed in mg elemental calcium  Chew 1 tablet by mouth at bedtime.     cefdinir 300 MG capsule  Commonly known as:  OMNICEF  Take 1 capsule (300 mg total) by mouth 2 (two) times daily.     clopidogrel 75 MG tablet  Commonly known as:  PLAVIX  TAKE ONE TABLET DAILY WITH BREAKFAST     diphenhydrAMINE 25 MG tablet  Commonly known as:  BENADRYL  Take 25 mg by mouth at bedtime.     diphenhydrAMINE 50 MG tablet  Commonly known as:  BENADRYL  One tablet one hour before scan     enalapril 10 MG tablet  Commonly known as:  VASOTEC  Take 1 tablet (10 mg total) by mouth daily.     Fish Oil 1200 MG Caps  Take 1,200 mg by mouth 2 (two) times daily.     metoprolol 50 MG tablet  Commonly known as:  LOPRESSOR  Take 0.5 tablets (25 mg total) by mouth 2 (two) times daily.     nitroGLYCERIN 0.4 MG SL tablet  Commonly known as:  NITROSTAT  Place 1  tablet (0.4 mg total) under the tongue every 5 (five) minutes as needed for chest pain.     predniSONE 50 MG tablet  Commonly known as:  DELTASONE  One tablet 13hours, 7hours & 1hour before scan     Red Yeast Rice 600 MG Tabs  Take 1,200 mg by mouth at bedtime.     tamsulosin 0.4 MG Caps capsule  Commonly known as:  FLOMAX  Take 0.4 mg by mouth daily as needed (for urination).     triamcinolone ointment 0.1 %  Commonly known as:  KENALOG  Apply 1 application topically 2 (two) times daily as needed (dermatitis).        Allergies:  Allergies  Allergen Reactions  . Uloric [Febuxostat] Other (See Comments)    Pt states "it locked me up, I couldn't move from the waist up" Upper body paralysis-temporary  . Ivp Dye [Iodinated Diagnostic Agents] Other (See Comments)    Patient continued sneezing   . Statins Other (See Comments)    Patient unable to walk    Past Medical History, Surgical history, Social history, and  Family History were reviewed and updated.  Review of Systems: All other 10 point review of systems is negative.   Physical Exam:  height is 5\' 8"  (1.727 m) and weight is 189 lb (85.73 kg). His oral temperature is 97.5 F (36.4 C). His blood pressure is 144/49 and his pulse is 71. His respiration is 14.   Wt Readings from Last 3 Encounters:  01/02/16 189 lb (85.73 kg)  12/01/15 184 lb (83.462 kg)  10/21/15 188 lb (85.276 kg)    Ocular: Sclerae unicteric, pupils equal, round and reactive to light Ear-nose-throat: Oropharynx clear, dentition fair Lymphatic: No cervical supraclavicular or axillary adenopathy Lungs no rales or rhonchi, good excursion bilaterally Heart regular rate and rhythm, no murmur appreciated Abd soft, nontender, positive bowel sounds, no liver or spleen tip palpated on exam MSK no focal spinal tenderness, no joint edema Neuro: non-focal, well-oriented, appropriate affect Breasts: Deferred  Lab Results  Component Value Date   WBC 20.6* 01/02/2016   HGB 11.8* 01/02/2016   HCT 37.3* 01/02/2016   MCV 97 01/02/2016   PLT 235 01/02/2016   No results found for: FERRITIN, IRON, TIBC, UIBC, IRONPCTSAT Lab Results  Component Value Date   RBC 3.86* 01/02/2016   No results found for: Nils Pyle [facility] Lab Results  Component Value Date   IGGSERUM 614* 12/01/2015   IGA 17* 12/01/2015   IGMSERUM <5* 12/01/2015   Lab Results  Component Value Date   TOTALPROTELP 6.2 10/21/2015   ALBUMINELP 3.8 10/21/2015   A1GS 0.3 10/21/2015   A2GS 0.9 10/21/2015   BETS 0.4 10/21/2015   BETA2SER 0.2 10/21/2015   GAMS 0.3* 10/21/2015   MSPIKE Not Observed 12/26/2015   SPEI * 10/21/2015     Chemistry      Component Value Date/Time   NA 141 12/26/2015 1023   NA 140 12/01/2015 0958   NA 138 10/04/2015 0252   K 4.0 12/26/2015 1023   K 3.9 12/01/2015 0958   K 3.8 10/04/2015 0252   CL 101 12/01/2015 0958   CL 107 10/04/2015 0252   CO2 24 12/26/2015  1023   CO2 26 12/01/2015 0958   CO2 20* 10/04/2015 0252   BUN 21.5 12/26/2015 1023   BUN 30* 12/01/2015 0958   BUN 25* 10/04/2015 0252   CREATININE 1.6* 12/26/2015 1023   CREATININE 1.7* 12/01/2015 0958   CREATININE 1.68* 10/04/2015 0252  Component Value Date/Time   CALCIUM 9.2 12/26/2015 1023   CALCIUM 9.1 12/01/2015 0958   CALCIUM 8.2* 10/04/2015 0252   ALKPHOS 63 12/26/2015 1023   ALKPHOS 46 12/01/2015 0958   ALKPHOS 49 10/04/2015 0252   AST 13 12/26/2015 1023   AST 17 12/01/2015 0958   AST 15 10/04/2015 0252   ALT 15 12/26/2015 1023   ALT 17 12/01/2015 0958   ALT 12* 10/04/2015 0252   BILITOT 0.46 12/26/2015 1023   BILITOT 0.50 12/01/2015 0958   BILITOT 0.6 10/04/2015 0252     Impression and Plan: Mr. Cazenave is 82 year old gentleman with history of CLL and now hypogammaglobulinemia. His pneumonia has resolved and his white count today is 20.6. He still has a cough but this is improving.  His CT scan today showed stable adenopathy within the mesentery and porta hepatis and along the aorta. He had a mild increase in size of lymph nodes in the pelvis including the right inguinal node and perirectal node. We will plan a follow-up CT scan in 6 months to reevaluate unless he becomes symptomatic and we will perform the scan sooner. He states that he has a great deal of joint pain and feels that his legs have weight on him after he receives his IVIG infusion. He states that this makes him feel worse than he did before he started receiving these treatments. We will hold off on treating him with IVIG at this time since this is making him feel worse. We will continue to follow along with him and plan to see him back in 1 months for labs and follow-up.  He will contact us with any questions or concerns. We can certainly see him sooner if need be.   Eliezer Bottom, NP 2/6/201712:21 PM

## 2016-01-02 NOTE — Progress Notes (Signed)
IV started to draw labs. No treatment indicated after seeing NP.

## 2016-01-03 LAB — PROTEIN ELECTROPHORESIS, SERUM, WITH REFLEX
A/G Ratio: 1.3 (ref 0.7–1.7)
Albumin: 3.5 g/dL (ref 2.9–4.4)
Alpha 1: 0.2 g/dL (ref 0.0–0.4)
Alpha 2: 1 g/dL (ref 0.4–1.0)
Beta: 1 g/dL (ref 0.7–1.3)
GLOBULIN, TOTAL: 2.8 g/dL (ref 2.2–3.9)
Gamma Globulin: 0.6 g/dL (ref 0.4–1.8)
TOTAL PROTEIN: 6.3 g/dL (ref 6.0–8.5)

## 2016-01-03 LAB — IGG, IGA, IGM
IGA/IMMUNOGLOBULIN A, SERUM: 17 mg/dL — AB (ref 61–437)
IGG (IMMUNOGLOBIN G), SERUM: 658 mg/dL — AB (ref 700–1600)
IgM, Qn, Serum: 6 mg/dL — ABNORMAL LOW (ref 15–143)

## 2016-02-07 DIAGNOSIS — C07 Malignant neoplasm of parotid gland: Secondary | ICD-10-CM | POA: Insufficient documentation

## 2016-02-15 DIAGNOSIS — Q245 Malformation of coronary vessels: Secondary | ICD-10-CM | POA: Insufficient documentation

## 2016-02-15 DIAGNOSIS — I10 Essential (primary) hypertension: Secondary | ICD-10-CM | POA: Insufficient documentation

## 2016-02-15 DIAGNOSIS — I5030 Unspecified diastolic (congestive) heart failure: Secondary | ICD-10-CM | POA: Insufficient documentation

## 2016-02-15 DIAGNOSIS — N189 Chronic kidney disease, unspecified: Secondary | ICD-10-CM | POA: Insufficient documentation

## 2016-02-15 DIAGNOSIS — M069 Rheumatoid arthritis, unspecified: Secondary | ICD-10-CM | POA: Insufficient documentation

## 2016-02-19 ENCOUNTER — Emergency Department
Admission: EM | Admit: 2016-02-19 | Discharge: 2016-02-19 | Disposition: A | Discharge status: Home / Self Care | Payer: Medicare Other | Source: Home / Self Care | Attending: Emergency Medicine | Admitting: Emergency Medicine

## 2016-02-19 ENCOUNTER — Encounter: Payer: Self-pay | Admitting: Emergency Medicine

## 2016-02-19 DIAGNOSIS — J069 Acute upper respiratory infection, unspecified: Secondary | ICD-10-CM

## 2016-02-19 DIAGNOSIS — J209 Acute bronchitis, unspecified: Secondary | ICD-10-CM

## 2016-02-19 MED ORDER — AMOXICILLIN 875 MG PO TABS: ORAL_TABLET | ORAL | Status: DC

## 2016-02-19 NOTE — ED Provider Notes (Signed)
CSN: [id number]     Arrival date & time 02/19/16  1124 History   First MD Initiated Contact with Patient 02/19/16 1155     Chief Complaint  Patient presents with  . Cough  . Nasal Congestion    HPI   [name] [name] is a 82 y.o. male who complains of onset of cold symptoms for several days.Worsening sinus congestion discolored rhinorrhea, cough productive of discolored sputum. Some fever and chills last night.   Have been using over-the-counter treatment which helps a little bit.  Multiple medical problems, see past medical history below. He was hospitalized November 2016 for pneumonia which resolved after hospital treatment and outpatient antibiotics, including Azithromycin and Omnicef.  Current Therapy for Hypogammaglobulinemia   IVIG infusion monthly  Associated symptoms/review of systems No chills/sweats +  Fever  +  Nasal congestion +  Discolored Post-nasal drainage No sinus pain/pressure No sore throat  +  cough No wheezing Mild chest congestion No hemoptysis No shortness of breath No pleuritic pain  No itchy/red eyes No earache  No nausea No vomiting No abdominal pain No diarrhea  No skin rashes +  Fatigue No myalgias No headache   Past Medical History  Diagnosis Date  . HYPERLIPIDEMIA   . GOUT   . HYPERTENSION   . CAD     a. s/p CABG x 4 1988;  b. s/p redo CABG x 4 1996;  c. 04/2006 Cath/PCI: LM nl, LAD 100p, LCX 100, RCA 100, LIMA->LAD 40 anast, VG->LCX 95p (3.5x24 [facility] BMS), RIMA->RCA ok;  d. 12/2012 MV: smal area of ischemia in basal inferiolateral wall.  . TRANSIENT ISCHEMIC ATTACK   . NEPHROLITHIASIS   . CKD (chronic kidney disease), stage III   . CONTRAST DYE ALLERGY, HX OF   . HOH (hard of hearing)     a. uses hearing aids  . Cancer [facility]) 2013    bladder cancer, skin cancer  . Asthma   . Hypogammaglobulinemia ([place]) 10/21/2015  . CLL (chronic lymphocytic leukemia) ([place]) 10/21/2015   Past Surgical History  Procedure Laterality Date  .  Coronary artery bypass graft  1986  . Carotid endarterectomy    . Coronary artery bypass graft  1996  . Total knee arthroplasty  10/2009    rt  . Mass excision  05/27/2012    Procedure: EXCISION MASS;  Surgeon: Theodoro Kos, DO;  Location: [place];  Service: Plastics;  Laterality: N/A;  repair of upper lip defect pt is post op  excision of skin cancer with tissue rearrangement or lip switch.  . Left heart catheterization with coronary/graft angiogram N/A 09/01/2013    Procedure: LEFT HEART CATHETERIZATION WITH Beatrix Fetters;  Surgeon: Peter M Martinique, MD;  Location: [facility] CATH LAB;  Service: Cardiovascular;  Laterality: N/A;   Family History  Problem Relation Age of Onset  . Heart attack Father   . Heart disease Sister   . Stroke Brother    Social History  Substance Use Topics  . Smoking status: Former Smoker -- 1.00 packs/day for 13 years    Types: Cigarettes    Start date: 02/06/1953    Quit date: 11/26/1965  . Smokeless tobacco: Never Used     Comment: quit smoking 46 years ago  . Alcohol Use: No    Review of Systems  All other systems reviewed and are negative.   Allergies  Uloric; Ivp dye; and Statins  Home Medications   Prior to Admission medications   Medication Sig Start Date End Date Taking?  Authorizing Provider  allopurinol (ZYLOPRIM) 100 MG tablet Take 100 mg by mouth daily as needed (for flareup).     Historical Provider, MD  amLODipine (NORVASC) 5 MG tablet Take 1 tablet (5 mg total) by mouth daily. 07/27/15   Lelon Perla, MD  amoxicillin (AMOXIL) 875 MG tablet Take 1 twice a day X 10 days. 02/19/16   Jacqulyn Cane, MD  aspirin EC 81 MG tablet Take 81 mg by mouth daily.    Historical Provider, MD  calcium carbonate (TUMS - DOSED IN MG ELEMENTAL CALCIUM) 500 MG chewable tablet Chew 1 tablet by mouth at bedtime.     Historical Provider, MD  clopidogrel (PLAVIX) 75 MG tablet TAKE ONE TABLET DAILY WITH BREAKFAST 07/27/15   Lelon Perla,  MD  diphenhydrAMINE (BENADRYL) 25 MG tablet Take 25 mg by mouth at bedtime.     Historical Provider, MD  diphenhydrAMINE (BENADRYL) 50 MG tablet One tablet one hour before scan 12/30/15   [name], MD  enalapril (VASOTEC) 10 MG tablet Take 1 tablet (10 mg total) by mouth daily. 07/27/15   Lelon Perla, MD  metoprolol (LOPRESSOR) 50 MG tablet Take 0.5 tablets (25 mg total) by mouth 2 (two) times daily. 07/27/15   Lelon Perla, MD  nitroGLYCERIN (NITROSTAT) 0.4 MG SL tablet Place 1 tablet (0.4 mg total) under the tongue every 5 (five) minutes as needed for chest pain. 07/27/15   Lelon Perla, MD  Omega-3 Fatty Acids (FISH OIL) 1200 MG CAPS Take 1,200 mg by mouth 2 (two) times daily.     Historical Provider, MD  predniSONE (DELTASONE) 50 MG tablet One tablet 13hours, 7hours & 1hour before scan 12/30/15   [name], MD  Red Yeast Rice 600 MG TABS Take 1,200 mg by mouth at bedtime.     Historical Provider, MD  Tamsulosin HCl (FLOMAX) 0.4 MG CAPS Take 0.4 mg by mouth daily as needed (for urination).     Historical Provider, MD  triamcinolone ointment (KENALOG) 0.1 % Apply 1 application topically 2 (two) times daily as needed (dermatitis).    Historical Provider, MD   Meds Ordered and Administered this Visit  Medications - No data to display  BP 140/69 mmHg  Pulse 50  Temp(Src) 98 F (36.7 C) (Oral)  Resp 16  Ht 5\' 8"  (1.727 m)  Wt 185 lb (83.915 kg)  BMI 28.14 kg/m2  SpO2 95% No data found.   Physical Exam  Constitutional: He is oriented to person, place, and time. He appears well-developed and well-nourished. No distress.  HENT:  Head: Normocephalic and atraumatic.    Right Ear: Tympanic membrane normal.  Left Ear: Tympanic membrane normal.  Mouth/Throat: Oropharynx is clear and moist. No oropharyngeal exudate.  Nose: Boggy turbinates with mucoid drainage. Mild maxillary tenderness bilaterally.   Eyes: Right eye exhibits no discharge. Left eye exhibits no  discharge. No scleral icterus.  Neck: Neck supple.  Enlarged, nontender bilateral anterior cervical nodes. He states these enlarged lymph nodes are chronic related to his CLL.  Cardiovascular: Normal rate, regular rhythm and normal heart sounds.   Pulmonary/Chest: No respiratory distress. He has no wheezes. He has rhonchi. He has no rales.  Lymphadenopathy:    He has no cervical adenopathy.  Neurological: He is alert and oriented to person, place, and time.  Skin: Skin is warm and dry.  Nursing note and vitals reviewed.   ED Course  Procedures (including critical care time)  Labs Review Labs Reviewed -  No data to display  Imaging Review No results found.    MDM   1. Acute upper respiratory infection   2. Acute bronchitis, unspecified organism   Clinically, no signs of pneumonia. He declined any imaging today.  Treatment options discussed, as well as risks, benefits, alternatives. Antibiotic choices discussed. He states that amoxicillin always works for him and he absolutely refuses other options such as cephalosporin or Zithromax. Patient and wife voiced understanding and agreement with the following plans: Discharge Medication List as of 02/19/2016 12:27 PM    START taking these medications   Details  amoxicillin (AMOXIL) 875 MG tablet Take 1 twice a day X 10 days., Print       Other symptomatic care discussed. An After Visit Summary was printed and given to the patient. Follow-up with your primary care doctor in 5-7 days if not improving, or sooner if symptoms become worse. Precautions discussed. Red flags discussed. Questions invited and answered. Patient and wife voiced understanding and agreement.       Jacqulyn Cane, MD 02/19/16 1409

## 2016-02-19 NOTE — Discharge Instructions (Signed)
Acute Bronchitis Bronchitis is when the airways that extend from the windpipe into the lungs get red, puffy, and painful (inflamed). Bronchitis often causes thick spit (mucus) to develop. This leads to a cough. A cough is the most common symptom of bronchitis. In acute bronchitis, the condition usually begins suddenly and goes away over time (usually in 2 weeks). Smoking, allergies, and asthma can make bronchitis worse. Repeated episodes of bronchitis may cause more lung problems. HOME CARE  Rest.  Drink enough fluids to keep your pee (urine) clear or pale yellow (unless you need to limit fluids as told by your doctor).  Only take over-the-counter or prescription medicines as told by your doctor.  Avoid smoking and secondhand smoke. These can make bronchitis worse. If you are a smoker, think about using nicotine gum or skin patches. Quitting smoking will help your lungs heal faster.  Reduce the chance of getting bronchitis again by:  Washing your hands often.  Avoiding people with cold symptoms.  Trying not to touch your hands to your mouth, nose, or eyes.  Follow up with your doctor as told. GET HELP IF: Your symptoms do not improve after 1 week of treatment. Symptoms include:  Cough.  Fever.  Coughing up thick spit.  Body aches.  Chest congestion.  Chills.  Shortness of breath.  Sore throat. GET HELP RIGHT AWAY IF:   You have an increased fever.  You have chills.  You have severe shortness of breath.  You have bloody thick spit (sputum).  You throw up (vomit) often.  You lose too much body fluid (dehydration).  You have a severe headache.  You faint. MAKE SURE YOU:   Understand these instructions.  Will watch your condition.  Will get help right away if you are not doing well or get worse.   This information is not intended to replace advice given to you by your health care provider. Make sure you discuss any questions you have with your health care  provider.   Document Released: 04/30/2008 Document Revised: 07/15/2013 Document Reviewed: 05/05/2013 Elsevier Interactive Patient Education 2016 [employer]. Upper Respiratory Infection, Adult  A URI affects the nose, throat, and upper air passages. The most common type of URI is often called "the common cold." HOME CARE   Take medicines only as told by your doctor.  Gargle warm saltwater or take cough drops to comfort your throat as told by your doctor.  Use a warm mist humidifier or inhale steam from a shower to increase air moisture. This may make it easier to breathe.  Drink enough fluid to keep your pee (urine) clear or pale yellow.  Eat soups and other clear broths.  Have a healthy diet.  Rest as needed.  Go back to work when your fever is gone or your doctor says it is okay.  You may need to stay home longer to avoid giving your URI to others.  You can also wear a face mask and wash your hands often to prevent spread of the virus.  Use your inhaler more if you have asthma.  Do not use any tobacco products, including cigarettes, chewing tobacco, or electronic cigarettes. If you need help quitting, ask your doctor. GET HELP IF:  You are getting worse, not better.  Your symptoms are not helped by medicine.  You have chills.  You are getting more short of breath.  You have brown or red mucus.  You have yellow or brown discharge from your nose.  You have  pain in your face, especially when you bend forward.  You have a fever.  You have puffy (swollen) neck glands.  You have pain while swallowing.  You have white areas in the back of your throat. GET HELP RIGHT AWAY IF:   You have very bad or constant:  Headache.  Ear pain.  Pain in your forehead, behind your eyes, and over your cheekbones (sinus pain).  Chest pain.  You have long-lasting (chronic) lung disease and any of the following:  Wheezing.  Long-lasting cough.  Coughing up blood.  A  change in your usual mucus.  You have a stiff neck.  You have changes in your:  Vision.  Hearing.  Thinking.  Mood. MAKE SURE YOU:   Understand these instructions.  Will watch your condition.  Will get help right away if you are not doing well or get worse.   This information is not intended to replace advice given to you by your health care provider. Make sure you discuss any questions you have with your health care provider.   Document Released: 04/30/2008 Document Revised: 03/29/2015 Document Reviewed: 02/17/2014 Elsevier Interactive Patient Education [employer].

## 2016-02-19 NOTE — ED Notes (Signed)
Having surgery tomorrow and fears congestion and cough may prohibit.

## 2016-02-24 ENCOUNTER — Emergency Department (HOSPITAL_COMMUNITY)
Admission: EM | Admit: 2016-02-24 | Discharge: 2016-02-24 | Disposition: A | Discharge status: Home / Self Care | Payer: Medicare Other | Attending: Emergency Medicine | Admitting: Emergency Medicine

## 2016-02-24 ENCOUNTER — Encounter: Payer: Self-pay | Admitting: Emergency Medicine

## 2016-02-24 ENCOUNTER — Emergency Department
Admission: EM | Admit: 2016-02-24 | Discharge: 2016-02-24 | Disposition: A | Discharge status: Other Acute Inpatient Hospital | Payer: Medicare Other | Source: Home / Self Care | Attending: Family Medicine | Admitting: Family Medicine

## 2016-02-24 ENCOUNTER — Emergency Department (HOSPITAL_COMMUNITY): Payer: Medicare Other

## 2016-02-24 ENCOUNTER — Telehealth: Payer: Self-pay | Admitting: Cardiology

## 2016-02-24 ENCOUNTER — Encounter (HOSPITAL_COMMUNITY): Payer: Self-pay

## 2016-02-24 DIAGNOSIS — E785 Hyperlipidemia, unspecified: Secondary | ICD-10-CM | POA: Insufficient documentation

## 2016-02-24 DIAGNOSIS — Z8669 Personal history of other diseases of the nervous system and sense organs: Secondary | ICD-10-CM | POA: Insufficient documentation

## 2016-02-24 DIAGNOSIS — N183 Chronic kidney disease, stage 3 (moderate): Secondary | ICD-10-CM | POA: Insufficient documentation

## 2016-02-24 DIAGNOSIS — Z87442 Personal history of urinary calculi: Secondary | ICD-10-CM | POA: Diagnosis not present

## 2016-02-24 DIAGNOSIS — I129 Hypertensive chronic kidney disease with stage 1 through stage 4 chronic kidney disease, or unspecified chronic kidney disease: Secondary | ICD-10-CM | POA: Diagnosis not present

## 2016-02-24 DIAGNOSIS — Z87891 Personal history of nicotine dependence: Secondary | ICD-10-CM | POA: Insufficient documentation

## 2016-02-24 DIAGNOSIS — Z8673 Personal history of transient ischemic attack (TIA), and cerebral infarction without residual deficits: Secondary | ICD-10-CM | POA: Diagnosis not present

## 2016-02-24 DIAGNOSIS — Z8551 Personal history of malignant neoplasm of bladder: Secondary | ICD-10-CM | POA: Diagnosis not present

## 2016-02-24 DIAGNOSIS — Z79899 Other long term (current) drug therapy: Secondary | ICD-10-CM | POA: Diagnosis not present

## 2016-02-24 DIAGNOSIS — Z951 Presence of aortocoronary bypass graft: Secondary | ICD-10-CM | POA: Insufficient documentation

## 2016-02-24 DIAGNOSIS — I2510 Atherosclerotic heart disease of native coronary artery without angina pectoris: Secondary | ICD-10-CM | POA: Diagnosis not present

## 2016-02-24 DIAGNOSIS — Z856 Personal history of leukemia: Secondary | ICD-10-CM | POA: Insufficient documentation

## 2016-02-24 DIAGNOSIS — Z7902 Long term (current) use of antithrombotics/antiplatelets: Secondary | ICD-10-CM | POA: Diagnosis not present

## 2016-02-24 DIAGNOSIS — R0602 Shortness of breath: Secondary | ICD-10-CM | POA: Diagnosis present

## 2016-02-24 DIAGNOSIS — M109 Gout, unspecified: Secondary | ICD-10-CM | POA: Insufficient documentation

## 2016-02-24 DIAGNOSIS — J45901 Unspecified asthma with (acute) exacerbation: Secondary | ICD-10-CM | POA: Insufficient documentation

## 2016-02-24 DIAGNOSIS — Z7982 Long term (current) use of aspirin: Secondary | ICD-10-CM | POA: Insufficient documentation

## 2016-02-24 LAB — CBC WITH DIFFERENTIAL/PLATELET
BAND NEUTROPHILS: 0 %
BASOS ABS: 0.2 10*3/uL — AB (ref 0.0–0.1)
BLASTS: 0 %
Basophils Relative: 1 %
EOS ABS: 0.9 10*3/uL — AB (ref 0.0–0.7)
Eosinophils Relative: 5 %
HCT: 36.7 % — ABNORMAL LOW (ref 39.0–52.0)
HEMOGLOBIN: 11.9 g/dL — AB (ref 13.0–17.0)
Lymphocytes Relative: 76 %
Lymphs Abs: 13.7 10*3/uL — ABNORMAL HIGH (ref 0.7–4.0)
MCH: 30.4 pg (ref 26.0–34.0)
MCHC: 32.4 g/dL (ref 30.0–36.0)
MCV: 93.6 fL (ref 78.0–100.0)
METAMYELOCYTES PCT: 0 %
MYELOCYTES: 0 %
Monocytes Absolute: 0 10*3/uL — ABNORMAL LOW (ref 0.1–1.0)
Monocytes Relative: 0 %
Neutro Abs: 3.2 10*3/uL (ref 1.7–7.7)
Neutrophils Relative %: 18 %
Other: 0 %
PROMYELOCYTES ABS: 0 %
Platelets: 204 10*3/uL (ref 150–400)
RBC: 3.92 MIL/uL — ABNORMAL LOW (ref 4.22–5.81)
RDW: 15.8 % — ABNORMAL HIGH (ref 11.5–15.5)
WBC: 18 10*3/uL — ABNORMAL HIGH (ref 4.0–10.5)
nRBC: 0 /100 WBC

## 2016-02-24 LAB — BASIC METABOLIC PANEL
Anion gap: 12 (ref 5–15)
BUN: 33 mg/dL — ABNORMAL HIGH (ref 6–20)
CHLORIDE: 104 mmol/L (ref 101–111)
CO2: 23 mmol/L (ref 22–32)
CREATININE: 1.69 mg/dL — AB (ref 0.61–1.24)
Calcium: 9.6 mg/dL (ref 8.9–10.3)
GFR calc non Af Amer: 36 mL/min — ABNORMAL LOW (ref >60)
GFR, EST AFRICAN AMERICAN: 42 mL/min — AB (ref >60)
Glucose, Bld: 129 mg/dL — ABNORMAL HIGH (ref 65–99)
POTASSIUM: 4.8 mmol/L (ref 3.5–5.1)
SODIUM: 139 mmol/L (ref 135–145)

## 2016-02-24 LAB — I-STAT TROPONIN, ED: Troponin i, poc: 0.02 ng/mL (ref 0.00–0.08)

## 2016-02-24 LAB — BRAIN NATRIURETIC PEPTIDE: B Natriuretic Peptide: 278.9 pg/mL — ABNORMAL HIGH (ref 0.0–100.0)

## 2016-02-24 MED ORDER — IPRATROPIUM-ALBUTEROL 0.5-2.5 (3) MG/3ML IN SOLN
3.0000 mL | RESPIRATORY_TRACT | Status: AC
  Administered 2016-02-24 (×3): 3 mL via RESPIRATORY_TRACT
  Filled 2016-02-24: qty 9

## 2016-02-24 NOTE — Telephone Encounter (Signed)
Returned call to patient. Seen in urgent care for upper resp illness on Sunday. Pt reports little improvement, still having cough, congestion & shortness of breath. Advised return to urgent care if unable to see primary care physician.  ER if symptoms worse. Caller voiced understanding.

## 2016-02-24 NOTE — ED Notes (Signed)
Pt c/o SOB.Marland Kitchenstates no improvement since his visit on 02/19/16. Taking amox.

## 2016-02-24 NOTE — ED Notes (Signed)
Pt told last week he had URI.  Pt given antibiotics.  Went to [facility] urgent care today and told to come here.  Pt having shortness of breath with lying down and with ambulation.  Pt with active cough.

## 2016-02-24 NOTE — ED Provider Notes (Signed)
CSN: [id number]     Arrival date & time 02/24/16  1814 History   First MD Initiated Contact with Patient 02/24/16 2015     Chief Complaint  Patient presents with  . Shortness of Breath     (Consider location/radiation/quality/duration/timing/severity/associated sxs/prior Treatment) Patient is a 82 y.o. male presenting with shortness of breath. The history is provided by the patient.  Shortness of Breath Severity:  Mild Onset quality:  Gradual Duration:  2 days Timing:  Constant Progression:  Worsening Chronicity:  New Relieved by:  Nothing Worsened by:  Nothing tried (lying flat) Ineffective treatments:  None tried Associated symptoms: cough   Associated symptoms: no abdominal pain, no chest pain, no fever, no headaches, no rash and no vomiting    82 yo M With a chief complaint shortness of breath. This been going on for the past couple days.Preceded by URI. Patient was seen at an urgent care center about a week ago diagnosed presumptively with pneumonia started on amoxicillin and given steroids and breathing treatments for home.  Past Medical History  Diagnosis Date  . HYPERLIPIDEMIA   . GOUT   . HYPERTENSION   . CAD     a. s/p CABG x 4 1988;  b. s/p redo CABG x 4 1996;  c. 04/2006 Cath/PCI: LM nl, LAD 100p, LCX 100, RCA 100, LIMA->LAD 40 anast, VG->LCX 95p (3.5x24 [facility] BMS), RIMA->RCA ok;  d. 12/2012 MV: smal area of ischemia in basal inferiolateral wall.  . TRANSIENT ISCHEMIC ATTACK   . NEPHROLITHIASIS   . CKD (chronic kidney disease), stage III   . CONTRAST DYE ALLERGY, HX OF   . HOH (hard of hearing)     a. uses hearing aids  . Cancer [facility]) 2013    bladder cancer, skin cancer  . Asthma   . Hypogammaglobulinemia ([place]) 10/21/2015  . CLL (chronic lymphocytic leukemia) ([place]) 10/21/2015   Past Surgical History  Procedure Laterality Date  . Coronary artery bypass graft  1986  . Carotid endarterectomy    . Coronary artery bypass graft  1996  . Total knee arthroplasty   10/2009    rt  . Mass excision  05/27/2012    Procedure: EXCISION MASS;  Surgeon: Theodoro Kos, DO;  Location: [place];  Service: Plastics;  Laterality: N/A;  repair of upper lip defect pt is post op  excision of skin cancer with tissue rearrangement or lip switch.  . Left heart catheterization with coronary/graft angiogram N/A 09/01/2013    Procedure: LEFT HEART CATHETERIZATION WITH Beatrix Fetters;  Surgeon: Peter M Martinique, MD;  Location: [facility] CATH LAB;  Service: Cardiovascular;  Laterality: N/A;   Family History  Problem Relation Age of Onset  . Heart attack Father   . Heart disease Sister   . Stroke Brother    Social History  Substance Use Topics  . Smoking status: Former Smoker -- 1.00 packs/day for 13 years    Types: Cigarettes    Start date: 02/06/1953    Quit date: 11/26/1965  . Smokeless tobacco: Never Used     Comment: quit smoking 46 years ago  . Alcohol Use: No    Review of Systems  Constitutional: Negative for fever and chills.  HENT: Positive for congestion. Negative for facial swelling.   Eyes: Negative for discharge and visual disturbance.  Respiratory: Positive for cough and shortness of breath.   Cardiovascular: Negative for chest pain and palpitations.  Gastrointestinal: Negative for vomiting, abdominal pain and diarrhea.  Musculoskeletal: Negative for myalgias  and arthralgias.  Skin: Negative for color change and rash.  Neurological: Negative for tremors, syncope and headaches.  Psychiatric/Behavioral: Negative for confusion and dysphoric mood.      Allergies  Uloric; Ivp dye; and Statins  Home Medications   Prior to Admission medications   Medication Sig Start Date End Date Taking? Authorizing Provider  allopurinol (ZYLOPRIM) 100 MG tablet Take 100 mg by mouth daily as needed (for flareup).    Yes Historical Provider, MD  amLODipine (NORVASC) 5 MG tablet Take 1 tablet (5 mg total) by mouth daily. 07/27/15  Yes Lelon Perla,  MD  amoxicillin (AMOXIL) 875 MG tablet Take 1 twice a day X 10 days. Patient taking differently: Take 875 mg by mouth 2 (two) times daily. Start 3/26 for ten days 02/19/16  Yes Jacqulyn Cane, MD  aspirin EC 81 MG tablet Take 81 mg by mouth daily.   Yes Historical Provider, MD  calcium carbonate (TUMS - DOSED IN MG ELEMENTAL CALCIUM) 500 MG chewable tablet Chew 1 tablet by mouth at bedtime.    Yes Historical Provider, MD  clopidogrel (PLAVIX) 75 MG tablet TAKE ONE TABLET DAILY WITH BREAKFAST Patient taking differently: Take 75 mg by mouth daily.  07/27/15  Yes Lelon Perla, MD  diphenhydrAMINE (BENADRYL) 25 MG tablet Take 25 mg by mouth at bedtime.    Yes Historical Provider, MD  enalapril (VASOTEC) 10 MG tablet Take 1 tablet (10 mg total) by mouth daily. 07/27/15  Yes Lelon Perla, MD  metoprolol (LOPRESSOR) 50 MG tablet Take 0.5 tablets (25 mg total) by mouth 2 (two) times daily. 07/27/15  Yes Lelon Perla, MD  nitroGLYCERIN (NITROSTAT) 0.4 MG SL tablet Place 1 tablet (0.4 mg total) under the tongue every 5 (five) minutes as needed for chest pain. 07/27/15  Yes Lelon Perla, MD  Omega-3 Fatty Acids (FISH OIL) 1200 MG CAPS Take 1,200 mg by mouth 2 (two) times daily.    Yes Historical Provider, MD  Red Yeast Rice 600 MG TABS Take 1,200 mg by mouth at bedtime.    Yes Historical Provider, MD  Tamsulosin HCl (FLOMAX) 0.4 MG CAPS Take 0.4 mg by mouth daily as needed (for urination).    Yes Historical Provider, MD  triamcinolone ointment (KENALOG) 0.1 % Apply 1 application topically 2 (two) times daily as needed (dermatitis).   Yes Historical Provider, MD  diphenhydrAMINE (BENADRYL) 50 MG tablet One tablet one hour before scan Patient not taking: Reported on 02/24/2016 12/30/15   [name], MD  predniSONE (DELTASONE) 50 MG tablet One tablet 13hours, 7hours & 1hour before scan Patient not taking: Reported on 02/24/2016 12/30/15   [name], MD   BP 96/75 mmHg  Pulse 74  Temp(Src) 97.8  F (36.6 C) (Oral)  Resp 19  SpO2 92% Physical Exam  Constitutional: He is oriented to person, place, and time. He appears well-developed and well-nourished.  HENT:  Head: Normocephalic and atraumatic.  Eyes: EOM are normal. Pupils are equal, round, and reactive to light.  Neck: Normal range of motion. Neck supple. No JVD present.  Cardiovascular: Normal rate and regular rhythm.  Exam reveals no gallop and no friction rub.   No murmur heard. Pulmonary/Chest: No respiratory distress. He has wheezes (diffuse, likely upper airway). He has no rales.  Abdominal: He exhibits no distension. There is no tenderness. There is no rebound and no guarding.  Musculoskeletal: Normal range of motion.  Neurological: He is alert and oriented to person, place, and  time.  Skin: No rash noted. No pallor.  Psychiatric: He has a normal mood and affect. His behavior is normal.  Nursing note and vitals reviewed.   ED Course  Procedures (including critical care time) Labs Review Labs Reviewed  CBC WITH DIFFERENTIAL/PLATELET - Abnormal; Notable for the following:    WBC 18.0 (*)    RBC 3.92 (*)    Hemoglobin 11.9 (*)    HCT 36.7 (*)    RDW 15.8 (*)    Lymphs Abs 13.7 (*)    Monocytes Absolute 0.0 (*)    Eosinophils Absolute 0.9 (*)    Basophils Absolute 0.2 (*)    All other components within normal limits  BASIC METABOLIC PANEL - Abnormal; Notable for the following:    Glucose, Bld 129 (*)    BUN 33 (*)    Creatinine, Ser 1.69 (*)    GFR calc non Af Amer 36 (*)    GFR calc Af Amer 42 (*)    All other components within normal limits  BRAIN NATRIURETIC PEPTIDE - Abnormal; Notable for the following:    B Natriuretic Peptide 278.9 (*)    All other components within normal limits  PATHOLOGIST SMEAR REVIEW  Randolm Idol, ED    Imaging Review Dg Chest 2 View  02/24/2016  CLINICAL DATA:  Cough. Upper respiratory infection. Bladder cancer. Leukemia. Ex-smoker. EXAM: CHEST  2 VIEW COMPARISON:   01/02/2016 chest CT. Most recent radiograph of 10/21/2015. FINDINGS: Prior median sternotomy. Midline trachea. Normal heart size. No pleural effusion or pneumothorax. Clear lungs. IMPRESSION: No acute cardiopulmonary disease. Electronically Signed   By: Abigail Miyamoto M.D.   On: 02/24/2016 19:04   I have personally reviewed and evaluated these images and lab results as part of my medical decision-making.   EKG Interpretation   Date/Time:  Friday February 24 2016 19:29:20 EDT Ventricular Rate:  77 PR Interval:  144 QRS Duration: 127 QT Interval:  413 QTC Calculation: 467 R Axis:   45 Text Interpretation:  Sinus rhythm Paired ventricular premature complexes  Baseline wander TECHNICALLY DIFFICULT Otherwise no significant change  Confirmed by Jacquelyne Quarry MD, Quillian Quince [id number]) on 02/24/2016 8:19:39 PM      MDM   Final diagnoses:  SOB (shortness of breath)    82 yo M with a cc of sob worse when lying flat.  Has hx of CHF, normally cant lie flat.  CXR without significant pulm edema, lungs clear.  BNP at baseline.  Feel that patient symptoms are likely secondary to his URI with significant post nasal drip.  Will trial antihistamine, PCP follow up.  11:19 PM:  I have discussed the diagnosis/risks/treatment options with the patient and family and believe the pt to be eligible for discharge home to follow-up with PCP. We also discussed returning to the ED immediately if new or worsening sx occur. We discussed the sx which are most concerning (e.g., sudden worsening pain, fever, inability to tolerate by mouth) that necessitate immediate return. Medications administered to the patient during their visit and any new prescriptions provided to the patient are listed below.  Medications given during this visit Medications  ipratropium-albuterol (DUONEB) 0.5-2.5 (3) MG/3ML nebulizer solution 3 mL (3 mLs Nebulization Given 02/24/16 2110)    Discharge Medication List as of 02/24/2016  9:46 PM      The patient  appears reasonably screen and/or stabilized for discharge and I doubt any other medical condition or other [facility] requiring further screening, evaluation, or treatment in the ED at this time  prior to discharge.      Deno Etienne, DO 02/24/16 2319

## 2016-02-24 NOTE — Discharge Instructions (Signed)

## 2016-02-24 NOTE — Telephone Encounter (Signed)
[name] [name] is calling because this morning [name] [name] is having these spells of Shortness of breath , and having a hard time breathing  And wants to come n to see someone today . Please call

## 2016-02-26 ENCOUNTER — Telehealth: Payer: Self-pay | Admitting: Emergency Medicine

## 2016-02-27 LAB — PATHOLOGIST SMEAR REVIEW

## 2016-02-28 ENCOUNTER — Emergency Department (HOSPITAL_COMMUNITY): Payer: Medicare Other

## 2016-02-28 ENCOUNTER — Encounter (HOSPITAL_COMMUNITY): Payer: Self-pay | Admitting: Emergency Medicine

## 2016-02-28 ENCOUNTER — Telehealth: Payer: Self-pay | Admitting: Cardiology

## 2016-02-28 ENCOUNTER — Emergency Department (HOSPITAL_COMMUNITY)
Admission: EM | Admit: 2016-02-28 | Discharge: 2016-02-28 | Disposition: A | Discharge status: Home / Self Care | Payer: Medicare Other | Attending: Emergency Medicine | Admitting: Emergency Medicine

## 2016-02-28 ENCOUNTER — Other Ambulatory Visit: Payer: Self-pay

## 2016-02-28 DIAGNOSIS — J069 Acute upper respiratory infection, unspecified: Secondary | ICD-10-CM | POA: Diagnosis not present

## 2016-02-28 DIAGNOSIS — I2510 Atherosclerotic heart disease of native coronary artery without angina pectoris: Secondary | ICD-10-CM | POA: Insufficient documentation

## 2016-02-28 DIAGNOSIS — I129 Hypertensive chronic kidney disease with stage 1 through stage 4 chronic kidney disease, or unspecified chronic kidney disease: Secondary | ICD-10-CM | POA: Diagnosis not present

## 2016-02-28 DIAGNOSIS — Z7902 Long term (current) use of antithrombotics/antiplatelets: Secondary | ICD-10-CM | POA: Diagnosis not present

## 2016-02-28 DIAGNOSIS — Z8673 Personal history of transient ischemic attack (TIA), and cerebral infarction without residual deficits: Secondary | ICD-10-CM | POA: Insufficient documentation

## 2016-02-28 DIAGNOSIS — Z9889 Other specified postprocedural states: Secondary | ICD-10-CM | POA: Insufficient documentation

## 2016-02-28 DIAGNOSIS — Z8551 Personal history of malignant neoplasm of bladder: Secondary | ICD-10-CM | POA: Insufficient documentation

## 2016-02-28 DIAGNOSIS — Z85828 Personal history of other malignant neoplasm of skin: Secondary | ICD-10-CM | POA: Diagnosis not present

## 2016-02-28 DIAGNOSIS — Z862 Personal history of diseases of the blood and blood-forming organs and certain disorders involving the immune mechanism: Secondary | ICD-10-CM | POA: Diagnosis not present

## 2016-02-28 DIAGNOSIS — Z79899 Other long term (current) drug therapy: Secondary | ICD-10-CM | POA: Insufficient documentation

## 2016-02-28 DIAGNOSIS — N183 Chronic kidney disease, stage 3 (moderate): Secondary | ICD-10-CM | POA: Diagnosis not present

## 2016-02-28 DIAGNOSIS — Z87891 Personal history of nicotine dependence: Secondary | ICD-10-CM | POA: Diagnosis not present

## 2016-02-28 DIAGNOSIS — R0602 Shortness of breath: Secondary | ICD-10-CM | POA: Diagnosis present

## 2016-02-28 DIAGNOSIS — J45901 Unspecified asthma with (acute) exacerbation: Secondary | ICD-10-CM | POA: Insufficient documentation

## 2016-02-28 DIAGNOSIS — R0789 Other chest pain: Secondary | ICD-10-CM | POA: Diagnosis not present

## 2016-02-28 DIAGNOSIS — Z951 Presence of aortocoronary bypass graft: Secondary | ICD-10-CM | POA: Insufficient documentation

## 2016-02-28 DIAGNOSIS — Z7982 Long term (current) use of aspirin: Secondary | ICD-10-CM | POA: Insufficient documentation

## 2016-02-28 DIAGNOSIS — H9190 Unspecified hearing loss, unspecified ear: Secondary | ICD-10-CM | POA: Diagnosis not present

## 2016-02-28 DIAGNOSIS — M109 Gout, unspecified: Secondary | ICD-10-CM | POA: Insufficient documentation

## 2016-02-28 LAB — BASIC METABOLIC PANEL WITH GFR
Anion gap: 11 (ref 5–15)
BUN: 31 mg/dL — ABNORMAL HIGH (ref 6–20)
CO2: 23 mmol/L (ref 22–32)
Calcium: 10.7 mg/dL — ABNORMAL HIGH (ref 8.9–10.3)
Chloride: 102 mmol/L (ref 101–111)
Creatinine, Ser: 1.57 mg/dL — ABNORMAL HIGH (ref 0.61–1.24)
GFR calc Af Amer: 46 mL/min — ABNORMAL LOW
GFR calc non Af Amer: 39 mL/min — ABNORMAL LOW
Glucose, Bld: 121 mg/dL — ABNORMAL HIGH (ref 65–99)
Potassium: 4.3 mmol/L (ref 3.5–5.1)
Sodium: 136 mmol/L (ref 135–145)

## 2016-02-28 LAB — CBC
HCT: 37.3 % — ABNORMAL LOW (ref 39.0–52.0)
Hemoglobin: 12.2 g/dL — ABNORMAL LOW (ref 13.0–17.0)
MCH: 30.4 pg (ref 26.0–34.0)
MCHC: 32.7 g/dL (ref 30.0–36.0)
MCV: 93 fL (ref 78.0–100.0)
Platelets: 208 K/uL (ref 150–400)
RBC: 4.01 MIL/uL — ABNORMAL LOW (ref 4.22–5.81)
RDW: 15.3 % (ref 11.5–15.5)
WBC: 24.7 K/uL — ABNORMAL HIGH (ref 4.0–10.5)

## 2016-02-28 LAB — I-STAT TROPONIN, ED
TROPONIN I, POC: 0.03 ng/mL (ref 0.00–0.08)
Troponin i, poc: 0.03 ng/mL (ref 0.00–0.08)

## 2016-02-28 LAB — BRAIN NATRIURETIC PEPTIDE: B Natriuretic Peptide: 238.2 pg/mL — ABNORMAL HIGH (ref 0.0–100.0)

## 2016-02-28 MED ORDER — IPRATROPIUM-ALBUTEROL 0.5-2.5 (3) MG/3ML IN SOLN
3.0000 mL | Freq: Once | RESPIRATORY_TRACT | Status: AC
  Administered 2016-02-28: 3 mL via RESPIRATORY_TRACT
  Filled 2016-02-28: qty 3

## 2016-02-28 MED ORDER — ALBUTEROL SULFATE HFA 108 (90 BASE) MCG/ACT IN AERS
2.0000 | INHALATION_SPRAY | Freq: Once | RESPIRATORY_TRACT | Status: AC
  Administered 2016-02-28: 2 via RESPIRATORY_TRACT
  Filled 2016-02-28: qty 6.7

## 2016-02-28 MED ORDER — PREDNISONE 20 MG PO TABS: 40.0000 mg | ORAL_TABLET | Freq: Every day | ORAL | Status: DC

## 2016-02-28 MED ORDER — PREDNISONE 20 MG PO TABS
40.0000 mg | ORAL_TABLET | Freq: Once | ORAL | Status: AC
  Administered 2016-02-28: 40 mg via ORAL
  Filled 2016-02-28: qty 2

## 2016-02-28 NOTE — Telephone Encounter (Signed)
Received call from patient he stated he has been up all night with sob.Stated no chest pain.Stated he took NTG x 1 last night with a little relief.Stated he continues to have severe sob hard to walk across room.Advised to call 911 and go to ER.Stated he will have a friend to drive him.

## 2016-02-28 NOTE — ED Notes (Signed)
PA at bedside.

## 2016-02-28 NOTE — Telephone Encounter (Signed)
Pt c/o Shortness Of Breath: STAT if SOB developed within the last 24 hours or pt is noticeably SOB on the phone  1. Are you currently SOB (can you hear that pt is SOB on the phone)? yes  2. How long have you been experiencing SOB? 2 days   3. Are you SOB when sitting or when up moving around? Both   4. Are you currently experiencing any other symptoms? Little CP, took nitro last night 4/3

## 2016-02-28 NOTE — Discharge Instructions (Signed)
1. Medications: prednisone, albuterol inhaler, usual home medications 2. Treatment: rest, drink plenty of fluids 3. Follow Up: please followup with your primary doctor in 2-3 days for discussion of your diagnoses and further evaluation after today's visit; if you do not have a primary care doctor use the phone number listed in your discharge paperwork to find one; please return to the ER for increased shortness of breath, severe chest pain, new or worsening symptoms   Upper Respiratory Infection, Adult Most upper respiratory infections (URIs) are a viral infection of the air passages leading to the lungs. A URI affects the nose, throat, and upper air passages. The most common type of URI is nasopharyngitis and is typically referred to as "the common cold." URIs run their course and usually go away on their own. Most of the time, a URI does not require medical attention, but sometimes a bacterial infection in the upper airways can follow a viral infection. This is called a secondary infection. Sinus and middle ear infections are common types of secondary upper respiratory infections. Bacterial pneumonia can also complicate a URI. A URI can worsen asthma and chronic obstructive pulmonary disease (COPD). Sometimes, these complications can require emergency medical care and may be life threatening.  CAUSES Almost all URIs are caused by viruses. A virus is a type of germ and can spread from one person to another.  RISKS FACTORS You may be at risk for a URI if:   You smoke.   You have chronic heart or lung disease.  You have a weakened defense (immune) system.   You are very young or very old.   You have nasal allergies or asthma.  You work in crowded or poorly ventilated areas.  You work in health care facilities or schools. SIGNS AND SYMPTOMS  Symptoms typically develop 2-3 days after you come in contact with a cold virus. Most viral URIs last 7-10 days. However, viral URIs from the  influenza virus (flu virus) can last 14-18 days and are typically more severe. Symptoms may include:   Runny or stuffy (congested) nose.   Sneezing.   Cough.   Sore throat.   Headache.   Fatigue.   Fever.   Loss of appetite.   Pain in your forehead, behind your eyes, and over your cheekbones (sinus pain).  Muscle aches.  DIAGNOSIS  Your health care provider may diagnose a URI by:  Physical exam.  Tests to check that your symptoms are not due to another condition such as:  Strep throat.  Sinusitis.  Pneumonia.  Asthma. TREATMENT  A URI goes away on its own with time. It cannot be cured with medicines, but medicines may be prescribed or recommended to relieve symptoms. Medicines may help:  Reduce your fever.  Reduce your cough.  Relieve nasal congestion. HOME CARE INSTRUCTIONS   Take medicines only as directed by your health care provider.   Gargle warm saltwater or take cough drops to comfort your throat as directed by your health care provider.  Use a warm mist humidifier or inhale steam from a shower to increase air moisture. This may make it easier to breathe.  Drink enough fluid to keep your urine clear or pale yellow.   Eat soups and other clear broths and maintain good nutrition.   Rest as needed.   Return to work when your temperature has returned to normal or as your health care provider advises. You may need to stay home longer to avoid infecting others. You can also  use a face mask and careful hand washing to prevent spread of the virus.  Increase the usage of your inhaler if you have asthma.   Do not use any tobacco products, including cigarettes, chewing tobacco, or electronic cigarettes. If you need help quitting, ask your health care provider. PREVENTION  The best way to protect yourself from getting a cold is to practice good hygiene.   Avoid oral or hand contact with people with cold symptoms.   Wash your hands often if  contact occurs.  There is no clear evidence that vitamin C, vitamin E, echinacea, or exercise reduces the chance of developing a cold. However, it is always recommended to get plenty of rest, exercise, and practice good nutrition.  SEEK MEDICAL CARE IF:   You are getting worse rather than better.   Your symptoms are not controlled by medicine.   You have chills.  You have worsening shortness of breath.  You have brown or red mucus.  You have yellow or brown nasal discharge.  You have pain in your face, especially when you bend forward.  You have a fever.  You have swollen neck glands.  You have pain while swallowing.  You have white areas in the back of your throat. SEEK IMMEDIATE MEDICAL CARE IF:   You have severe or persistent:  Headache.  Ear pain.  Sinus pain.  Chest pain.  You have chronic lung disease and any of the following:  Wheezing.  Prolonged cough.  Coughing up blood.  A change in your usual mucus.  You have a stiff neck.  You have changes in your:  Vision.  Hearing.  Thinking.  Mood. MAKE SURE YOU:   Understand these instructions.  Will watch your condition.  Will get help right away if you are not doing well or get worse.   This information is not intended to replace advice given to you by your health care provider. Make sure you discuss any questions you have with your health care provider.   Document Released: 05/08/2001 Document Revised: 03/29/2015 Document Reviewed: 02/17/2014 Elsevier Interactive Patient Education [employer].

## 2016-02-28 NOTE — ED Notes (Signed)
Pt sts SOB starting last night worse when laying flat; pt sts mild swelling in legs; pt sts some CP and took nitro at home

## 2016-02-28 NOTE — Telephone Encounter (Signed)
Agree [name] [name]  

## 2016-02-28 NOTE — ED Provider Notes (Signed)
CSN: [id number]     Arrival date & time 02/28/16  1203 History   First MD Initiated Contact with Patient 02/28/16 1511     Chief Complaint  Patient presents with  . Shortness of Breath    HPI   [name] [name] is a 82 y.o. male with a PMH of HLD, HTN, CAD, CKD, CLL who presents to the ED with worsening shortness of breath x 3-4 days. He states he recently was diagnosed with a URI and started on amoxicillin, though has experienced increased shortness of breath over the past several days. He states exertion exacerbates his symptoms and that he has had to sleep sitting up due to shortness of breath with lying flat. He notes he took nitro this morning with some symptom relief. He notes associated cough productive of white sputum and chest tightness. He denies fever, chills, abdominal pain, N/V, numbness, weakness, lower extremity edema.   Past Medical History  Diagnosis Date  . HYPERLIPIDEMIA   . GOUT   . HYPERTENSION   . CAD     a. s/p CABG x 4 1988;  b. s/p redo CABG x 4 1996;  c. 04/2006 Cath/PCI: LM nl, LAD 100p, LCX 100, RCA 100, LIMA->LAD 40 anast, VG->LCX 95p (3.5x24 [employer] BMS), RIMA->RCA ok;  d. 12/2012 MV: smal area of ischemia in basal inferiolateral wall.  . TRANSIENT ISCHEMIC ATTACK   . NEPHROLITHIASIS   . CKD (chronic kidney disease), stage III   . CONTRAST DYE ALLERGY, HX OF   . HOH (hard of hearing)     a. uses hearing aids  . Cancer [facility]) 2013    bladder cancer, skin cancer  . Asthma   . Hypogammaglobulinemia ([place]) 10/21/2015  . CLL (chronic lymphocytic leukemia) ([place]) 10/21/2015   Past Surgical History  Procedure Laterality Date  . Coronary artery bypass graft  1986  . Carotid endarterectomy    . Coronary artery bypass graft  1996  . Total knee arthroplasty  10/2009    rt  . Mass excision  05/27/2012    Procedure: EXCISION MASS;  Surgeon: Theodoro Kos, DO;  Location: [place];  Service: Plastics;  Laterality: N/A;  repair of upper lip defect pt is  post op  excision of skin cancer with tissue rearrangement or lip switch.  . Left heart catheterization with coronary/graft angiogram N/A 09/01/2013    Procedure: LEFT HEART CATHETERIZATION WITH Beatrix Fetters;  Surgeon: Peter M Martinique, MD;  Location: [facility] CATH LAB;  Service: Cardiovascular;  Laterality: N/A;   Family History  Problem Relation Age of Onset  . Heart attack Father   . Heart disease Sister   . Stroke Brother    Social History  Substance Use Topics  . Smoking status: Former Smoker -- 1.00 packs/day for 13 years    Types: Cigarettes    Start date: 02/06/1953    Quit date: 11/26/1965  . Smokeless tobacco: Never Used     Comment: quit smoking 46 years ago  . Alcohol Use: No      Review of Systems  Constitutional: Negative for fever and chills.  Respiratory: Positive for cough, chest tightness and shortness of breath.   Cardiovascular: Negative for leg swelling.  Gastrointestinal: Negative for nausea, vomiting and abdominal pain.  Neurological: Negative for syncope, weakness and numbness.  All other systems reviewed and are negative.     Allergies  Uloric; Ivp dye; and Statins  Home Medications   Prior to Admission medications   Medication Sig Start  Date End Date Taking? Authorizing Provider  allopurinol (ZYLOPRIM) 100 MG tablet Take 100 mg by mouth daily as needed (for flareup).    Yes Historical Provider, MD  amLODipine (NORVASC) 5 MG tablet Take 1 tablet (5 mg total) by mouth daily. 07/27/15  Yes Lelon Perla, MD  aspirin EC 81 MG tablet Take 81 mg by mouth daily.   Yes Historical Provider, MD  calcium carbonate (TUMS - DOSED IN MG ELEMENTAL CALCIUM) 500 MG chewable tablet Chew 1 tablet by mouth at bedtime.    Yes Historical Provider, MD  clopidogrel (PLAVIX) 75 MG tablet TAKE ONE TABLET DAILY WITH BREAKFAST Patient taking differently: Take 75 mg by mouth daily.  07/27/15  Yes Lelon Perla, MD  diphenhydrAMINE (BENADRYL) 25 MG tablet Take 25 mg  by mouth at bedtime.    Yes Historical Provider, MD  enalapril (VASOTEC) 10 MG tablet Take 1 tablet (10 mg total) by mouth daily. 07/27/15  Yes Lelon Perla, MD  metoprolol (LOPRESSOR) 50 MG tablet Take 0.5 tablets (25 mg total) by mouth 2 (two) times daily. 07/27/15  Yes Lelon Perla, MD  nitroGLYCERIN (NITROSTAT) 0.4 MG SL tablet Place 1 tablet (0.4 mg total) under the tongue every 5 (five) minutes as needed for chest pain. 07/27/15  Yes Lelon Perla, MD  Omega-3 Fatty Acids (FISH OIL) 1200 MG CAPS Take 1,200 mg by mouth 2 (two) times daily.    Yes Historical Provider, MD  Red Yeast Rice 600 MG TABS Take 1,200 mg by mouth at bedtime.    Yes Historical Provider, MD  Tamsulosin HCl (FLOMAX) 0.4 MG CAPS Take 0.4 mg by mouth daily as needed (for urination).    Yes Historical Provider, MD  triamcinolone ointment (KENALOG) 0.1 % Apply 1 application topically 2 (two) times daily as needed (dermatitis).   Yes Historical Provider, MD  predniSONE (DELTASONE) 20 MG tablet Take 2 tablets (40 mg total) by mouth daily. 02/28/16   Guadelupe Sabin Smith Mcnicholas, PA-C    BP 135/70 mmHg  Pulse 89  Temp(Src) 97.5 F (36.4 C) (Oral)  Resp 17  Ht 5\' 8"  (1.727 m)  Wt 81.103 kg  BMI 27.19 kg/m2  SpO2 95% Physical Exam  Constitutional: He is oriented to person, place, and time. He appears well-developed and well-nourished. No distress.  HENT:  Head: Normocephalic and atraumatic.  Right Ear: External ear normal.  Left Ear: External ear normal.  Nose: Nose normal.  Mouth/Throat: Uvula is midline, oropharynx is clear and moist and mucous membranes are normal.  Eyes: Conjunctivae, EOM and lids are normal. Pupils are equal, round, and reactive to light. Right eye exhibits no discharge. Left eye exhibits no discharge. No scleral icterus.  Neck: Normal range of motion. Neck supple.  Cardiovascular: Normal rate, regular rhythm, normal heart sounds, intact distal pulses and normal pulses.   Pulmonary/Chest: Effort  normal. No respiratory distress. He has wheezes. He has no rales.  End expiratory wheezes bilaterally. No increased work of breathing. No respiratory distress.  Abdominal: Soft. Normal appearance and bowel sounds are normal. He exhibits no distension and no mass. There is no tenderness. There is no rigidity, no rebound and no guarding.  Musculoskeletal: Normal range of motion. He exhibits no edema or tenderness.  Neurological: He is alert and oriented to person, place, and time. He has normal strength. No cranial nerve deficit or sensory deficit.  Skin: Skin is warm, dry and intact. No rash noted. He is not diaphoretic. No erythema. No pallor.  Psychiatric: He has a normal mood and affect. His speech is normal and behavior is normal.  Nursing note and vitals reviewed.   ED Course  Procedures (including critical care time)  Labs Review Labs Reviewed  BASIC METABOLIC PANEL - Abnormal; Notable for the following:    Glucose, Bld 121 (*)    BUN 31 (*)    Creatinine, Ser 1.57 (*)    Calcium 10.7 (*)    GFR calc non Af Amer 39 (*)    GFR calc Af Amer 46 (*)    All other components within normal limits  CBC - Abnormal; Notable for the following:    WBC 24.7 (*)    RBC 4.01 (*)    Hemoglobin 12.2 (*)    HCT 37.3 (*)    All other components within normal limits  BRAIN NATRIURETIC PEPTIDE - Abnormal; Notable for the following:    B Natriuretic Peptide 238.2 (*)    All other components within normal limits  I-STAT TROPOININ, ED  Randolm Idol, ED    Imaging Review Dg Chest 2 View  02/28/2016  CLINICAL DATA:  Shortness of breath and chest pain for 5 days EXAM: CHEST  2 VIEW COMPARISON:  February 24, 2016 FINDINGS: There is no edema or consolidation. Heart size and pulmonary vascularity are normal. No adenopathy. Patient is status post internal mammary bypass grafting. There is degenerative change in the thoracic spine. There is atherosclerotic calcification in the aorta. There is degenerative  change in each shoulder. IMPRESSION: No edema or consolidation. Electronically Signed   By: Lowella Grip III M.D.   On: 02/28/2016 13:06   I have personally reviewed and evaluated these images and lab results as part of my medical decision-making.   EKG Interpretation None      MDM   Final diagnoses:  URI (upper respiratory infection)    82 year old male presents with shortness of breath, worsening x 3-4 days. Also notes cough, chest tightness, and orthopnea. Denies fever, chills, abdominal pain, N/V, numbness, weakness, lower extremity edema.   Patient is afebrile. Vital signs stable. Heart RRR. Lungs with end expiratory wheezing bilaterally. No increased work of breathing or respiratory distress. Abdomen soft, non-tender, non-distended. No lower extremity edema.   Patient given breathing treatment.  EKG sinus rhythm with PVCs, heart 70. Troponin negative x 2. CXR no edema or consolidation. CBC remarkable for leukocytosis of 24.7, which appears chronic (likely due to CLL), hemoglobin 12.2. BMP remarkable for creatinine 1.57, which appears stable from baseline. BNP 238, which appears chronic.  Patient discussed with and seen by Dr. Johnney Killian. Patient given additional breathing treatment and steroid dose. Symptoms likely due to viral URI. Patient is non-toxic and well-appearing, feel he is stable for discharge at this time. Will give albuterol inhaler and short steroid course for home. Patient to follow-up with PCP. Strict return precautions discussed. Patient and family members verbalize their understanding and are in agreement with plan.  BP 135/70 mmHg  Pulse 89  Temp(Src) 97.5 F (36.4 C) (Oral)  Resp 17  Ht 5\' 8"  (1.727 m)  Wt 81.103 kg  BMI 27.19 kg/m2  SpO2 95%     Marella Chimes, PA-C 02/29/16 0034  Charlesetta Shanks, MD 02/29/16 0130

## 2016-03-02 ENCOUNTER — Other Ambulatory Visit (HOSPITAL_BASED_OUTPATIENT_CLINIC_OR_DEPARTMENT_OTHER): Payer: Medicare Other

## 2016-03-02 ENCOUNTER — Ambulatory Visit (HOSPITAL_BASED_OUTPATIENT_CLINIC_OR_DEPARTMENT_OTHER): Payer: Medicare Other | Admitting: Hematology & Oncology

## 2016-03-02 ENCOUNTER — Encounter (HOSPITAL_BASED_OUTPATIENT_CLINIC_OR_DEPARTMENT_OTHER): Payer: Medicare Other | Admitting: Nurse Practitioner

## 2016-03-02 ENCOUNTER — Encounter: Payer: Self-pay | Admitting: Hematology & Oncology

## 2016-03-02 VITALS — BP 176/77 | HR 79 | Temp 97.5°F | Resp 18 | Ht 68.0 in | Wt 176.0 lb

## 2016-03-02 DIAGNOSIS — D801 Nonfamilial hypogammaglobulinemia: Secondary | ICD-10-CM

## 2016-03-02 DIAGNOSIS — R062 Wheezing: Secondary | ICD-10-CM

## 2016-03-02 DIAGNOSIS — C9110 Chronic lymphocytic leukemia of B-cell type not having achieved remission: Secondary | ICD-10-CM

## 2016-03-02 DIAGNOSIS — J441 Chronic obstructive pulmonary disease with (acute) exacerbation: Secondary | ICD-10-CM

## 2016-03-02 LAB — COMPREHENSIVE METABOLIC PANEL
ALK PHOS: 73 U/L (ref 40–150)
ALT: 10 U/L (ref 0–55)
AST: 13 U/L (ref 5–34)
Albumin: 3.7 g/dL (ref 3.5–5.0)
Anion Gap: 12 mEq/L — ABNORMAL HIGH (ref 3–11)
BUN: 39.1 mg/dL — AB (ref 7.0–26.0)
CHLORIDE: 104 meq/L (ref 98–109)
CO2: 22 mEq/L (ref 22–29)
Calcium: 9.9 mg/dL (ref 8.4–10.4)
Creatinine: 1.7 mg/dL — ABNORMAL HIGH (ref 0.7–1.3)
EGFR: 37 mL/min/{1.73_m2} — AB (ref >90)
GLUCOSE: 188 mg/dL — AB (ref 70–140)
POTASSIUM: 4.4 meq/L (ref 3.5–5.1)
SODIUM: 137 meq/L (ref 136–145)
Total Bilirubin: <0.3 mg/dL (ref 0.20–1.20)
Total Protein: 6.9 g/dL (ref 6.4–8.3)

## 2016-03-02 LAB — CBC WITH DIFFERENTIAL (CANCER CENTER ONLY)
HCT: 38.4 % — ABNORMAL LOW (ref 38.7–49.9)
HGB: 12.6 g/dL — ABNORMAL LOW (ref 13.0–17.1)
MCH: 30.9 pg (ref 28.0–33.4)
MCHC: 32.8 g/dL (ref 32.0–35.9)
MCV: 94 fL (ref 82–98)
PLATELETS: 274 10*3/uL (ref 145–400)
RBC: 4.08 10*6/uL — AB (ref 4.20–5.70)
RDW: 15.4 % (ref 11.1–15.7)
WBC: 33.4 10*3/uL — AB (ref 4.0–10.0)

## 2016-03-02 LAB — CHCC SATELLITE - SMEAR

## 2016-03-02 LAB — MANUAL DIFFERENTIAL (CHCC SATELLITE)
ALC: 26 10*3/uL — AB (ref 0.9–3.3)
ANC (CHCC HP manual diff): 6 10*3/uL (ref 1.5–6.5)
BAND NEUTROPHILS: 2 % (ref 0–10)
EOS: 1 % (ref 0–7)
LYMPH: 64 % — ABNORMAL HIGH (ref 14–48)
MONO: 3 % (ref 0–13)
PLT EST ~~LOC~~: ADEQUATE
RBC Comments: NORMAL
SEG: 16 % — AB (ref 40–75)
Variant Lymph: 14 % — ABNORMAL HIGH (ref 0–0)

## 2016-03-02 LAB — LACTATE DEHYDROGENASE: LDH: 274 U/L — AB (ref 125–245)

## 2016-03-02 MED ORDER — IPRATROPIUM-ALBUTEROL 0.5-2.5 (3) MG/3ML IN SOLN: 3.0000 mL | Freq: Four times a day (QID) | RESPIRATORY_TRACT | Status: DC

## 2016-03-02 MED ORDER — IBRUTINIB 140 MG PO CAPS
280.0000 mg | ORAL_CAPSULE | Freq: Every day | ORAL | Status: DC
Start: 2016-03-02 — End: 2016-03-21

## 2016-03-02 MED ORDER — IPRATROPIUM BROMIDE 0.02 % IN SOLN
RESPIRATORY_TRACT | Status: AC
  Filled 2016-03-02: qty 2.5

## 2016-03-02 MED ORDER — IPRATROPIUM BROMIDE 0.02 % IN SOLN
0.5000 mg | Freq: Once | RESPIRATORY_TRACT | Status: AC
  Administered 2016-03-02: 0.5 mg via RESPIRATORY_TRACT

## 2016-03-02 MED ORDER — ALBUTEROL SULFATE (2.5 MG/3ML) 0.083% IN NEBU
2.5000 mg | INHALATION_SOLUTION | Freq: Once | RESPIRATORY_TRACT | Status: AC
  Administered 2016-03-02: 2.5 mg via RESPIRATORY_TRACT
  Filled 2016-03-02: qty 3

## 2016-03-02 NOTE — Patient Instructions (Signed)
Albuterol; Ipratropium inhalation aerosol What is this medicine? ALBUTEROL; IPRATROPIUM (al BYOO ter ole; i pra TROE pee um) has two bronchodilators. It helps open up the airways in your lungs to make it easier to breathe.This medicine is used to treat chronic obstructive pulmonary disease (COPD). This medicine may be used for other purposes; ask your health care provider or pharmacist if you have questions. What should I tell my health care provider before I take this medicine? They need to know if you have any of the following conditions: -heart disease -high blood pressure -irregular heartbeat -an unusual or allergic reaction to albuterol, ipratropium, atropine, soya protein, soybeans or peanuts, other medicines, foods, dyes, or preservatives -pregnant or trying to get pregnant -breast-feeding How should I use this medicine? This medicine is for inhalation only. Follow the instructions on your prescription label. Do not use more often than directed. Make sure that you are using your inhaler correctly. Ask you doctor or health care provider if you have any questions. Talk to your pediatrician regarding the use of this medicine in children. Special care may be needed. Overdosage: If you think you have taken too much of this medicine contact a poison control center or emergency room at once. NOTE: This medicine is only for you. Do not share this medicine with others. What if I miss a dose? If you miss a dose, use it as soon as you can. If it is almost time for your next dose, use only that dose. Do not use double or extra doses. What may interact with this medicine? Do not take this medicine with any of the following medications: -MAOIs like Carbex, Eldepryl, Marplan, Nardil, and Parnate This medicine may also interact with the following medications: -diuretics -medicines for depression, anxiety, or psychotic disturbances -medicines for irregular heartbeat -medicines for weight loss  including some herbal products -methadone -pimozide -sertindole -some medicines for blood pressure or the heart This list may not describe all possible interactions. Give your health care provider a list of all the medicines, herbs, non-prescription drugs, or dietary supplements you use. Also tell them if you smoke, drink alcohol, or use illegal drugs. Some items may interact with your medicine. What should I watch for while using this medicine? Tell your doctor or health care professional if your symptoms do not improve. If your breathing gets worse while you are using this medicine, call your doctor right away. Do not stop using your medicine unless your doctor tells you to. Your mouth may get dry. Chewing sugarless gum or sucking hard candy, and drinking plenty of water may help. Contact your doctor if the problem does not go away or is severe. You may get dizzy or have blurred vision. Do not drive, use machinery, or do anything that needs mental alertness until you know how this medicine affects you. Do not stand or sit up quickly, especially if you are an older patient. This reduces the risk of dizzy or fainting spells. What side effects may I notice from receiving this medicine? Side effects that you should report to your doctor or health care professional as soon as possible: -allergic reactions like skin rash, itching or hives, swelling of the face, lips, or tongue -breathing problems -feeling faint or lightheaded, falls -fever -high blood pressure -irregular heartbeat or chest pain -muscle cramps or weakness -pain, tingling, numbness in the hands or feet -vomiting Side effects that usually do not require medical attention (report to your doctor or health care professional if they continue or  are bothersome): -blurred vision -cough -difficulty passing urine -difficulty sleeping -headache -nervousness or trembling -stuffy or runny nose -unusual taste -upset stomach This list may  not describe all possible side effects. Call your doctor for medical advice about side effects. You may report side effects to FDA at 1-800-FDA-1088. Where should I keep my medicine? Keep out of the reach of children. Store at a room temperature between 15 and 30 degrees C (59 and 86 degrees F). Do not freeze. This medicine does not work as well if it is too cold. Protect from humidity. Never throw the container into a fire or incinerator. Keep track of the number of sprays used and discard after 200 sprays. Throw away any unused medicine after the expiration date. NOTE: This sheet is a summary. It may not cover all possible information. If you have questions about this medicine, talk to your doctor, pharmacist, or health care provider.    2016, Elsevier/[employer] Standard. (2013-04-24 08:58:39)

## 2016-03-02 NOTE — Progress Notes (Signed)
Hematology and Oncology Follow Up Visit  [name] [name] [id number] [birth year] 82 y.o. 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 Given family has been having problems with skin cancer. He's been seen by dermatology. He's been seen by plastic surgery. He is told that this skin cancer is because of his "chemotherapy" that he gets for his CLL. I told him that he has never had chemotherapy for CLL. We are giving him actually should help with the skin cancer as it helps try to increase his immune system.  I told him that the CLL by itself can increase his risk of skin cancer. Then again, he might have skin cancer is because of his skin tone, past history of being on the sun and multiple sunburns.  I will give him the benefit of the doubt and told him that we can treat the CLL with oral agents that or not chemotherapy and this might help with the skin cancers.  If we treated his CLL, I would use Imbruvica. I think this would be reasonable. I clearly would not use full dose on him. I would set off at half dose and see how he tolerates this.  I told him that we have to see about getting him the medication at a minimal cost to him. I'm no that he would not be overdue afford that much.   I did tell him that the IVIG that we haven't given him really is a good thing area and I think this would definitely decrease his risk of infections. He has been in the hospital and emergency room because of respiratory issues. His last IgG level was 658 mg/dL. This is a little on the low side but not as it was back in November.   His appetite is okay. He's had no nausea or vomiting.   There's not been any bleeding.   He did have acute bronchitis and was almost admitted to the  hospital for this.   Overall, his performance status is ECOG 2.   Medications:    Medication List       This list is accurate as of: 03/02/16  4:26 PM.  Always use your most recent med list.               allopurinol 100 MG tablet  Commonly known as:  ZYLOPRIM  Take 100 mg by mouth daily as needed (for flareup).     amLODipine 5 MG tablet  Commonly known as:  NORVASC  Take 1 tablet (5 mg total) by mouth daily.     aspirin EC 81 MG tablet  Take 81 mg by mouth daily.     calcium carbonate 500 MG chewable tablet  Commonly known as:  TUMS - dosed in mg elemental calcium  Chew 1 tablet by mouth at bedtime.     clopidogrel 75 MG tablet  Commonly known as:  PLAVIX  TAKE ONE TABLET DAILY WITH BREAKFAST     diphenhydrAMINE 25 MG tablet  Commonly known as:  BENADRYL  Take 25 mg by mouth at bedtime.     enalapril 10 MG tablet  Commonly known as:  VASOTEC  Take 1 tablet (10 mg total) by mouth daily.     Fish Oil 1200 MG Caps  Take  1,200 mg by mouth 2 (two) times daily.     ibrutinib 140 MG capsul  Commonly known as:  IMBRUVICA  Take 2 capsules (280 mg total) by mouth daily.     metoprolol 50 MG tablet  Commonly known as:  LOPRESSOR  Take 0.5 tablets (25 mg total) by mouth 2 (two) times daily.     nitroGLYCERIN 0.4 MG SL tablet  Commonly known as:  NITROSTAT  Place 1 tablet (0.4 mg total) under the tongue every 5 (five) minutes as needed for chest pain.     predniSONE 20 MG tablet  Commonly known as:  DELTASONE  Take 2 tablets (40 mg total) by mouth daily.     Red Yeast Rice 600 MG Tabs  Take 1,200 mg by mouth at bedtime.     tamsulosin 0.4 MG Caps capsule  Commonly known as:  FLOMAX  Take 0.4 mg by mouth daily as needed (for urination).     triamcinolone ointment 0.1 %  Commonly known as:  KENALOG  Apply 1 application topically 2 (two) times daily as needed (dermatitis).        Allergies:  Allergies  Allergen Reactions  . Uloric [Febuxostat] Other (See  Comments)    Pt states "it locked me up, I couldn't move from the waist up" Upper body paralysis-temporary  . Ivp Dye [Iodinated Diagnostic Agents] Other (See Comments)    Patient continued sneezing   . Statins Other (See Comments)    Patient unable to walk    Past Medical History, Surgical history, Social history, and Family History were reviewed and updated.  Review of Systems: All other 10 point review of systems is negative.   Physical Exam:  height is 5\' 8"  (1.727 m) and weight is 176 lb (79.833 kg). His oral temperature is 97.5 F (36.4 C). His blood pressure is 176/77 and his pulse is 79. His respiration is 18.   Wt Readings from Last 3 Encounters:  03/02/16 176 lb (79.833 kg)  02/28/16 178 lb 12.8 oz (81.103 kg)  02/19/16 185 lb (83.915 kg)    Elderly white male in no obvious distress. Head and neck exam shows the surgical excision scars. He has a large one on the left side of his face. This is healing in. He has a small one on the right side of his face. This also is healing. He has no obvious adenopathy in the neck. Thyroid is nonpalpable. He has no intraoral lesions. His lungs show some wheezes and crackles bilaterally. He has decent air movement. Cardiac exam regular rate and rhythm with no murmurs, rubs or bruits. Abdomen is soft. He has no fluid wave. There is no palpable liver or spleen tip. Back exam shows no tenderness over the spine, ribs or hips. Extremities shows no clubbing, cyanosis or edema. Skin exam does show numerous actinic keratoses and solar keratoses. If I has some basal cell and squamous cell carcinomas on his skin. Neurological exam shows no focal neurological deficits.  Lab Results  Component Value Date   WBC 33.4* 03/02/2016   HGB 12.6* 03/02/2016   HCT 38.4* 03/02/2016   MCV 94 03/02/2016   PLT 274 03/02/2016   No results found for: FERRITIN, IRON, TIBC, UIBC, IRONPCTSAT Lab Results  Component Value Date   RBC 4.08* 03/02/2016   No results  found for: [facility] Lab Results  Component Value Date   IGGSERUM 658* 01/02/2016   IGA 17* 12/01/2015   IGMSERUM 6* 01/02/2016   Lab Results  Component Value Date   TOTALPROTELP 6.2 10/21/2015   ALBUMINELP 3.8 10/21/2015   A1GS 0.3 10/21/2015   A2GS 0.9 10/21/2015   BETS 0.4 10/21/2015   BETA2SER 0.2 10/21/2015   GAMS 0.3* 10/21/2015   MSPIKE Not Observed 01/02/2016   SPEI * 10/21/2015     Chemistry      Component Value Date/Time   NA 137 03/02/2016 1143   NA 136 02/28/2016 1221   NA 140 12/01/2015 0958   K 4.4 03/02/2016 1143   K 4.3 02/28/2016 1221   K 3.9 12/01/2015 0958   CL 102 02/28/2016 1221   CL 101 12/01/2015 0958   CO2 22 03/02/2016 1143   CO2 23 02/28/2016 1221   CO2 26 12/01/2015 0958   BUN 39.1* 03/02/2016 1143   BUN 31* 02/28/2016 1221   BUN 30* 12/01/2015 0958   CREATININE 1.7* 03/02/2016 1143   CREATININE 1.57* 02/28/2016 1221   CREATININE 1.7* 12/01/2015 0958      Component Value Date/Time   CALCIUM 9.9 03/02/2016 1143   CALCIUM 10.7* 02/28/2016 1221   CALCIUM 9.1 12/01/2015 0958   ALKPHOS 73 03/02/2016 1143   ALKPHOS 46 12/01/2015 0958   ALKPHOS 49 10/04/2015 0252   AST 13 03/02/2016 1143   AST 17 12/01/2015 0958   AST 15 10/04/2015 0252   ALT 10 03/02/2016 1143   ALT 17 12/01/2015 0958   ALT 12* 10/04/2015 0252   BILITOT <0.30 03/02/2016 1143   BILITOT 0.50 12/01/2015 0958   BILITOT 0.6 10/04/2015 0252     Impression and Plan: Mr. Moskwa is 82 year old gentleman with history of CLL.  He has hypogammaglobulinemia from the CLL. This is causing him to have multiple bouts of infection, particularly pneumonia. He has been hospitalized.  I think the IVIG that we have given him has helped. His immunoglobulin levels are a little bit better.  He does have some respiratory issues right now. I think he would benefit from a nebulizer therapy. We will go ahead and give him one in the office. I date with his wheezing,  nebulizer would help.  I will work on getting him the [place]. Again, we will have him dosed at 280 mg a day. He really will not take this if he has to pay any significant amount.  I would like to get him back within a month. We will see about giving him IVIG only see him back.  I spent about 45 minutes was he and his family. He had a lot of issues going on that I needed to spend time with   [name], MD 4/7/20174:26 PM

## 2016-03-02 NOTE — Progress Notes (Signed)
Erroneous note from infusion

## 2016-03-03 LAB — IGG, IGA, IGM
IGA/IMMUNOGLOBULIN A, SERUM: 19 mg/dL — AB (ref 61–437)
IGM (IMMUNOGLOBIN M), SRM: 5 mg/dL — AB (ref 15–143)
IgG, Qn, Serum: 452 mg/dL — ABNORMAL LOW (ref 700–1600)

## 2016-03-05 ENCOUNTER — Telehealth: Payer: Self-pay | Admitting: Nurse Practitioner

## 2016-03-05 NOTE — Telephone Encounter (Signed)
Imbruvica approved through 11/25/16. [place] [id number] from [employer] under Medicare Pt D

## 2016-03-06 LAB — PROTEIN ELECTROPHORESIS, SERUM, WITH REFLEX
A/G RATIO SPE: 1.3 (ref 0.7–1.7)
Albumin: 3.5 g/dL (ref 2.9–4.4)
Alpha 1: 0.2 g/dL (ref 0.0–0.4)
Alpha 2: 1.1 g/dL — ABNORMAL HIGH (ref 0.4–1.0)
BETA: 1 g/dL (ref 0.7–1.3)
GAMMA GLOBULIN: 0.4 g/dL (ref 0.4–1.8)
Globulin, Total: 2.8 g/dL (ref 2.2–3.9)
TOTAL PROTEIN: 6.3 g/dL (ref 6.0–8.5)

## 2016-03-21 ENCOUNTER — Ambulatory Visit (INDEPENDENT_AMBULATORY_CARE_PROVIDER_SITE_OTHER): Payer: Medicare Other | Admitting: Cardiology

## 2016-03-21 ENCOUNTER — Encounter: Payer: Self-pay | Admitting: Cardiology

## 2016-03-21 VITALS — BP 108/58 | HR 81 | Ht 68.0 in | Wt 154.0 lb

## 2016-03-21 DIAGNOSIS — I2510 Atherosclerotic heart disease of native coronary artery without angina pectoris: Secondary | ICD-10-CM | POA: Diagnosis not present

## 2016-03-21 DIAGNOSIS — I10 Essential (primary) hypertension: Secondary | ICD-10-CM | POA: Diagnosis not present

## 2016-03-21 DIAGNOSIS — I679 Cerebrovascular disease, unspecified: Secondary | ICD-10-CM

## 2016-03-21 MED ORDER — ASPIRIN EC 81 MG PO TBEC: 81.0000 mg | DELAYED_RELEASE_TABLET | Freq: Every day | ORAL | Status: AC

## 2016-03-21 NOTE — Patient Instructions (Addendum)
Your physician recommends that you schedule a follow-up appointment in: 3 MONTHS WITH DR [place]

## 2016-03-21 NOTE — Assessment & Plan Note (Signed)
Blood pressure controlled. Continue present medications. 

## 2016-03-21 NOTE — Progress Notes (Signed)
HPI: FU coronary artery disease. He has a history of myocardial infarction and coronary artery bypass grafting in 1986 and he had a redo coronary artery bypass grafting in 1996. Note he had an abdominal ultrasound in June of 2008 that showed no aneurysm. Admitted in October of 2014 with chest pain and ruled in. Cardiac catheterization in October 2014 showed a normal left main, occluded LAD, circumflex and right coronary artery. The sequential saphenous vein graft to the first and second obtuse marginals had a 90% in-stent restenosis followed by a second 90% lesion. The free radial graft to the right coronary artery was patent. LIMA to LAD was patent. Patient had PCI of the saphenous vein graft to his marginals with a drug-eluting stent. Echocardiogram in October of 2014 showed normal LV function, grade 1 diastolic dysfunction, mild left atrial enlargement and trace mitral regurgitation. He has also had a history of a carotid endarterectomy and his most recent carotid Doppler performed in Dec 2016 showed 60-79 bilateral stenosis. Followup recommended in 12 months. Patient seen with dyspnea and chest tightness in the emergency room in April. Treated with bronchodilators with improvement. Since I last saw him, He denies dyspnea, chest pain, palpitations or syncope. He had surgery for cancer in his left facial area and has residual wounds from that. He has been unable to eat causing significant weakness.  Current Outpatient Prescriptions  Medication Sig Dispense Refill  . allopurinol (ZYLOPRIM) 100 MG tablet Take 100 mg by mouth daily as needed (for flareup).     Marland Kitchen amLODipine (NORVASC) 5 MG tablet Take 1 tablet (5 mg total) by mouth daily. 90 tablet 3  . calcium carbonate (TUMS - DOSED IN MG ELEMENTAL CALCIUM) 500 MG chewable tablet Chew 1 tablet by mouth at bedtime.     . clopidogrel (PLAVIX) 75 MG tablet TAKE ONE TABLET DAILY WITH BREAKFAST (Patient taking differently: Take 37.5 mg by mouth daily. )  90 tablet 3  . diphenhydrAMINE (BENADRYL) 25 MG tablet Take 25 mg by mouth at bedtime.     . enalapril (VASOTEC) 10 MG tablet Take 1 tablet (10 mg total) by mouth daily. 90 tablet 3  . metoprolol (LOPRESSOR) 50 MG tablet Take 0.5 tablets (25 mg total) by mouth 2 (two) times daily. 180 tablet 3  . nitroGLYCERIN (NITROSTAT) 0.4 MG SL tablet Place 1 tablet (0.4 mg total) under the tongue every 5 (five) minutes as needed for chest pain. 25 tablet 3  . predniSONE (DELTASONE) 20 MG tablet Take 2 tablets (40 mg total) by mouth daily. (Patient taking differently: Take 40 mg by mouth as needed. ) 10 tablet 0  . Tamsulosin HCl (FLOMAX) 0.4 MG CAPS Take 0.4 mg by mouth daily as needed (for urination). Reported on 03/21/2016    . triamcinolone ointment (KENALOG) 0.1 % Apply 1 application topically 2 (two) times daily as needed (dermatitis). Reported on 03/21/2016     No current facility-administered medications for this visit.     Past Medical History  Diagnosis Date  . HYPERLIPIDEMIA   . GOUT   . HYPERTENSION   . CAD     a. s/p CABG x 4 1988;  b. s/p redo CABG x 4 1996;  c. 04/2006 Cath/PCI: LM nl, LAD 100p, LCX 100, RCA 100, LIMA->LAD 40 anast, VG->LCX 95p (3.5x24 [employer] BMS), RIMA->RCA ok;  d. 12/2012 MV: smal area of ischemia in basal inferiolateral wall.  . TRANSIENT ISCHEMIC ATTACK   . NEPHROLITHIASIS   . CKD (chronic kidney  disease), stage III   . CONTRAST DYE ALLERGY, HX OF   . HOH (hard of hearing)     a. uses hearing aids  . Cancer [facility]) 2013    bladder cancer, skin cancer  . Asthma   . Hypogammaglobulinemia ([place]) 10/21/2015  . CLL (chronic lymphocytic leukemia) ([place]) 10/21/2015    Past Surgical History  Procedure Laterality Date  . Coronary artery bypass graft  1986  . Carotid endarterectomy    . Coronary artery bypass graft  1996  . Total knee arthroplasty  10/2009    rt  . Mass excision  05/27/2012    Procedure: EXCISION MASS;  Surgeon: Theodoro Kos, DO;  Location: [place];  Service: Plastics;  Laterality: N/A;  repair of upper lip defect pt is post op  excision of skin cancer with tissue rearrangement or lip switch.  . Left heart catheterization with coronary/graft angiogram N/A 09/01/2013    Procedure: LEFT HEART CATHETERIZATION WITH Beatrix Fetters;  Surgeon: Peter M Martinique, MD;  Location: [facility] C[facility] LAB;  Service: Cardiovascular;  Laterality: N/A;    Social History   Social History  . Marital Status: Married    Spouse Name: N/A  . Number of Children: N/A  . Years of Education: N/A   Occupational History  . Not on file.   Social History Main Topics  . Smoking status: Former Smoker -- 1.00 packs/day for 13 years    Types: Cigarettes    Start date: 02/06/1953    Quit date: 11/26/1965  . Smokeless tobacco: Never Used     Comment: quit smoking 46 years ago  . Alcohol Use: No  . Drug Use: No  . Sexual Activity: Not on file   Other Topics Concern  . Not on file   Social History Narrative    Family History  Problem Relation Age of Onset  . Heart attack Father   . Heart disease Sister   . Stroke Brother     ROS:  Weakness from recent surgery but no fevers or chills, productive cough, hemoptysis, dysphasia, odynophagia, melena, hematochezia, dysuria, hematuria, rash, seizure activity, orthopnea, PND, pedal edema, claudication. Remaining systems are negative.  Physical Exam: Well-developed well-nourished in no acute distress.  Skin is warm and dry.  HEENT With bandage in place from recent surgery. He has left facial droop. Neck is supple.  Chest is clear to auscultation with normal expansion.  Cardiovascular exam is regular rate and rhythm.  Abdominal exam nontender or distended. No masses palpated. Extremities show no edema. neuro grossly intact

## 2016-03-21 NOTE — Assessment & Plan Note (Addendum)
Continue aspirin. I will discontinue his Plavix as he has had problems with bleeding from his face from recent surgery. Last stent was in 2014. Intolerant to statins.

## 2016-03-21 NOTE — Assessment & Plan Note (Signed)
Follow-up carotid Dopplers December 2017. 

## 2016-03-21 NOTE — Assessment & Plan Note (Signed)
Continue diet. Intolerant to statins. 

## 2016-03-30 ENCOUNTER — Other Ambulatory Visit: Payer: Medicare Other

## 2016-03-30 ENCOUNTER — Ambulatory Visit: Payer: Medicare Other | Admitting: Hematology & Oncology

## 2016-03-30 ENCOUNTER — Ambulatory Visit: Payer: Medicare Other

## 2016-04-26 ENCOUNTER — Emergency Department (HOSPITAL_COMMUNITY)
Admission: EM | Admit: 2016-04-26 | Discharge: 2016-04-26 | Disposition: A | Discharge status: Home / Self Care | Payer: Medicare Other | Attending: Emergency Medicine | Admitting: Emergency Medicine

## 2016-04-26 ENCOUNTER — Encounter (HOSPITAL_COMMUNITY): Payer: Self-pay | Admitting: Emergency Medicine

## 2016-04-26 ENCOUNTER — Emergency Department (HOSPITAL_COMMUNITY): Payer: Medicare Other

## 2016-04-26 DIAGNOSIS — I129 Hypertensive chronic kidney disease with stage 1 through stage 4 chronic kidney disease, or unspecified chronic kidney disease: Secondary | ICD-10-CM | POA: Diagnosis not present

## 2016-04-26 DIAGNOSIS — J449 Chronic obstructive pulmonary disease, unspecified: Secondary | ICD-10-CM | POA: Insufficient documentation

## 2016-04-26 DIAGNOSIS — Z96651 Presence of right artificial knee joint: Secondary | ICD-10-CM | POA: Diagnosis not present

## 2016-04-26 DIAGNOSIS — Z8551 Personal history of malignant neoplasm of bladder: Secondary | ICD-10-CM | POA: Diagnosis not present

## 2016-04-26 DIAGNOSIS — Z7952 Long term (current) use of systemic steroids: Secondary | ICD-10-CM | POA: Diagnosis not present

## 2016-04-26 DIAGNOSIS — Z79899 Other long term (current) drug therapy: Secondary | ICD-10-CM | POA: Diagnosis not present

## 2016-04-26 DIAGNOSIS — R1084 Generalized abdominal pain: Secondary | ICD-10-CM | POA: Diagnosis not present

## 2016-04-26 DIAGNOSIS — Z7982 Long term (current) use of aspirin: Secondary | ICD-10-CM | POA: Insufficient documentation

## 2016-04-26 DIAGNOSIS — C9110 Chronic lymphocytic leukemia of B-cell type not having achieved remission: Secondary | ICD-10-CM

## 2016-04-26 DIAGNOSIS — I2510 Atherosclerotic heart disease of native coronary artery without angina pectoris: Secondary | ICD-10-CM | POA: Insufficient documentation

## 2016-04-26 DIAGNOSIS — Z87891 Personal history of nicotine dependence: Secondary | ICD-10-CM | POA: Diagnosis not present

## 2016-04-26 DIAGNOSIS — Z8673 Personal history of transient ischemic attack (TIA), and cerebral infarction without residual deficits: Secondary | ICD-10-CM | POA: Insufficient documentation

## 2016-04-26 DIAGNOSIS — Z951 Presence of aortocoronary bypass graft: Secondary | ICD-10-CM | POA: Insufficient documentation

## 2016-04-26 DIAGNOSIS — N183 Chronic kidney disease, stage 3 (moderate): Secondary | ICD-10-CM | POA: Insufficient documentation

## 2016-04-26 DIAGNOSIS — K5900 Constipation, unspecified: Secondary | ICD-10-CM | POA: Diagnosis present

## 2016-04-26 LAB — COMPREHENSIVE METABOLIC PANEL WITH GFR
ALT: 18 U/L (ref 17–63)
AST: 26 U/L (ref 15–41)
Albumin: 3.3 g/dL — ABNORMAL LOW (ref 3.5–5.0)
Alkaline Phosphatase: 91 U/L (ref 38–126)
Anion gap: 12 (ref 5–15)
BUN: 37 mg/dL — ABNORMAL HIGH (ref 6–20)
CO2: 22 mmol/L (ref 22–32)
Calcium: 10.1 mg/dL (ref 8.9–10.3)
Chloride: 101 mmol/L (ref 101–111)
Creatinine, Ser: 1.94 mg/dL — ABNORMAL HIGH (ref 0.61–1.24)
GFR calc Af Amer: 35 mL/min — ABNORMAL LOW
GFR calc non Af Amer: 30 mL/min — ABNORMAL LOW
Glucose, Bld: 133 mg/dL — ABNORMAL HIGH (ref 65–99)
Potassium: 4.4 mmol/L (ref 3.5–5.1)
Sodium: 135 mmol/L (ref 135–145)
Total Bilirubin: 0.5 mg/dL (ref 0.3–1.2)
Total Protein: 6.3 g/dL — ABNORMAL LOW (ref 6.5–8.1)

## 2016-04-26 LAB — CBC WITH DIFFERENTIAL/PLATELET
Basophils Absolute: 0 K/uL (ref 0.0–0.1)
Basophils Relative: 0 %
Eosinophils Absolute: 0 K/uL (ref 0.0–0.7)
Eosinophils Relative: 0 %
HCT: 33.6 % — ABNORMAL LOW (ref 39.0–52.0)
Hemoglobin: 10.6 g/dL — ABNORMAL LOW (ref 13.0–17.0)
Lymphocytes Relative: 81 %
Lymphs Abs: 22.2 K/uL — ABNORMAL HIGH (ref 0.7–4.0)
MCH: 28.6 pg (ref 26.0–34.0)
MCHC: 31.5 g/dL (ref 30.0–36.0)
MCV: 90.6 fL (ref 78.0–100.0)
Monocytes Absolute: 0 K/uL — ABNORMAL LOW (ref 0.1–1.0)
Monocytes Relative: 0 %
Neutro Abs: 5.2 K/uL (ref 1.7–7.7)
Neutrophils Relative %: 19 %
Platelets: 329 K/uL (ref 150–400)
RBC: 3.71 MIL/uL — ABNORMAL LOW (ref 4.22–5.81)
RDW: 16.7 % — ABNORMAL HIGH (ref 11.5–15.5)
WBC: 27.4 K/uL — ABNORMAL HIGH (ref 4.0–10.5)

## 2016-04-26 LAB — I-STAT CHEM 8, ED
BUN: 33 mg/dL — ABNORMAL HIGH (ref 6–20)
Calcium, Ion: 1.37 mmol/L — ABNORMAL HIGH (ref 1.13–1.30)
Chloride: 101 mmol/L (ref 101–111)
Creatinine, Ser: 1.9 mg/dL — ABNORMAL HIGH (ref 0.61–1.24)
Glucose, Bld: 126 mg/dL — ABNORMAL HIGH (ref 65–99)
HCT: 36 % — ABNORMAL LOW (ref 39.0–52.0)
Hemoglobin: 12.2 g/dL — ABNORMAL LOW (ref 13.0–17.0)
Potassium: 4.4 mmol/L (ref 3.5–5.1)
Sodium: 135 mmol/L (ref 135–145)
TCO2: 23 mmol/L (ref 0–100)

## 2016-04-26 LAB — URINALYSIS, ROUTINE W REFLEX MICROSCOPIC
Bilirubin Urine: NEGATIVE
Glucose, UA: NEGATIVE mg/dL
Hgb urine dipstick: NEGATIVE
Ketones, ur: NEGATIVE mg/dL
Leukocytes, UA: NEGATIVE
Nitrite: NEGATIVE
Protein, ur: NEGATIVE mg/dL
Specific Gravity, Urine: 1.023 (ref 1.005–1.030)
pH: 5 (ref 5.0–8.0)

## 2016-04-26 LAB — LIPASE, BLOOD: Lipase: 33 U/L (ref 11–51)

## 2016-04-26 LAB — I-STAT CG4 LACTIC ACID, ED: Lactic Acid, Venous: 1.61 mmol/L (ref 0.5–2.0)

## 2016-04-26 LAB — LACTATE DEHYDROGENASE: LDH: 132 U/L (ref 98–192)

## 2016-04-26 MED ORDER — MORPHINE SULFATE (PF) 4 MG/ML IV SOLN
4.0000 mg | Freq: Once | INTRAVENOUS | Status: AC
  Administered 2016-04-26: 4 mg via INTRAVENOUS
  Filled 2016-04-26: qty 1

## 2016-04-26 MED ORDER — MORPHINE SULFATE (PF) 2 MG/ML IV SOLN
2.0000 mg | Freq: Once | INTRAVENOUS | Status: AC
  Administered 2016-04-26: 2 mg via INTRAVENOUS
  Filled 2016-04-26: qty 1

## 2016-04-26 MED ORDER — HYDROCODONE-ACETAMINOPHEN 5-325 MG PO TABS: 2.0000 | ORAL_TABLET | ORAL | Status: AC | PRN

## 2016-04-26 MED ORDER — DEXAMETHASONE SODIUM PHOSPHATE 10 MG/ML IJ SOLN
8.0000 mg | Freq: Once | INTRAMUSCULAR | Status: AC
  Administered 2016-04-26: 8 mg via INTRAVENOUS
  Filled 2016-04-26: qty 1

## 2016-04-26 MED ORDER — DEXAMETHASONE 2 MG PO TABS: 2.0000 mg | ORAL_TABLET | Freq: Two times a day (BID) | ORAL | Status: AC

## 2016-04-26 MED ORDER — PANTOPRAZOLE SODIUM 20 MG PO TBEC: 20.0000 mg | DELAYED_RELEASE_TABLET | Freq: Every day | ORAL | Status: AC

## 2016-04-26 NOTE — ED Notes (Signed)
C/o constipation x 2 weeks. No relief with OTC medications/prune juice/enemas. Unable to lay down to sleep d/t pain. Abdomin firm, tender, distended, denies nausea or vomiting.

## 2016-04-26 NOTE — ED Notes (Signed)
Patient reports that he hasn't had a "normal"  BM since being in the hospital at [facility] in April. Patient states his stools are "like water". Patient wife reports that she has been giving him multiple enemas, magnesium citrate, prune juice, and other OTC meds for constipation. Patient states his abd is generalized uncomfortable, with left pain greater than right with palpation.

## 2016-04-26 NOTE — Discharge Instructions (Signed)
Chronic Lymphocytic Leukemia Chronic lymphocytic leukemia (CLL) is a type of cancer of the bone marrow and blood cells. Bone marrow is the soft, spongy tissue inside your bone. In CLL, the bone marrow makes too many white blood cells that usually fight infection in the body (lymphocytes). CLL usually gets worse slowly and is the most common type of adult leukemia.  RISK FACTORS No one knows the exact cause of CLL. There is a higher risk of CLL in people who:   Are older than 50 years.  Are white.  Are male.  Have a family history of CLL or other cancers of the lymph system.  Are of [place] or [place] descent.  Have been exposed to certain chemicals, such as Agent Orange (used in the [place] War) or other herbicides or insecticides. SYMPTOMS  At first, there may be no symptoms of chronic lymphocytic leukemia. After a while, some symptoms may occur, such as:   Feeling more tired than usual, even after rest.  Unplanned weight loss.  Heavy sweating at night.  Fevers.  Shortness of breath.  Decreased energy.  Paleness.  Painless, swollen lymph nodes.  A feeling of fullness in the upper left part of the abdomen.  Easy bruising or bleeding.  More frequent infections. DIAGNOSIS  Your health care provider may perform the following exams and tests to diagnose CLL:  Physical exam to check for an enlarged spleen, liver, or lymph nodes.  Blood and bone marrow tests to identify the presence of cancer cells. These may include tests such as complete blood count, flow cytometry, immunophenotyping, and fluorescence in situ hybridization (FISH).  CT scan to look for swelling or abnormalities in your spleen, liver, and lymph nodes. TREATMENT  Treatment options for CLL depend on the stage and the presence of symptoms. There are a number of types of treatment used for this condition, including:  Observation.  Targeted drugs. These are drugs that interfere with  chemicals that leukemia cells need in order to grow and multiply. They identify and attack specific cancer cells without harming normal cells.  Chemotherapy drugs. These medicines kill cells that are multiplying quickly, such as leukemia cells.  Radiation.  Surgery to remove the spleen.  Biological therapy. This treatment boosts the ability of your own immune system to fight the leukemia cells.  Bone marrow or peripheral blood stem cell transplant. This treatment allows the patient to receive very high doses of chemotherapy or radiation or both. These high doses kill the cancer cells but also destroy the bone marrow. After treatment is complete, you are given donor bone marrow or stem cells, which will replace the bone marrow. HOME CARE INSTRUCTIONS   Because you have an increased risk of infection, practice good hand washing and avoid being around people who are ill or being in crowded places.  Because you have an increased risk of bleeding and bruising, avoid contact sports or other rough activities.  Take medicines only directed by your health care provider.  Although some of your treatments might affect your appetite, try to eat regular, healthy meals.  If you develop any side effects, such as nausea, diarrhea, rash, white patches in your mouth, a sore throat, difficulty swallowing, or severe fatigue, tell your health care provider. He or she may have recommendations of things you can do to improve symptoms.  Consider learning some ways to cope with the stress of having a chronic illness, such as yoga, meditation, or participating in a support group. SEEK  MEDICAL CARE IF:  You develop chest pains.  You notice pain, swelling or redness anywhere in your legs.  You have pain in your belly (abdomen).  You develop new bruises that are getting bigger.  You have painful or more swollen lymph nodes.  You develop bleeding from your gums, nose, or in your urine or stools.  You are  unable to stop throwing up (vomiting).  You cannot keep liquids down.  You feel light-headed.  You have a fever.  You develop a severe stiff neck or headache. SEEK IMMEDIATE MEDICAL CARE IF:  You have trouble breathing or feel short of breath.  You faint.   This information is not intended to replace advice given to you by your health care provider. Make sure you discuss any questions you have with your health care provider.  Follow up with your oncologist tomorrow for re-evaluation. Take steroids as prescribed. Take pain medication as needed. Return to the ED if you experience severe worsening of your symptoms, blood in stool, increased abdominal distention, fevers, chills.

## 2016-04-26 NOTE — ED Provider Notes (Signed)
CSN: [id number]     Arrival date & time 04/26/16  1447 History   First MD Initiated Contact with Patient 04/26/16 1507     Chief Complaint  Patient presents with  . Abdominal Pain  . Constipation     (Consider location/radiation/quality/duration/timing/severity/associated sxs/prior Treatment) HPI   [name] [name] is an 82 year old male with a past medical history bladder cancer, COPD, HTN, CAD, CLL who presents the ED today complaining of constipation and abdominal pain. Patient states that he was admitted to the hospital on April 10 4 in cancer removal. He was discharged 2 days later and since then his "bowel have just not been right". He reports ongoing constipation since that time. However, about 2 weeks ago he states constipation got significantly worse. He states that his abdomen Getting larger and larger and became very painful. Pain is diffuse over the abdomen. He has tried taking Ex-Lax, MiraLAX, Dulcolax, Fleet enema and warm prune juice without any relief of his symptoms. His last solid bowel movement was about 2 weeks ago. He states that yesterday he had a very tiny 1 with no relief of his symptoms as well and today he said that he expelled only "brown water". He denies any passing of flatus. Patient has not been taking any narcotics. He denies any nausea, vomiting, fevers, melena, hematochezia.   Past Medical History  Diagnosis Date  . HYPERLIPIDEMIA   . GOUT   . HYPERTENSION   . CAD     a. s/p CABG x 4 1988;  b. s/p redo CABG x 4 1996;  c. 04/2006 Cath/PCI: LM nl, LAD 100p, LCX 100, RCA 100, LIMA->LAD 40 anast, VG->LCX 95p (3.5x24 [facility] BMS), RIMA->RCA ok;  d. 12/2012 MV: smal area of ischemia in basal inferiolateral wall.  . TRANSIENT ISCHEMIC ATTACK   . NEPHROLITHIASIS   . CKD (chronic kidney disease), stage III   . CONTRAST DYE ALLERGY, HX OF   . HOH (hard of hearing)     a. uses hearing aids  . Cancer [facility]) 2013    bladder cancer, skin cancer  . Asthma   .  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 CLL (chronic lymphocytic leukemia) ([place]) 10/21/2015   Past Surgical History  Procedure Laterality Date  . Coronary artery bypass graft  1986  . Carotid endarterectomy    . Coronary artery bypass graft  1996  . Total knee arthroplasty  10/2009    rt  . Mass excision  05/27/2012    Procedure: EXCISION MASS;  Surgeon: Theodoro Kos, DO;  Location: [facility];  Service: Plastics;  Laterality: N/A;  repair of upper lip defect pt is post op  excision of skin cancer with tissue rearrangement or lip switch.  . Left heart catheterization with coronary/graft angiogram N/A 09/01/2013    Procedure: LEFT HEART CATHETERIZATION WITH Beatrix Fetters;  Surgeon: Peter M Martinique, MD;  Location: [facility] CATH LAB;  Service: Cardiovascular;  Laterality: N/A;   Family History  Problem Relation Age of Onset  . Heart attack Father   . Heart disease Sister   . Stroke Brother    Social History  Substance Use Topics  . Smoking status: Former Smoker -- 1.00 packs/day for 13 years    Types: Cigarettes    Start date: 02/06/1953    Quit date: 11/26/1965  . Smokeless tobacco: Never Used     Comment: quit smoking 46 years ago  . Alcohol Use: No    Review of Systems  All other systems reviewed and  are negative.     Allergies  Uloric; Ivp dye; and Statins  Home Medications   Prior to Admission medications   Medication Sig Start Date End Date Taking? Authorizing Provider  allopurinol (ZYLOPRIM) 100 MG tablet Take 100 mg by mouth daily as needed (for flareup).     Historical Provider, MD  amLODipine (NORVASC) 5 MG tablet Take 1 tablet (5 mg total) by mouth daily. 07/27/15   Lelon Perla, MD  aspirin EC 81 MG tablet Take 1 tablet (81 mg total) by mouth daily. 03/21/16   Lelon Perla, MD  calcium carbonate (TUMS - DOSED IN MG ELEMENTAL CALCIUM) 500 MG chewable tablet Chew 1 tablet by mouth at bedtime.     Historical Provider, MD  diphenhydrAMINE  (BENADRYL) 25 MG tablet Take 25 mg by mouth at bedtime.     Historical Provider, MD  enalapril (VASOTEC) 10 MG tablet Take 1 tablet (10 mg total) by mouth daily. 07/27/15   Lelon Perla, MD  metoprolol (LOPRESSOR) 50 MG tablet Take 0.5 tablets (25 mg total) by mouth 2 (two) times daily. 07/27/15   Lelon Perla, MD  nitroGLYCERIN (NITROSTAT) 0.4 MG SL tablet Place 1 tablet (0.4 mg total) under the tongue every 5 (five) minutes as needed for chest pain. 07/27/15   Lelon Perla, MD  predniSONE (DELTASONE) 20 MG tablet Take 2 tablets (40 mg total) by mouth daily. Patient taking differently: Take 40 mg by mouth as needed.  02/28/16   Marella Chimes, PA-C  Tamsulosin HCl (FLOMAX) 0.4 MG CAPS Take 0.4 mg by mouth daily as needed (for urination). Reported on 03/21/2016    Historical Provider, MD  triamcinolone ointment (KENALOG) 0.1 % Apply 1 application topically 2 (two) times daily as needed (dermatitis). Reported on 03/21/2016    Historical Provider, MD   BP 104/48 mmHg  Pulse 87  Temp(Src) 97.8 F (36.6 C) (Oral)  Resp 18  SpO2 92% Physical Exam  Constitutional: He is oriented to person, place, and time. He appears well-developed and well-nourished. No distress.  HENT:  Head: Normocephalic and atraumatic.  Mouth/Throat: No oropharyngeal exudate.  Eyes: Conjunctivae and EOM are normal. Pupils are equal, round, and reactive to light. Right eye exhibits no discharge. Left eye exhibits no discharge. No scleral icterus.  Cardiovascular: Normal rate, regular rhythm, normal heart sounds and intact distal pulses.  Exam reveals no gallop and no friction rub.   No murmur heard. Pulmonary/Chest: Effort normal and breath sounds normal. No respiratory distress. He has no wheezes. He has no rales. He exhibits no tenderness.  Abdominal: Soft. Bowel sounds are normal. He exhibits distension. He exhibits no mass. There is tenderness (  diffuse). There is no rebound and no guarding.  Musculoskeletal:  Normal range of motion. He exhibits no edema.  Lymphadenopathy:    He has cervical adenopathy ( right sided).  Neurological: He is alert and oriented to person, place, and time.  Large scar on left side of face 2/2 skin cancer removal  Skin: Skin is warm and dry. No rash noted. He is not diaphoretic. No erythema. No pallor.  Psychiatric: He has a normal mood and affect. His behavior is normal.  Nursing note and vitals reviewed.   ED Course  Procedures (including critical care time) Labs Review Labs Reviewed  CBC WITH DIFFERENTIAL/PLATELET - Abnormal; Notable for the following:    WBC 27.4 (*)    RBC 3.71 (*)    Hemoglobin 10.6 (*)    HCT 33.6 (*)  RDW 16.7 (*)    Lymphs Abs 22.2 (*)    Monocytes Absolute 0.0 (*)    All other components within normal limits  COMPREHENSIVE METABOLIC PANEL - Abnormal; Notable for the following:    Glucose, Bld 133 (*)    BUN 37 (*)    Creatinine, Ser 1.94 (*)    Total Protein 6.3 (*)    Albumin 3.3 (*)    GFR calc non Af Amer 30 (*)    GFR calc Af Amer 35 (*)    All other components within normal limits  URINALYSIS, ROUTINE W REFLEX MICROSCOPIC (NOT AT [facility]) - Abnormal; Notable for the following:    APPearance CLOUDY (*)    All other components within normal limits  I-STAT CHEM 8, ED - Abnormal; Notable for the following:    BUN 33 (*)    Creatinine, Ser 1.90 (*)    Glucose, Bld 126 (*)    Calcium, Ion 1.37 (*)    Hemoglobin 12.2 (*)    HCT 36.0 (*)    All other components within normal limits  LIPASE, BLOOD  LACTATE DEHYDROGENASE  PATHOLOGIST SMEAR REVIEW  I-STAT CG4 LACTIC ACID, ED    Imaging Review Ct Abdomen Pelvis Wo Contrast  04/26/2016  CLINICAL DATA:  Abdominal discomfort, distention and firmness. Chronic constipation. History of CLL. EXAM: CT ABDOMEN AND PELVIS WITHOUT CONTRAST TECHNIQUE: Multidetector CT imaging of the abdomen and pelvis was performed following the standard protocol without IV contrast. COMPARISON:   01/02/2016 FINDINGS: Lower chest: Bibasilar scarring versus atelectasis. No definite lower lobe pneumonia. No pericardial or pleural effusion. Normal heart size. Small hiatal hernia noted. Anterior pericardial adenopathy noted, measuring 12 mm in short axis, image 7. These are larger than the prior study. Hepatobiliary: No definite focal hepatic abnormality by noncontrast imaging. No biliary dilatation within the liver. Gallbladder and biliary system remain unremarkable. Small amount of new perihepatic ascites. Pancreas: No mass or inflammatory process identified on this un-enhanced exam. Spleen: Spleen is normal in size. Small amount of perisplenic ascites. Adrenals/Urinary Tract: Normal adrenal glands by noncontrast imaging. Kidneys demonstrate small similar parapelvic cysts. Punctate nonobstructing left lower pole renal calculus, image 35 measures 3 mm. No renal obstruction or hydronephrosis. No ureteral obstruction or calculus. Chronic perinephric strandy edema. No new finding. Stomach/Bowel: Negative for bowel obstruction, significant dilatation, ileus, or free air. Diverticulosis noted of the colon, most pronounced in the sigmoid within the pelvis. Vascular/Lymphatic: Extensive calcific atherosclerosis of the aorta without aneurysm. No retroperitoneal hemorrhage or acute hematoma. Innumerable central mesenteric lymph nodes. These have increased in both size and number. Index right mesenteric lymph node measures 24 mm, previously 20 mm. Numerous enlarging para-aortic and pericaval lymph nodes also noted. Diffuse mesenteric vascular congestion/ edema evident suspect secondary to the adenopathy. This has progressed. Small amount of abdominal ascites along the pericolic gutters extends into the pelvis. Enlarged diffuse iliac chain lymph nodes present. Within the pelvis, there is a large perirectal lymph node measuring 16 mm, previously 14 mm. External iliac and inguinal lymph nodes are also larger. Index right  inguinal lymph node now measures 14 mm in short axis, previously 12 mm. These findings suggest progression of CLL throughout the abdomen and pelvis. Reproductive: Prostate gland is enlarged with calcification. Seminal vesicles are atrophic. Other: No other inguinal abnormality or hernia. Intact abdominal wall. Musculoskeletal: Degenerative osteophytes of the lower thoracic and lumbar spine. Chronic bilateral L5 pars defects. IMPRESSION: Progression of diffuse central mesenteric, retroperitoneal, iliac and inguinal adenopathy with mesenteric edema/  congestion. Interval development of small amount of abdominal ascites. Appearance is compatible with worsening CLL. No associated obstruction or free air. Other chronic findings are stable as above. Electronically Signed   By: Jerilynn Mages.  Shick M.D.   On: 04/26/2016 17:24   I have personally reviewed and evaluated these images and lab results as part of my medical decision-making.   EKG Interpretation None      MDM   Final diagnoses:  CLL (chronic lymphocytic leukemia) (HCC)  Generalized abdominal pain    82 year old male with history of CLL, CAD, HTN, skin and bladder cancer presents to ED with progressively worsening constipation, abdominal distention and diffuse abdominal pain. Patient states he has not had a normal bowel movement in 2 weeks and denies passing flatus. Abdomen is very distended and tender to touch. Vitals are stable and patient appears to be uncomfortable. Concern for bowel obstruction. CT abdomen reveals no obstruction however there is progression of diffuse central mesenteric, retroperitoneal, iliac and inguinal adenopathy was mesenteric edema/congestion. This is compatible with worsening CLL. Pain was managed in ED. All of patient's blood work is at his baseline when compared to previous labs. I spoke with on-call oncologist Dr.Kale who recommends ordering a lactic acid and LDH to rule out mesenteric ischemia. The labs are normal patient may  follow-up with his oncologist tomorrow. We will place patient on steroid taper as recommended by Dr.Kale and pain medication. Patient will likely start chemotherapy for his CLL.  Lactic acid is within normal limits LDH within normal limits as well. Patient will be [place] home with dexamethasone steroid taper, PPI and pain medication. I discussed this treatment plan with the patient and his family to are agreeable. Return precautions outlined in patient discharge instructions.  Patient was discussed with and seen by Dr. Alfonse Spruce who agrees with the treatment plan.      Dondra Spry [place], PA-C 04/26/16 2116  Harvel Quale, MD 04/28/16 [phone]

## 2016-04-26 NOTE — ED Notes (Signed)
Patient attempting to provide urine sample at this time.

## 2016-04-26 NOTE — ED Notes (Signed)
Nurse is starting a IV on patient and getting the labs

## 2016-04-26 NOTE — ED Notes (Signed)
Patient drinking contrast, 1 bottle completed.

## 2016-04-27 LAB — PATHOLOGIST SMEAR REVIEW

## 2016-04-30 ENCOUNTER — Encounter: Payer: Self-pay | Admitting: Hematology & Oncology

## 2016-04-30 ENCOUNTER — Other Ambulatory Visit (HOSPITAL_BASED_OUTPATIENT_CLINIC_OR_DEPARTMENT_OTHER): Payer: Medicare Other

## 2016-04-30 ENCOUNTER — Ambulatory Visit (HOSPITAL_BASED_OUTPATIENT_CLINIC_OR_DEPARTMENT_OTHER): Payer: Medicare Other

## 2016-04-30 ENCOUNTER — Ambulatory Visit (HOSPITAL_BASED_OUTPATIENT_CLINIC_OR_DEPARTMENT_OTHER): Payer: Medicare Other | Admitting: Hematology & Oncology

## 2016-04-30 VITALS — BP 108/44 | HR 90 | Temp 97.4°F | Resp 18 | Ht 68.0 in | Wt 167.0 lb

## 2016-04-30 DIAGNOSIS — C9110 Chronic lymphocytic leukemia of B-cell type not having achieved remission: Secondary | ICD-10-CM

## 2016-04-30 DIAGNOSIS — D801 Nonfamilial hypogammaglobulinemia: Secondary | ICD-10-CM | POA: Diagnosis not present

## 2016-04-30 DIAGNOSIS — J441 Chronic obstructive pulmonary disease with (acute) exacerbation: Secondary | ICD-10-CM

## 2016-04-30 DIAGNOSIS — E860 Dehydration: Secondary | ICD-10-CM

## 2016-04-30 LAB — CBC WITH DIFFERENTIAL (CANCER CENTER ONLY)
HCT: 31.2 % — ABNORMAL LOW (ref 38.7–49.9)
HGB: 10.1 g/dL — ABNORMAL LOW (ref 13.0–17.1)
MCH: 28.6 pg (ref 28.0–33.4)
MCHC: 32.4 g/dL (ref 32.0–35.9)
MCV: 88 fL (ref 82–98)
Platelets: 440 10*3/uL — ABNORMAL HIGH (ref 145–400)
RBC: 3.53 10*6/uL — AB (ref 4.20–5.70)
RDW: 16.4 % — AB (ref 11.1–15.7)
WBC: 35.3 10*3/uL — AB (ref 4.0–10.0)

## 2016-04-30 LAB — CMP (CANCER CENTER ONLY)
ALT: 26 U/L (ref 10–47)
AST: 35 U/L (ref 11–38)
Albumin: 2.8 g/dL — ABNORMAL LOW (ref 3.3–5.5)
Alkaline Phosphatase: 103 U/L — ABNORMAL HIGH (ref 26–84)
BUN: 65 mg/dL — AB (ref 7–22)
CO2: 22 meq/L (ref 18–33)
Calcium: 11.2 mg/dL — ABNORMAL HIGH (ref 8.0–10.3)
Chloride: 94 mEq/L — ABNORMAL LOW (ref 98–108)
Creat: 1.7 mg/dl — ABNORMAL HIGH (ref 0.6–1.2)
GLUCOSE: 155 mg/dL — AB (ref 73–118)
POTASSIUM: 4.7 meq/L (ref 3.3–4.7)
Sodium: 133 mEq/L (ref 128–145)
Total Bilirubin: 0.6 mg/dl (ref 0.20–1.60)
Total Protein: 6.3 g/dL — ABNORMAL LOW (ref 6.4–8.1)

## 2016-04-30 LAB — MANUAL DIFFERENTIAL (CHCC SATELLITE)
ALC: 24 10*3/uL — ABNORMAL HIGH (ref 0.9–3.3)
ANC (CHCC MAN DIFF): 8.1 10*3/uL — AB (ref 1.5–6.5)
Band Neutrophils: 1 % (ref 0–10)
Eos: 1 % (ref 0–7)
LYMPH: 68 % — AB (ref 14–48)
MONO: 8 % (ref 0–13)
NRBC: 1 % — AB (ref 0–0)
PLT EST ~~LOC~~: INCREASED
SEG: 22 % — ABNORMAL LOW (ref 40–75)

## 2016-04-30 LAB — CHCC SATELLITE - SMEAR

## 2016-04-30 MED ORDER — SODIUM CHLORIDE 0.9 % IV SOLN
20.0000 mg | Freq: Once | INTRAVENOUS | Status: AC
  Administered 2016-04-30: 20 mg via INTRAVENOUS
  Filled 2016-04-30: qty 2

## 2016-04-30 MED ORDER — FAMCICLOVIR 250 MG PO TABS: 250.0000 mg | ORAL_TABLET | Freq: Every day | ORAL | Status: AC

## 2016-04-30 MED ORDER — SODIUM CHLORIDE 0.9 % IV SOLN
INTRAVENOUS | Status: AC
  Administered 2016-04-30: 09:00:00 via INTRAVENOUS

## 2016-04-30 MED ORDER — FAMOTIDINE IN NACL 20-0.9 MG/50ML-% IV SOLN
INTRAVENOUS | Status: AC
  Filled 2016-04-30: qty 100

## 2016-04-30 MED ORDER — FAMOTIDINE IN NACL 20-0.9 MG/50ML-% IV SOLN
40.0000 mg | Freq: Once | INTRAVENOUS | Status: AC
Start: 2016-04-30 — End: 2016-04-30
  Administered 2016-04-30: 40 mg via INTRAVENOUS

## 2016-04-30 MED ORDER — PANTOPRAZOLE SODIUM 40 MG IV SOLR: 40.0000 mg | Freq: Once | INTRAVENOUS | Status: DC

## 2016-04-30 NOTE — Patient Instructions (Signed)

## 2016-04-30 NOTE — Progress Notes (Signed)
Hematology and Oncology Follow Up Visit  [name] [name] [id number] 1930/12/02 82 y.o. 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 He has decreased in performance status since we last saw him in April. He had go to emergency room. He's been falling area and he has left-sided facial nerve weakness because of left parotid gland surgery.  When he went emergency room, he had a CT scan done. This showed increase in adenopathy. Thank you, there is no bulky adenopathy. I think the largest lymph node measured 2.4 cm. There is no obstruction. He has no ascites. He has arthritic issues.  We last saw him, we try to get him on Imbruvica. Unfortunate, his co-pay would be high over $1000. We have not been able to get this for him. This is unfortunate. I think if we could've got him on Imbruvica, a lot of the current issues could be minimized.  He says he's having problems swallowing. Says his food gets stuck down in the lower part of his chest. Again, I am not sure if this is from adenopathy pressing on the esophagus.  He definitely appears to be somewhat dehydrated.  He has had no vomiting. His appetite is decreased. He is able to eat mechanical soft foods and liquids.  He may have some reflux.  There has not been any bleeding.  He has had no fever.  Overall, his performance status is ECOG 2. Medications:    Medication List       This list is accurate as of: 04/30/16  4:03 PM.  Always use your most recent med list.               allopurinol 100 MG tablet  Commonly known as:  ZYLOPRIM  Take 100 mg by mouth daily as needed (for flareup).     amLODipine 5 MG tablet  Commonly known as:  NORVASC  Take 1 tablet (5 mg total) by mouth daily.     amoxicillin  500 MG capsule  Commonly known as:  AMOXIL  Take 500 mg by mouth 3 (three) times daily. Started 05/30 for 10 days     aspirin EC 81 MG tablet  Take 1 tablet (81 mg total) by mouth daily.     calcium carbonate 500 MG chewable tablet  Commonly known as:  TUMS - dosed in mg elemental calcium  Chew 1 tablet by mouth at bedtime.     dexamethasone 2 MG tablet  Commonly known as:  DECADRON  Take 1 tablet (2 mg total) by mouth 2 (two) times daily with a meal.     diphenhydrAMINE 25 MG tablet  Commonly known as:  BENADRYL  Take 50 mg by mouth at bedtime.     enalapril 10 MG tablet  Commonly known as:  VASOTEC  Take 1 tablet (10 mg total) by mouth daily.     EYE DROPS OP  Place 1 drop into the left eye at bedtime. Unknown name of eye drop     famciclovir 250 MG tablet  Commonly known as:  FAMVIR  Take 1 tablet (250 mg total) by mouth daily.     Fish Oil 1000 MG Caps  Take 1 capsule by mouth daily.     HYDROcodone-acetaminophen 5-325 MG tablet  Commonly known  as:  NORCO/VICODIN  Take 2 tablets by mouth every 4 (four) hours as needed.     ibuprofen 200 MG tablet  Commonly known as:  ADVIL,MOTRIN  Take 200-400 mg by mouth every 6 (six) hours as needed for fever, headache, mild pain, moderate pain or cramping.     metoprolol 50 MG tablet  Commonly known as:  LOPRESSOR  Take 0.5 tablets (25 mg total) by mouth 2 (two) times daily.     mupirocin ointment 2 %  Commonly known as:  BACTROBAN  Place 1 application into the nose 2 (two) times daily as needed (for dermatitis).     nitroGLYCERIN 0.4 MG SL tablet  Commonly known as:  NITROSTAT  Place 1 tablet (0.4 mg total) under the tongue every 5 (five) minutes as needed for chest pain.     pantoprazole 20 MG tablet  Commonly known as:  PROTONIX  Take 1 tablet (20 mg total) by mouth daily.     predniSONE 5 MG tablet  Commonly known as:  DELTASONE  Take 5 mg by mouth daily as needed (for gout flare ups).     RED YEAST RICE PO    Take 1 tablet by mouth daily.     tamsulosin 0.4 MG Caps capsule  Commonly known as:  FLOMAX  Take 0.4 mg by mouth daily as needed (for urination). Reported on 03/21/2016        Allergies:  Allergies  Allergen Reactions  . Uloric [Febuxostat] Other (See Comments)    Pt states "it locked me up, I couldn't move from the waist up" Upper body paralysis-temporary  . Ivp Dye [Iodinated Diagnostic Agents] Other (See Comments)    Patient continued sneezing   . Statins Other (See Comments)    Patient unable to walk  . Meloxicam Rash    Past Medical History, Surgical history, Social history, and Family History were reviewed and updated.  Review of Systems: All other 10 point review of systems is negative.   Physical Exam:  height is 5\' 8"  (1.727 m) and weight is 167 lb (75.751 kg). His oral temperature is 97.4 F (36.3 C). His blood pressure is 108/44 and his pulse is 90. His respiration is 18.   Wt Readings from Last 3 Encounters:  04/30/16 167 lb (75.751 kg)  03/21/16 154 lb (69.854 kg)  03/02/16 176 lb (79.833 kg)    Elderly white male in no obvious distress. Head and neck exam shows the surgical excision scars. He has a large one on the left side of his face. This is healing in. He has a small one on the right side of his face. This also is healing. He has no obvious adenopathy in the neck. Thyroid is nonpalpable. He has no intraoral lesions. His lungs show some wheezes and crackles bilaterally. He has decent air movement. Cardiac exam regular rate and rhythm with no murmurs, rubs or bruits. Abdomen is soft. He has no fluid wave. There is no palpable liver or spleen tip. Back exam shows no tenderness over the spine, ribs or hips. Extremities shows no clubbing, cyanosis or edema. Skin exam does show numerous actinic keratoses and solar keratoses. If I has some basal cell and squamous cell carcinomas on his skin. Neurological exam shows no focal neurological deficits.  Lab Results   Component Value Date   WBC 35.3* 04/30/2016   HGB 10.1* 04/30/2016   HCT 31.2* 04/30/2016   MCV 88 04/30/2016   PLT 440* 04/30/2016   No results found  for: FERRITIN, IRON, TIBC, UIBC, IRONPCTSAT Lab Results  Component Value Date   RBC 3.53* 04/30/2016   No results found for: Nils Pyle [facility] Lab Results  Component Value Date   IGGSERUM 452* 03/02/2016   IGA 17* 12/01/2015   IGMSERUM 5* 03/02/2016   Lab Results  Component Value Date   TOTALPROTELP 6.2 10/21/2015   ALBUMINELP 3.8 10/21/2015   A1GS 0.3 10/21/2015   A2GS 0.9 10/21/2015   BETS 0.4 10/21/2015   BETA2SER 0.2 10/21/2015   GAMS 0.3* 10/21/2015   MSPIKE Not Observed 03/02/2016   SPEI * 10/21/2015     Chemistry      Component Value Date/Time   NA 133 04/30/2016 0807   NA 135 04/26/2016 1624   NA 137 03/02/2016 1143   K 4.7 04/30/2016 0807   K 4.4 04/26/2016 1624   K 4.4 03/02/2016 1143   CL 94* 04/30/2016 0807   CL 101 04/26/2016 1624   CO2 22 04/30/2016 0807   CO2 22 04/26/2016 1558   CO2 22 03/02/2016 1143   BUN 65* 04/30/2016 0807   BUN 33* 04/26/2016 1624   BUN 39.1* 03/02/2016 1143   CREATININE 1.7* 04/30/2016 0807   CREATININE 1.90* 04/26/2016 1624   CREATININE 1.7* 03/02/2016 1143      Component Value Date/Time   CALCIUM 11.2* 04/30/2016 0807   CALCIUM 10.1 04/26/2016 1558   CALCIUM 9.9 03/02/2016 1143   ALKPHOS 103* 04/30/2016 0807   ALKPHOS 91 04/26/2016 1558   ALKPHOS 73 03/02/2016 1143   AST 35 04/30/2016 0807   AST 26 04/26/2016 1558   AST 13 03/02/2016 1143   ALT 26 04/30/2016 0807   ALT 18 04/26/2016 1558   ALT 10 03/02/2016 1143   BILITOT 0.60 04/30/2016 0807   BILITOT 0.5 04/26/2016 1558   BILITOT <0.30 03/02/2016 1143     Impression and Plan: Mr. Yake is 82 year old gentleman with history of CLL.  He has hypogammaglobulinemia from the CLL. This is causing him to have multiple bouts of infection, particularly pneumonia. He has been  hospitalized.  It looks like, he: Needs to be started on therapy. He is having more symptoms.  We tried to get him on Ibrutinib but unfortunate, the co-pay so expensive. As such, we are going to have to go with IV chemotherapy.   I talked to he and his family for about 45 minutes. We really need to get started with treatment this week because of his symptoms.  I think that Rituxan/bendamustine would be very reasonable. I think he could tolerate this. He definitely will need a reduced dose bendamustine.   Hopefully, we can get away without having to put a Port-A-Cath in area and he does not have the best veins but I think we might be able to use them.  I feel bad that he has the left facial nerve weakness because of the parotid surgery. I don't know if he'll ever regain function of the left facial nerve.  I really believe that with Rituxan/bendamustine combination, we can cause a significant and regression of his CLL. I think that hopefully his symptoms will improve. I'm not sure if his swallowing problems are related to CLL.  Given his overall performance status and age, I really don't think we have to put him through a bone marrow test. I don't we would need an additional scans.  Our goal as his quality of life. As all he wants is having better quality lysergic enjoy being outside.   If all  goes well, we can start treatment this week.   I gave him a prescription for Famvir. He already is on allopurinol.   I will give him IV fluids today. I'll leave this will be very important for him. I'll give him some Decadron and some Pepcid IV. Hopefully this will make him feel a little bit better.    [name] Napoleon, MD 6/5/20174:03 PM

## 2016-05-01 LAB — PROTEIN ELECTROPHORESIS, SERUM, WITH REFLEX
A/G RATIO SPE: 0.8 (ref 0.7–1.7)
ALBUMIN: 2.6 g/dL — AB (ref 2.9–4.4)
ALPHA 2: 1.2 g/dL — AB (ref 0.4–1.0)
Alpha 1: 0.5 g/dL — ABNORMAL HIGH (ref 0.0–0.4)
BETA: 1.1 g/dL (ref 0.7–1.3)
Gamma Globulin: 0.3 g/dL — ABNORMAL LOW (ref 0.4–1.8)
Globulin, Total: 3.1 g/dL (ref 2.2–3.9)
Total Protein: 5.7 g/dL — ABNORMAL LOW (ref 6.0–8.5)

## 2016-05-01 LAB — KAPPA/LAMBDA LIGHT CHAINS
Ig Kappa Free Light Chain: 40.7 mg/L — ABNORMAL HIGH (ref 3.3–19.4)
Ig Lambda Free Light Chain: 8.2 mg/L (ref 5.7–26.3)
Kappa/Lambda FluidC Ratio: 4.96 — ABNORMAL HIGH (ref 0.26–1.65)

## 2016-05-01 LAB — IGG, IGA, IGM
IGA/IMMUNOGLOBULIN A, SERUM: 13 mg/dL — AB (ref 61–437)
IGG (IMMUNOGLOBIN G), SERUM: 251 mg/dL — AB (ref 700–1600)
IgM, Qn, Serum: <5 mg/dL — ABNORMAL LOW (ref 15–143)

## 2016-05-02 ENCOUNTER — Encounter: Payer: Self-pay | Admitting: *Deleted

## 2016-05-02 ENCOUNTER — Ambulatory Visit (HOSPITAL_BASED_OUTPATIENT_CLINIC_OR_DEPARTMENT_OTHER): Payer: Medicare Other

## 2016-05-02 ENCOUNTER — Other Ambulatory Visit: Payer: Self-pay | Admitting: Family

## 2016-05-02 VITALS — BP 96/45 | HR 75 | Temp 98.2°F | Resp 16

## 2016-05-02 DIAGNOSIS — C9110 Chronic lymphocytic leukemia of B-cell type not having achieved remission: Secondary | ICD-10-CM | POA: Diagnosis not present

## 2016-05-02 DIAGNOSIS — D801 Nonfamilial hypogammaglobulinemia: Secondary | ICD-10-CM

## 2016-05-02 DIAGNOSIS — Z5112 Encounter for antineoplastic immunotherapy: Secondary | ICD-10-CM | POA: Diagnosis not present

## 2016-05-02 DIAGNOSIS — Z5111 Encounter for antineoplastic chemotherapy: Secondary | ICD-10-CM

## 2016-05-02 MED ORDER — ACETAMINOPHEN 325 MG PO TABS
650.0000 mg | ORAL_TABLET | Freq: Once | ORAL | Status: AC
  Administered 2016-05-02: 650 mg via ORAL

## 2016-05-02 MED ORDER — ACETAMINOPHEN 325 MG PO TABS
ORAL_TABLET | ORAL | Status: AC
  Filled 2016-05-02: qty 2

## 2016-05-02 MED ORDER — SODIUM CHLORIDE 0.9 % IV SOLN
375.0000 mg/m2 | Freq: Once | INTRAVENOUS | Status: AC
  Administered 2016-05-02: 700 mg via INTRAVENOUS
  Filled 2016-05-02: qty 60

## 2016-05-02 MED ORDER — SODIUM CHLORIDE 0.9 % IV SOLN
Freq: Once | INTRAVENOUS | Status: AC
Start: 2016-05-02 — End: 2016-05-02
  Administered 2016-05-02: 10:00:00 via INTRAVENOUS

## 2016-05-02 MED ORDER — ONDANSETRON HCL 8 MG PO TABS: 8.0000 mg | ORAL_TABLET | Freq: Two times a day (BID) | ORAL | Status: DC | PRN

## 2016-05-02 MED ORDER — PALONOSETRON HCL INJECTION 0.25 MG/5ML
INTRAVENOUS | Status: AC
  Filled 2016-05-02: qty 5

## 2016-05-02 MED ORDER — PALONOSETRON HCL INJECTION 0.25 MG/5ML
0.2500 mg | Freq: Once | INTRAVENOUS | Status: AC
  Administered 2016-05-02: 0.25 mg via INTRAVENOUS

## 2016-05-02 MED ORDER — MEPERIDINE HCL 25 MG/ML IJ SOLN
12.5000 mg | Freq: Once | INTRAMUSCULAR | Status: AC
  Administered 2016-05-02: 12.5 mg via INTRAVENOUS

## 2016-05-02 MED ORDER — DIPHENHYDRAMINE HCL 25 MG PO CAPS
50.0000 mg | ORAL_CAPSULE | Freq: Once | ORAL | Status: AC
  Administered 2016-05-02: 50 mg via ORAL

## 2016-05-02 MED ORDER — SODIUM CHLORIDE 0.9 % IV SOLN
10.0000 mg | Freq: Once | INTRAVENOUS | Status: AC
  Administered 2016-05-02: 10 mg via INTRAVENOUS
  Filled 2016-05-02: qty 1

## 2016-05-02 MED ORDER — METHYLPREDNISOLONE SODIUM SUCC 125 MG IJ SOLR
80.0000 mg | Freq: Once | INTRAMUSCULAR | Status: AC
  Administered 2016-05-02: 80 mg via INTRAVENOUS

## 2016-05-02 MED ORDER — DIPHENHYDRAMINE HCL 25 MG PO CAPS
ORAL_CAPSULE | ORAL | Status: AC
  Filled 2016-05-02: qty 2

## 2016-05-02 MED ORDER — DEXAMETHASONE 4 MG PO TABS: 8.0000 mg | ORAL_TABLET | Freq: Every day | ORAL | Status: DC

## 2016-05-02 MED ORDER — MEPERIDINE HCL 25 MG/ML IJ SOLN
INTRAMUSCULAR | Status: AC
  Filled 2016-05-02: qty 1

## 2016-05-02 MED ORDER — SODIUM CHLORIDE 0.9 % IV SOLN
75.0000 mg/m2 | Freq: Once | INTRAVENOUS | Status: AC
  Administered 2016-05-02: 150 mg via INTRAVENOUS
  Filled 2016-05-02: qty 6

## 2016-05-02 NOTE — Patient Instructions (Signed)
Bendamustine Injection  What is this medicine?  BENDAMUSTINE (BEN da MUS teen) is a chemotherapy drug. It is used to treat chronic lymphocytic leukemia and non-Hodgkin lymphoma.  This medicine may be used for other purposes; ask your health care provider or pharmacist if you have questions.  What should I tell my health care provider before I take this medicine?  They need to know if you have any of these conditions:  -infection (especially a virus infection such as chickenpox, cold sores, or herpes)  -kidney disease  -liver disease  -an unusual or allergic reaction to bendamustine, mannitol, other medicines, foods, dyes, or preservatives  -pregnant or trying to get pregnant  -breast-feeding  How should I use this medicine?  This medicine is for infusion into a vein. It is given by a health care professional in a hospital or clinic setting.  Talk to your pediatrician regarding the use of this medicine in children. Special care may be needed.  Overdosage: If you think you have taken too much of this medicine contact a poison control center or emergency room at once.  NOTE: This medicine is only for you. Do not share this medicine with others.  What if I miss a dose?  It is important not to miss your dose. Call your doctor or health care professional if you are unable to keep an appointment.  What may interact with this medicine?  Do not take this medicine with any of the following medications:  -clozapine  This medicine may also interact with the following medications:  -atazanavir  -cimetidine  -ciprofloxacin  -enoxacin  -fluvoxamine  -medicines for seizures like carbamazepine and phenobarbital  -mexiletine  -rifampin  -tacrine  -thiabendazole  -zileuton  This list may not describe all possible interactions. Give your health care provider a list of all the medicines, herbs, non-prescription drugs, or dietary supplements you use. Also tell them if you smoke, drink alcohol, or use illegal drugs. Some items may  interact with your medicine.  What should I watch for while using this medicine?  This drug may make you feel generally unwell. This is not uncommon, as chemotherapy can affect healthy cells as well as cancer cells. Report any side effects. Continue your course of treatment even though you feel ill unless your doctor tells you to stop.  You may need blood work done while you are taking this medicine.  Call your doctor or health care professional for advice if you get a fever, chills or sore throat, or other symptoms of a cold or flu. Do not treat yourself. This drug decreases your body's ability to fight infections. Try to avoid being around people who are sick.  This medicine may increase your risk to bruise or bleed. Call your doctor or health care professional if you notice any unusual bleeding.  Talk to your doctor about your risk of cancer. You may be more at risk for certain types of cancers if you take this medicine.  Do not become pregnant while taking this medicine or for 3 months after stopping it. Women should inform their doctor if they wish to become pregnant or think they might be pregnant. Men should not father a child while taking this medicine and for 3 months after stopping it.There is a potential for serious side effects to an unborn child. Talk to your health care professional or pharmacist for more information. Do not breast-feed an infant while taking this medicine.  This medicine may interfere with the ability to have a   child. You should talk with your doctor or health care professional if you are concerned about your fertility.  What side effects may I notice from receiving this medicine?  Side effects that you should report to your doctor or health care professional as soon as possible:  -allergic reactions like skin rash, itching or hives, swelling of the face, lips, or tongue  -low blood counts - this medicine may decrease the number of white blood cells, red blood cells and platelets. You  may be at increased risk for infections and bleeding.  -redness, blistering, peeling or loosening of the skin, including inside the mouth  -signs of infection - fever or chills, cough, sore throat, pain or difficulty passing urine  -signs of decreased platelets or bleeding - bruising, pinpoint red spots on the skin, black, tarry stools, blood in the urine  -signs of decreased red blood cells - unusually weak or tired, fainting spells, lightheadedness  -signs and symptoms of kidney injury like trouble passing urine or change in the amount of urine  Side effects that usually do not require medical attention (report to your doctor or health care professional if they continue or are bothersome):  -constipation  -decreased appetite  -diarrhea  -headache  -mouth sores  -nausea/vomiting  -tiredness  This list may not describe all possible side effects. Call your doctor for medical advice about side effects. You may report side effects to FDA at 1-800-FDA-1088.  Where should I keep my medicine?  This drug is given in a hospital or clinic and will not be stored at home.  NOTE: This sheet is a summary. It may not cover all possible information. If you have questions about this medicine, talk to your doctor, pharmacist, or health care provider.      2016, [employer] Standard. (2014-11-05 13:53:28)          Rituximab injection  What is this medicine?  RITUXIMAB (ri TUX i mab) is a monoclonal antibody. It is used commonly to treat non-Hodgkin lymphoma and other conditions. It is also used to treat rheumatoid arthritis (RA). In RA, this medicine slows the inflammatory process and help reduce joint pain and swelling. This medicine is often used with other cancer or arthritis medications.  This medicine may be used for other purposes; ask your health care provider or pharmacist if you have questions.  What should I tell my health care provider before I take this medicine?  They need to know if you have any of these  conditions:  -blood disorders  -heart disease  -history of hepatitis B  -infection (especially a virus infection such as chickenpox, cold sores, or herpes)  -irregular heartbeat  -kidney disease  -lung or breathing disease, like asthma  -lupus  -an unusual or allergic reaction to rituximab, mouse proteins, other medicines, foods, dyes, or preservatives  -pregnant or trying to get pregnant  -breast-feeding  How should I use this medicine?  This medicine is for infusion into a vein. It is administered in a hospital or clinic by a specially trained health care professional.  A special MedGuide will be given to you by the pharmacist with each prescription and refill. Be sure to read this information carefully each time.  Talk to your pediatrician regarding the use of this medicine in children. This medicine is not approved for use in children.  Overdosage: If you think you have taken too much of this medicine contact a poison control center or emergency room at once.  NOTE: This medicine is   only for you. Do not share this medicine with others.  What if I miss a dose?  It is important not to miss a dose. Call your doctor or health care professional if you are unable to keep an appointment.  What may interact with this medicine?  -cisplatin  -medicines for blood pressure  -some other medicines for arthritis  -vaccines  This list may not describe all possible interactions. Give your health care provider a list of all the medicines, herbs, non-prescription drugs, or dietary supplements you use. Also tell them if you smoke, drink alcohol, or use illegal drugs. Some items may interact with your medicine.  What should I watch for while using this medicine?  Report any side effects that you notice during your treatment right away, such as changes in your breathing, fever, chills, dizziness or lightheadedness. These effects are more common with the first dose.  Visit your prescriber or health care professional for checks on your  progress. You will need to have regular blood work. Report any other side effects. The side effects of this medicine can continue after you finish your treatment. Continue your course of treatment even though you feel ill unless your doctor tells you to stop.  Call your doctor or health care professional for advice if you get a fever, chills or sore throat, or other symptoms of a cold or flu. Do not treat yourself. This drug decreases your body's ability to fight infections. Try to avoid being around people who are sick.  This medicine may increase your risk to bruise or bleed. Call your doctor or health care professional if you notice any unusual bleeding.  Be careful brushing and flossing your teeth or using a toothpick because you may get an infection or bleed more easily. If you have any dental work done, tell your dentist you are receiving this medicine.  Avoid taking products that contain aspirin, acetaminophen, ibuprofen, naproxen, or ketoprofen unless instructed by your doctor. These medicines may hide a fever.  Do not become pregnant while taking this medicine. Women should inform their doctor if they wish to become pregnant or think they might be pregnant. There is a potential for serious side effects to an unborn child. Talk to your health care professional or pharmacist for more information. Do not breast-feed an infant while taking this medicine.  What side effects may I notice from receiving this medicine?  Side effects that you should report to your doctor or health care professional as soon as possible:  -allergic reactions like skin rash, itching or hives, swelling of the face, lips, or tongue  -low blood counts - this medicine may decrease the number of white blood cells, red blood cells and platelets. You may be at increased risk for infections and bleeding.  -signs of infection - fever or chills, cough, sore throat, pain or difficulty passing urine  -signs of decreased platelets or bleeding -  bruising, pinpoint red spots on the skin, black, tarry stools, blood in the urine  -signs of decreased red blood cells - unusually weak or tired, fainting spells, lightheadedness  -breathing problems  -confused, not responsive  -chest pain  -fast, irregular heartbeat  -feeling faint or lightheaded, falls  -mouth sores  -redness, blistering, peeling or loosening of the skin, including inside the mouth  -stomach pain  -swelling of the ankles, feet, or hands  -trouble passing urine or change in the amount of urine  Side effects that usually do not require medical attention (report to

## 2016-05-03 ENCOUNTER — Ambulatory Visit (HOSPITAL_BASED_OUTPATIENT_CLINIC_OR_DEPARTMENT_OTHER): Payer: Medicare Other

## 2016-05-03 VITALS — BP 123/49 | HR 90 | Temp 97.5°F | Resp 18

## 2016-05-03 DIAGNOSIS — Z5111 Encounter for antineoplastic chemotherapy: Secondary | ICD-10-CM | POA: Diagnosis not present

## 2016-05-03 DIAGNOSIS — C9110 Chronic lymphocytic leukemia of B-cell type not having achieved remission: Secondary | ICD-10-CM

## 2016-05-03 MED ORDER — SODIUM CHLORIDE 0.9 % IV SOLN
75.0000 mg/m2 | Freq: Once | INTRAVENOUS | Status: AC
  Administered 2016-05-03: 150 mg via INTRAVENOUS
  Filled 2016-05-03: qty 6

## 2016-05-03 MED ORDER — SODIUM CHLORIDE 0.9 % IV SOLN
10.0000 mg | Freq: Once | INTRAVENOUS | Status: AC
  Administered 2016-05-03: 10 mg via INTRAVENOUS
  Filled 2016-05-03: qty 1

## 2016-05-03 MED ORDER — SODIUM CHLORIDE 0.9 % IV SOLN
Freq: Once | INTRAVENOUS | Status: AC
  Administered 2016-05-03: 10:00:00 via INTRAVENOUS

## 2016-05-03 NOTE — Patient Instructions (Signed)

## 2016-05-04 ENCOUNTER — Telehealth: Payer: Self-pay | Admitting: *Deleted

## 2016-05-04 NOTE — Telephone Encounter (Addendum)
Patient is doing well. He has no complaints of nausea/vomiting. He has no symptoms except some constipation which was relieved with warm prune juice and miralax this morning.  Wife states they have all the prescribed prn medication at home in case he needs it. She knows to call the on-call service this weekend, or office next week if she has any concerns.  ----- Message from Rico Ala, RN sent at 05/02/2016 12:27 PM EDT ----- Regarding: Chemo follow up call Please do chemo follow up call on 05/04/2016

## 2016-05-08 ENCOUNTER — Emergency Department (HOSPITAL_COMMUNITY): Payer: Medicare Other

## 2016-05-08 ENCOUNTER — Inpatient Hospital Stay (HOSPITAL_COMMUNITY)
Admission: EM | Admit: 2016-05-08 | Discharge: 2016-05-26 | DRG: 853 | Disposition: E | Payer: Medicare Other | Attending: Internal Medicine | Admitting: Internal Medicine

## 2016-05-08 ENCOUNTER — Inpatient Hospital Stay (HOSPITAL_COMMUNITY): Payer: Medicare Other | Admitting: Certified Registered Nurse Anesthetist

## 2016-05-08 ENCOUNTER — Encounter (HOSPITAL_COMMUNITY): Payer: Self-pay | Admitting: Family Medicine

## 2016-05-08 ENCOUNTER — Inpatient Hospital Stay (HOSPITAL_COMMUNITY): Payer: Medicare Other

## 2016-05-08 ENCOUNTER — Encounter (HOSPITAL_COMMUNITY): Admission: EM | Disposition: E | Payer: Self-pay | Source: Home / Self Care | Attending: Internal Medicine

## 2016-05-08 DIAGNOSIS — N179 Acute kidney failure, unspecified: Secondary | ICD-10-CM | POA: Diagnosis present

## 2016-05-08 DIAGNOSIS — K5900 Constipation, unspecified: Secondary | ICD-10-CM

## 2016-05-08 DIAGNOSIS — Z96651 Presence of right artificial knee joint: Secondary | ICD-10-CM | POA: Diagnosis present

## 2016-05-08 DIAGNOSIS — Z87442 Personal history of urinary calculi: Secondary | ICD-10-CM | POA: Diagnosis not present

## 2016-05-08 DIAGNOSIS — I5030 Unspecified diastolic (congestive) heart failure: Secondary | ICD-10-CM | POA: Diagnosis present

## 2016-05-08 DIAGNOSIS — E785 Hyperlipidemia, unspecified: Secondary | ICD-10-CM | POA: Diagnosis present

## 2016-05-08 DIAGNOSIS — R6521 Severe sepsis with septic shock: Secondary | ICD-10-CM | POA: Diagnosis present

## 2016-05-08 DIAGNOSIS — Z951 Presence of aortocoronary bypass graft: Secondary | ICD-10-CM | POA: Diagnosis not present

## 2016-05-08 DIAGNOSIS — E872 Acidosis, unspecified: Secondary | ICD-10-CM | POA: Diagnosis present

## 2016-05-08 DIAGNOSIS — E871 Hypo-osmolality and hyponatremia: Secondary | ICD-10-CM | POA: Diagnosis present

## 2016-05-08 DIAGNOSIS — C9110 Chronic lymphocytic leukemia of B-cell type not having achieved remission: Secondary | ICD-10-CM | POA: Diagnosis present

## 2016-05-08 DIAGNOSIS — I2510 Atherosclerotic heart disease of native coronary artery without angina pectoris: Secondary | ICD-10-CM | POA: Diagnosis present

## 2016-05-08 DIAGNOSIS — Z85828 Personal history of other malignant neoplasm of skin: Secondary | ICD-10-CM | POA: Diagnosis not present

## 2016-05-08 DIAGNOSIS — I5032 Chronic diastolic (congestive) heart failure: Secondary | ICD-10-CM | POA: Diagnosis present

## 2016-05-08 DIAGNOSIS — K668 Other specified disorders of peritoneum: Secondary | ICD-10-CM | POA: Diagnosis present

## 2016-05-08 DIAGNOSIS — M109 Gout, unspecified: Secondary | ICD-10-CM | POA: Diagnosis present

## 2016-05-08 DIAGNOSIS — C786 Secondary malignant neoplasm of retroperitoneum and peritoneum: Secondary | ICD-10-CM | POA: Diagnosis present

## 2016-05-08 DIAGNOSIS — A419 Sepsis, unspecified organism: Principal | ICD-10-CM | POA: Diagnosis present

## 2016-05-08 DIAGNOSIS — Z8249 Family history of ischemic heart disease and other diseases of the circulatory system: Secondary | ICD-10-CM

## 2016-05-08 DIAGNOSIS — N358 Other urethral stricture: Secondary | ICD-10-CM | POA: Diagnosis present

## 2016-05-08 DIAGNOSIS — J449 Chronic obstructive pulmonary disease, unspecified: Secondary | ICD-10-CM | POA: Diagnosis present

## 2016-05-08 DIAGNOSIS — R188 Other ascites: Secondary | ICD-10-CM | POA: Diagnosis present

## 2016-05-08 DIAGNOSIS — L8990 Pressure ulcer of unspecified site, unspecified stage: Secondary | ICD-10-CM | POA: Insufficient documentation

## 2016-05-08 DIAGNOSIS — E883 Tumor lysis syndrome: Secondary | ICD-10-CM

## 2016-05-08 DIAGNOSIS — Z91041 Radiographic dye allergy status: Secondary | ICD-10-CM | POA: Diagnosis not present

## 2016-05-08 DIAGNOSIS — Z79899 Other long term (current) drug therapy: Secondary | ICD-10-CM | POA: Diagnosis not present

## 2016-05-08 DIAGNOSIS — Z7952 Long term (current) use of systemic steroids: Secondary | ICD-10-CM

## 2016-05-08 DIAGNOSIS — E875 Hyperkalemia: Secondary | ICD-10-CM | POA: Diagnosis present

## 2016-05-08 DIAGNOSIS — Z8673 Personal history of transient ischemic attack (TIA), and cerebral infarction without residual deficits: Secondary | ICD-10-CM

## 2016-05-08 DIAGNOSIS — R109 Unspecified abdominal pain: Secondary | ICD-10-CM

## 2016-05-08 DIAGNOSIS — C7A1 Malignant poorly differentiated neuroendocrine tumors: Secondary | ICD-10-CM

## 2016-05-08 DIAGNOSIS — Z8551 Personal history of malignant neoplasm of bladder: Secondary | ICD-10-CM

## 2016-05-08 DIAGNOSIS — Z515 Encounter for palliative care: Secondary | ICD-10-CM | POA: Diagnosis present

## 2016-05-08 DIAGNOSIS — R0602 Shortness of breath: Secondary | ICD-10-CM | POA: Diagnosis present

## 2016-05-08 DIAGNOSIS — N183 Chronic kidney disease, stage 3 unspecified: Secondary | ICD-10-CM | POA: Diagnosis present

## 2016-05-08 DIAGNOSIS — H9190 Unspecified hearing loss, unspecified ear: Secondary | ICD-10-CM | POA: Diagnosis present

## 2016-05-08 DIAGNOSIS — K55022 Diffuse acute infarction of small intestine: Secondary | ICD-10-CM | POA: Diagnosis present

## 2016-05-08 DIAGNOSIS — Z66 Do not resuscitate: Secondary | ICD-10-CM

## 2016-05-08 DIAGNOSIS — G510 Bell's palsy: Secondary | ICD-10-CM | POA: Diagnosis present

## 2016-05-08 DIAGNOSIS — K219 Gastro-esophageal reflux disease without esophagitis: Secondary | ICD-10-CM | POA: Diagnosis present

## 2016-05-08 DIAGNOSIS — Z823 Family history of stroke: Secondary | ICD-10-CM | POA: Diagnosis not present

## 2016-05-08 DIAGNOSIS — N400 Enlarged prostate without lower urinary tract symptoms: Secondary | ICD-10-CM | POA: Diagnosis present

## 2016-05-08 DIAGNOSIS — D499 Neoplasm of unspecified behavior of unspecified site: Secondary | ICD-10-CM | POA: Diagnosis present

## 2016-05-08 DIAGNOSIS — K659 Peritonitis, unspecified: Secondary | ICD-10-CM | POA: Diagnosis present

## 2016-05-08 DIAGNOSIS — J9601 Acute respiratory failure with hypoxia: Secondary | ICD-10-CM | POA: Diagnosis present

## 2016-05-08 DIAGNOSIS — I130 Hypertensive heart and chronic kidney disease with heart failure and stage 1 through stage 4 chronic kidney disease, or unspecified chronic kidney disease: Secondary | ICD-10-CM | POA: Diagnosis present

## 2016-05-08 HISTORY — DX: Unspecified malignant neoplasm of skin, unspecified: C44.90

## 2016-05-08 HISTORY — PX: CYSTOSCOPY WITH URETHRAL DILATATION: SHX5125

## 2016-05-08 HISTORY — PX: LAPAROTOMY: SHX154

## 2016-05-08 LAB — POCT I-STAT EG7
Acid-base deficit: 12 mmol/L — ABNORMAL HIGH (ref 0.0–2.0)
Bicarbonate: 15 mEq/L — ABNORMAL LOW (ref 20.0–24.0)
Calcium, Ion: 1.1 mmol/L — ABNORMAL LOW (ref 1.13–1.30)
HCT: 16 % — ABNORMAL LOW (ref 39.0–52.0)
HEMOGLOBIN: 5.4 g/dL — AB (ref 13.0–17.0)
O2 SAT: 100 %
PCO2 VEN: 35.1 mmHg — AB (ref 45.0–50.0)
PH VEN: 7.223 — AB (ref 7.250–7.300)
POTASSIUM: 4.6 mmol/L (ref 3.5–5.1)
Sodium: 137 mmol/L (ref 135–145)
TCO2: 16 mmol/L (ref 0–100)
pO2, Ven: 295 mmHg — ABNORMAL HIGH (ref 31.0–45.0)

## 2016-05-08 LAB — POCT I-STAT 7, (LYTES, BLD GAS, ICA,H+H)
ACID-BASE DEFICIT: 18 mmol/L — AB (ref 0.0–2.0)
Acid-base deficit: 16 mmol/L — ABNORMAL HIGH (ref 0.0–2.0)
Bicarbonate: 7.4 mEq/L — ABNORMAL LOW (ref 20.0–24.0)
Bicarbonate: 11.7 mEq/L — ABNORMAL LOW (ref 20.0–24.0)
CALCIUM ION: 1.27 mmol/L (ref 1.13–1.30)
Calcium, Ion: 1.17 mmol/L (ref 1.13–1.30)
HCT: 25 % — ABNORMAL LOW (ref 39.0–52.0)
HCT: 22 % — ABNORMAL LOW (ref 39.0–52.0)
HEMOGLOBIN: 8.5 g/dL — AB (ref 13.0–17.0)
HEMOGLOBIN: 7.5 g/dL — AB (ref 13.0–17.0)
O2 SAT: 93 %
O2 Saturation: 100 %
PCO2 ART: 18 mmHg — AB (ref 35.0–45.0)
PCO2 ART: 31.8 mmHg — AB (ref 35.0–45.0)
PH ART: 7.221 — AB (ref 7.350–7.450)
PO2 ART: 299 mmHg — AB (ref 80.0–100.0)
POTASSIUM: 5.4 mmol/L — AB (ref 3.5–5.1)
POTASSIUM: 4.9 mmol/L (ref 3.5–5.1)
Sodium: 129 mmol/L — ABNORMAL LOW (ref 135–145)
Sodium: 134 mmol/L — ABNORMAL LOW (ref 135–145)
TCO2: 8 mmol/L (ref 0–100)
TCO2: 13 mmol/L (ref 0–100)
pH, Arterial: 7.157 — CL (ref 7.350–7.450)
pO2, Arterial: 79 mmHg — ABNORMAL LOW (ref 80.0–100.0)

## 2016-05-08 LAB — BLOOD GAS, ARTERIAL
Acid-base deficit: 11.3 mmol/L — ABNORMAL HIGH (ref 0.0–2.0)
Bicarbonate: 12.5 mEq/L — ABNORMAL LOW (ref 20.0–24.0)
Drawn by: 11249
O2 Content: 2 L/min
O2 SAT: 93.8 %
PCO2 ART: 22.4 mmHg — AB (ref 35.0–45.0)
PH ART: 7.361 (ref 7.350–7.450)
PO2 ART: 72.5 mmHg — AB (ref 80.0–100.0)
Patient temperature: 97.3
TCO2: 11.5 mmol/L (ref 0–100)

## 2016-05-08 LAB — URINALYSIS, ROUTINE W REFLEX MICROSCOPIC
BILIRUBIN URINE: NEGATIVE
Glucose, UA: 100 mg/dL — AB
KETONES UR: NEGATIVE mg/dL
Leukocytes, UA: NEGATIVE
NITRITE: NEGATIVE
Protein, ur: NEGATIVE mg/dL
Specific Gravity, Urine: 1.018 (ref 1.005–1.030)
pH: 5 (ref 5.0–8.0)

## 2016-05-08 LAB — LIPASE, BLOOD: Lipase: 63 U/L — ABNORMAL HIGH (ref 11–51)

## 2016-05-08 LAB — COMPREHENSIVE METABOLIC PANEL
ALT: 29 U/L (ref 17–63)
AST: 25 U/L (ref 15–41)
Albumin: 2.8 g/dL — ABNORMAL LOW (ref 3.5–5.0)
Alkaline Phosphatase: 100 U/L (ref 38–126)
Anion gap: 15 (ref 5–15)
BILIRUBIN TOTAL: 0.5 mg/dL (ref 0.3–1.2)
BUN: 76 mg/dL — ABNORMAL HIGH (ref 6–20)
CHLORIDE: 101 mmol/L (ref 101–111)
CO2: 12 mmol/L — ABNORMAL LOW (ref 22–32)
Calcium: 10.1 mg/dL (ref 8.9–10.3)
Creatinine, Ser: 1.93 mg/dL — ABNORMAL HIGH (ref 0.61–1.24)
GFR, EST AFRICAN AMERICAN: 36 mL/min — AB (ref >60)
GFR, EST NON AFRICAN AMERICAN: 31 mL/min — AB (ref >60)
Glucose, Bld: 221 mg/dL — ABNORMAL HIGH (ref 65–99)
POTASSIUM: 5.3 mmol/L — AB (ref 3.5–5.1)
Sodium: 128 mmol/L — ABNORMAL LOW (ref 135–145)
Total Protein: 5.3 g/dL — ABNORMAL LOW (ref 6.5–8.1)

## 2016-05-08 LAB — URINE MICROSCOPIC-ADD ON
BACTERIA UA: NONE SEEN
SQUAMOUS EPITHELIAL / LPF: NONE SEEN
WBC UA: NONE SEEN WBC/hpf (ref 0–5)

## 2016-05-08 LAB — LACTIC ACID, PLASMA
LACTIC ACID, VENOUS: 5.5 mmol/L — AB (ref 0.5–2.0)
LACTIC ACID, VENOUS: 7.1 mmol/L — AB (ref 0.5–2.0)

## 2016-05-08 LAB — CBC
HCT: 31.8 % — ABNORMAL LOW (ref 39.0–52.0)
Hemoglobin: 10.8 g/dL — ABNORMAL LOW (ref 13.0–17.0)
MCH: 28 pg (ref 26.0–34.0)
MCHC: 34 g/dL (ref 30.0–36.0)
MCV: 82.4 fL (ref 78.0–100.0)
PLATELETS: 401 10*3/uL — AB (ref 150–400)
RBC: 3.86 MIL/uL — ABNORMAL LOW (ref 4.22–5.81)
RDW: 17.1 % — AB (ref 11.5–15.5)
WBC: 3.7 10*3/uL — ABNORMAL LOW (ref 4.0–10.5)

## 2016-05-08 LAB — I-STAT TROPONIN, ED: TROPONIN I, POC: 0.06 ng/mL (ref 0.00–0.08)

## 2016-05-08 LAB — I-STAT CG4 LACTIC ACID, ED: LACTIC ACID, VENOUS: 4.38 mmol/L — AB (ref 0.5–2.0)

## 2016-05-08 LAB — URIC ACID: Uric Acid, Serum: 15.6 mg/dL — ABNORMAL HIGH (ref 4.4–7.6)

## 2016-05-08 LAB — PROCALCITONIN: Procalcitonin: 14.21 ng/mL

## 2016-05-08 LAB — GLUCOSE, CAPILLARY: Glucose-Capillary: 160 mg/dL — ABNORMAL HIGH (ref 65–99)

## 2016-05-08 LAB — PROTIME-INR
INR: 1.41 (ref 0.00–1.49)
PROTHROMBIN TIME: 16.8 s — AB (ref 11.6–15.2)

## 2016-05-08 LAB — MRSA PCR SCREENING: MRSA BY PCR: NEGATIVE

## 2016-05-08 LAB — APTT: aPTT: 43 seconds — ABNORMAL HIGH (ref 24–37)

## 2016-05-08 SURGERY — LAPAROTOMY, EXPLORATORY
Anesthesia: General | Site: Urethra

## 2016-05-08 MED ORDER — OMEGA-3-ACID ETHYL ESTERS 1 G PO CAPS
1.0000 g | ORAL_CAPSULE | Freq: Every day | ORAL | Status: DC
  Filled 2016-05-08: qty 1

## 2016-05-08 MED ORDER — TAMSULOSIN HCL 0.4 MG PO CAPS: 0.4000 mg | ORAL_CAPSULE | Freq: Every day | ORAL | Status: DC | PRN

## 2016-05-08 MED ORDER — ACETAMINOPHEN 650 MG RE SUPP: 650.0000 mg | Freq: Four times a day (QID) | RECTAL | Status: DC | PRN

## 2016-05-08 MED ORDER — ETOMIDATE 2 MG/ML IV SOLN
INTRAVENOUS | Status: AC
  Filled 2016-05-08: qty 10

## 2016-05-08 MED ORDER — ETOMIDATE 2 MG/ML IV SOLN
INTRAVENOUS | Status: DC | PRN
  Administered 2016-05-08: 14 mg via INTRAVENOUS

## 2016-05-08 MED ORDER — ONDANSETRON 4 MG PO TBDP
4.0000 mg | ORAL_TABLET | Freq: Four times a day (QID) | ORAL | Status: DC | PRN
  Filled 2016-05-08: qty 1

## 2016-05-08 MED ORDER — ONDANSETRON HCL 4 MG/2ML IJ SOLN
INTRAMUSCULAR | Status: AC
  Filled 2016-05-08: qty 2

## 2016-05-08 MED ORDER — HEPARIN (PORCINE) IN NACL 100-0.45 UNIT/ML-% IJ SOLN
1200.0000 [IU]/h | INTRAMUSCULAR | Status: DC
  Administered 2016-05-08: 1200 [IU]/h via INTRAVENOUS
  Filled 2016-05-08 (×2): qty 250

## 2016-05-08 MED ORDER — LIDOCAINE HCL (CARDIAC) 20 MG/ML IV SOLN
INTRAVENOUS | Status: DC | PRN
  Administered 2016-05-08: 100 mg via INTRAVENOUS

## 2016-05-08 MED ORDER — PIPERACILLIN-TAZOBACTAM 3.375 G IVPB: 3.3750 g | Freq: Three times a day (TID) | INTRAVENOUS | Status: DC

## 2016-05-08 MED ORDER — ROCURONIUM BROMIDE 100 MG/10ML IV SOLN
INTRAVENOUS | Status: AC
  Filled 2016-05-08: qty 1

## 2016-05-08 MED ORDER — LIDOCAINE HCL (CARDIAC) 20 MG/ML IV SOLN
INTRAVENOUS | Status: AC
  Filled 2016-05-08: qty 5

## 2016-05-08 MED ORDER — IPRATROPIUM-ALBUTEROL 0.5-2.5 (3) MG/3ML IN SOLN: 3.0000 mL | RESPIRATORY_TRACT | Status: DC | PRN

## 2016-05-08 MED ORDER — HYDROMORPHONE HCL 1 MG/ML IJ SOLN
0.5000 mg | INTRAMUSCULAR | Status: DC | PRN
  Administered 2016-05-08: 0.5 mg via INTRAVENOUS
  Filled 2016-05-08: qty 1

## 2016-05-08 MED ORDER — ACETAMINOPHEN 325 MG PO TABS: 650.0000 mg | ORAL_TABLET | Freq: Four times a day (QID) | ORAL | Status: DC | PRN

## 2016-05-08 MED ORDER — SODIUM CHLORIDE 0.9 % IV BOLUS (SEPSIS)
500.0000 mL | Freq: Once | INTRAVENOUS | Status: AC
  Administered 2016-05-08: 500 mL via INTRAVENOUS

## 2016-05-08 MED ORDER — HYDROCORTISONE NA SUCCINATE PF 100 MG IJ SOLR
100.0000 mg | Freq: Three times a day (TID) | INTRAMUSCULAR | Status: DC
  Administered 2016-05-08: 100 mg via INTRAVENOUS
  Filled 2016-05-08 (×5): qty 2

## 2016-05-08 MED ORDER — SUCCINYLCHOLINE CHLORIDE 20 MG/ML IJ SOLN
INTRAMUSCULAR | Status: DC | PRN
  Administered 2016-05-08: 100 mg via INTRAVENOUS

## 2016-05-08 MED ORDER — FENTANYL CITRATE (PF) 250 MCG/5ML IJ SOLN
INTRAMUSCULAR | Status: AC
  Filled 2016-05-08: qty 5

## 2016-05-08 MED ORDER — PHENYLEPHRINE HCL 10 MG/ML IJ SOLN
10.0000 mg | INTRAVENOUS | Status: DC | PRN
  Administered 2016-05-08: 100 ug/min via INTRAVENOUS

## 2016-05-08 MED ORDER — HALOPERIDOL 0.5 MG PO TABS
0.5000 mg | ORAL_TABLET | ORAL | Status: DC | PRN
  Filled 2016-05-08: qty 1

## 2016-05-08 MED ORDER — SODIUM CHLORIDE 0.9 % IV SOLN
INTRAVENOUS | Status: DC
  Administered 2016-05-08: 18:00:00 via INTRAVENOUS

## 2016-05-08 MED ORDER — GLYCOPYRROLATE 1 MG PO TABS
1.0000 mg | ORAL_TABLET | ORAL | Status: DC | PRN
  Filled 2016-05-08: qty 1

## 2016-05-08 MED ORDER — SODIUM BICARBONATE 8.4 % IV SOLN
INTRAVENOUS | Status: AC
  Filled 2016-05-08: qty 50

## 2016-05-08 MED ORDER — SODIUM CHLORIDE 0.9 % IR SOLN
Status: DC | PRN
  Administered 2016-05-08: 2000 mL
  Administered 2016-05-08: 1000 mL via INTRAVESICAL

## 2016-05-08 MED ORDER — GLYCOPYRROLATE 0.2 MG/ML IJ SOLN
0.2000 mg | INTRAMUSCULAR | Status: DC | PRN
  Filled 2016-05-08: qty 1

## 2016-05-08 MED ORDER — HYDROMORPHONE HCL 2 MG/ML IJ SOLN
INTRAMUSCULAR | Status: AC
  Filled 2016-05-08: qty 1

## 2016-05-08 MED ORDER — SODIUM CHLORIDE 0.9 % IV SOLN
INTRAVENOUS | Status: DC | PRN
Start: 2016-05-08 — End: 2016-05-08
  Administered 2016-05-08: 14:00:00 via INTRAVENOUS

## 2016-05-08 MED ORDER — SODIUM CHLORIDE 0.9 % IV SOLN
25.0000 ug/h | INTRAVENOUS | Status: DC
  Administered 2016-05-08: 25 ug/h via INTRAVENOUS
  Filled 2016-05-08: qty 50

## 2016-05-08 MED ORDER — POLYVINYL ALCOHOL 1.4 % OP SOLN: 1.0000 [drp] | Freq: Four times a day (QID) | OPHTHALMIC | Status: DC | PRN

## 2016-05-08 MED ORDER — LACTATED RINGERS IV SOLN
INTRAVENOUS | Status: DC | PRN
  Administered 2016-05-08 (×2): via INTRAVENOUS

## 2016-05-08 MED ORDER — SODIUM BICARBONATE 8.4 % IV SOLN
INTRAVENOUS | Status: AC
  Filled 2016-05-08: qty 150

## 2016-05-08 MED ORDER — HALOPERIDOL LACTATE 2 MG/ML PO CONC
0.5000 mg | ORAL | Status: DC | PRN
  Filled 2016-05-08: qty 0.3

## 2016-05-08 MED ORDER — ENALAPRIL MALEATE 10 MG PO TABS: 10.0000 mg | ORAL_TABLET | Freq: Every day | ORAL | Status: DC

## 2016-05-08 MED ORDER — FENTANYL CITRATE (PF) 100 MCG/2ML IJ SOLN
12.5000 ug | INTRAMUSCULAR | Status: DC | PRN
  Administered 2016-05-08: 12.5 ug via INTRAVENOUS
  Filled 2016-05-08: qty 2

## 2016-05-08 MED ORDER — FENTANYL CITRATE (PF) 100 MCG/2ML IJ SOLN
INTRAMUSCULAR | Status: DC | PRN
  Administered 2016-05-08 (×5): 50 ug via INTRAVENOUS

## 2016-05-08 MED ORDER — METOPROLOL TARTRATE 25 MG PO TABS
25.0000 mg | ORAL_TABLET | Freq: Two times a day (BID) | ORAL | Status: DC
  Filled 2016-05-08 (×2): qty 1

## 2016-05-08 MED ORDER — FENTANYL CITRATE (PF) 100 MCG/2ML IJ SOLN: 25.0000 ug | INTRAMUSCULAR | Status: DC | PRN

## 2016-05-08 MED ORDER — POLYETHYLENE GLYCOL 3350 17 G PO PACK
17.0000 g | PACK | Freq: Three times a day (TID) | ORAL | Status: DC
  Filled 2016-05-08 (×3): qty 1

## 2016-05-08 MED ORDER — VALACYCLOVIR HCL 500 MG PO TABS
500.0000 mg | ORAL_TABLET | Freq: Every day | ORAL | Status: DC
  Filled 2016-05-08: qty 1

## 2016-05-08 MED ORDER — LORAZEPAM 1 MG PO TABS: 1.0000 mg | ORAL_TABLET | ORAL | Status: DC | PRN

## 2016-05-08 MED ORDER — TETRAHYDROZOLINE HCL 0.05 % OP SOLN
2.0000 [drp] | Freq: Every day | OPHTHALMIC | Status: DC
  Filled 2016-05-08: qty 15

## 2016-05-08 MED ORDER — PIPERACILLIN-TAZOBACTAM 3.375 G IVPB 30 MIN
3.3750 g | Freq: Once | INTRAVENOUS | Status: AC
  Administered 2016-05-08: 3.375 g via INTRAVENOUS
  Filled 2016-05-08 (×2): qty 50

## 2016-05-08 MED ORDER — LACTATED RINGERS IV SOLN
INTRAVENOUS | Status: DC
  Administered 2016-05-08: 18:00:00 via INTRAVENOUS

## 2016-05-08 MED ORDER — ROCURONIUM BROMIDE 100 MG/10ML IV SOLN
INTRAVENOUS | Status: DC | PRN
  Administered 2016-05-08 (×2): 50 mg via INTRAVENOUS

## 2016-05-08 MED ORDER — PHENYLEPHRINE HCL 10 MG/ML IJ SOLN
INTRAMUSCULAR | Status: AC
  Filled 2016-05-08: qty 2

## 2016-05-08 MED ORDER — HEPARIN BOLUS VIA INFUSION
3000.0000 [IU] | Freq: Once | INTRAVENOUS | Status: AC
  Administered 2016-05-08: 3000 [IU] via INTRAVENOUS
  Filled 2016-05-08: qty 3000

## 2016-05-08 MED ORDER — AMLODIPINE BESYLATE 5 MG PO TABS: 5.0000 mg | ORAL_TABLET | Freq: Every day | ORAL | Status: DC

## 2016-05-08 MED ORDER — SODIUM BICARBONATE 8.4 % IV SOLN
INTRAVENOUS | Status: DC
  Administered 2016-05-08: 10:00:00 via INTRAVENOUS
  Filled 2016-05-08: qty 150

## 2016-05-08 MED ORDER — SODIUM BICARBONATE 8.4 % IV SOLN
INTRAVENOUS | Status: DC | PRN
  Administered 2016-05-08 (×4): 50 mL via INTRAVENOUS

## 2016-05-08 MED ORDER — LORAZEPAM 2 MG/ML PO CONC: 1.0000 mg | ORAL | Status: DC | PRN

## 2016-05-08 MED ORDER — BIOTENE DRY MOUTH MT LIQD: 15.0000 mL | OROMUCOSAL | Status: DC | PRN

## 2016-05-08 MED ORDER — SUGAMMADEX SODIUM 200 MG/2ML IV SOLN
INTRAVENOUS | Status: AC
  Filled 2016-05-08: qty 2

## 2016-05-08 MED ORDER — IPRATROPIUM-ALBUTEROL 0.5-2.5 (3) MG/3ML IN SOLN
3.0000 mL | Freq: Four times a day (QID) | RESPIRATORY_TRACT | Status: DC
  Administered 2016-05-08: 3 mL via RESPIRATORY_TRACT
  Filled 2016-05-08: qty 3

## 2016-05-08 MED ORDER — NITROGLYCERIN 0.4 MG SL SUBL: 0.4000 mg | SUBLINGUAL_TABLET | SUBLINGUAL | Status: DC | PRN

## 2016-05-08 MED ORDER — FAMCICLOVIR 500 MG PO TABS: 250.0000 mg | ORAL_TABLET | Freq: Every day | ORAL | Status: DC

## 2016-05-08 MED ORDER — PHENYLEPHRINE HCL 10 MG/ML IJ SOLN
INTRAMUSCULAR | Status: AC
  Filled 2016-05-08: qty 1

## 2016-05-08 MED ORDER — PANTOPRAZOLE SODIUM 20 MG PO TBEC
20.0000 mg | DELAYED_RELEASE_TABLET | Freq: Every day | ORAL | Status: DC
  Filled 2016-05-08: qty 1

## 2016-05-08 MED ORDER — HALOPERIDOL LACTATE 5 MG/ML IJ SOLN: 0.5000 mg | INTRAMUSCULAR | Status: DC | PRN

## 2016-05-08 MED ORDER — ALBUTEROL SULFATE (2.5 MG/3ML) 0.083% IN NEBU
5.0000 mg | INHALATION_SOLUTION | Freq: Once | RESPIRATORY_TRACT | Status: AC
  Administered 2016-05-08: 5 mg via RESPIRATORY_TRACT
  Filled 2016-05-08: qty 6

## 2016-05-08 MED ORDER — LORAZEPAM 2 MG/ML IJ SOLN: 2.0000 mg | INTRAMUSCULAR | Status: DC | PRN

## 2016-05-08 MED ORDER — ALLOPURINOL 100 MG PO TABS
100.0000 mg | ORAL_TABLET | Freq: Every day | ORAL | Status: DC | PRN
  Filled 2016-05-08: qty 1

## 2016-05-08 MED ORDER — SODIUM CHLORIDE 0.9 % IV SOLN: 6.0000 mg | Freq: Once | INTRAVENOUS | Status: DC

## 2016-05-08 MED ORDER — METOPROLOL TARTRATE 25 MG PO TABS: 25.0000 mg | ORAL_TABLET | Freq: Two times a day (BID) | ORAL | Status: DC

## 2016-05-08 MED ORDER — ONDANSETRON HCL 4 MG/2ML IJ SOLN: 4.0000 mg | Freq: Four times a day (QID) | INTRAMUSCULAR | Status: DC | PRN

## 2016-05-08 MED ORDER — SODIUM CHLORIDE 0.9% FLUSH
3.0000 mL | Freq: Two times a day (BID) | INTRAVENOUS | Status: DC
  Administered 2016-05-08: 3 mL via INTRAVENOUS

## 2016-05-08 MED ORDER — IPRATROPIUM-ALBUTEROL 0.5-2.5 (3) MG/3ML IN SOLN: 3.0000 mL | Freq: Four times a day (QID) | RESPIRATORY_TRACT | Status: DC

## 2016-05-08 MED ORDER — SODIUM CHLORIDE 0.9 % IV SOLN
INTRAVENOUS | Status: DC
  Administered 2016-05-08: 10:00:00 via INTRAVENOUS

## 2016-05-08 MED ORDER — SODIUM CHLORIDE 0.9 % IV SOLN: 10.0000 ug/h | INTRAVENOUS | Status: DC

## 2016-05-08 MED ORDER — FENTANYL CITRATE (PF) 100 MCG/2ML IJ SOLN
50.0000 ug | Freq: Once | INTRAMUSCULAR | Status: AC
  Administered 2016-05-08: 50 ug via INTRAVENOUS
  Filled 2016-05-08: qty 2

## 2016-05-08 SURGICAL SUPPLY — 52 items
APPLICATOR COTTON TIP 6IN STRL (MISCELLANEOUS) IMPLANT
BAG URO CATCHER STRL LF (MISCELLANEOUS) IMPLANT
BASKET ZERO TIP NITINOL 2.4FR (BASKET) IMPLANT
BLADE EXTENDED COATED 6.5IN (ELECTRODE) ×3 IMPLANT
BLADE HEX COATED 2.75 (ELECTRODE) IMPLANT
CATH INTERMIT  6FR 70CM (CATHETERS) IMPLANT
CLOTH BEACON ORANGE TIMEOUT ST (SAFETY) ×3 IMPLANT
CONT SPEC 4OZ CLIKSEAL STRL BL (MISCELLANEOUS) ×3 IMPLANT
COVER MAYO STAND STRL (DRAPES) ×3 IMPLANT
COVER SURGICAL LIGHT HANDLE (MISCELLANEOUS) ×3 IMPLANT
DRAPE LAPAROSCOPIC ABDOMINAL (DRAPES) ×3 IMPLANT
DRAPE WARM FLUID 44X44 (DRAPE) ×3 IMPLANT
ELECT REM PT RETURN 9FT ADLT (ELECTROSURGICAL) ×3
ELECTRODE REM PT RTRN 9FT ADLT (ELECTROSURGICAL) ×2 IMPLANT
GAUZE SPONGE 4X4 12PLY STRL (GAUZE/BANDAGES/DRESSINGS) ×3 IMPLANT
GLOVE BIOGEL M STRL SZ7.5 (GLOVE) ×3 IMPLANT
GLOVE BIOGEL PI IND STRL 7.0 (GLOVE) ×2 IMPLANT
GLOVE BIOGEL PI INDICATOR 7.0 (GLOVE) ×1
GLOVE SURG SIGNA 7.5 PF LTX (GLOVE) ×3 IMPLANT
GOWN SPEC L4 XLG W/TWL (GOWN DISPOSABLE) ×3 IMPLANT
GOWN STRL REUS W/ TWL XL LVL3 (GOWN DISPOSABLE) ×6 IMPLANT
GOWN STRL REUS W/TWL LRG LVL3 (GOWN DISPOSABLE) ×9 IMPLANT
GOWN STRL REUS W/TWL XL LVL3 (GOWN DISPOSABLE) ×3
GUIDEWIRE ANG ZIPWIRE 038X150 (WIRE) IMPLANT
GUIDEWIRE STR DUAL SENSOR (WIRE) ×3 IMPLANT
HANDLE SUCTION POOLE (INSTRUMENTS) IMPLANT
KIT BASIN OR (CUSTOM PROCEDURE TRAY) ×3 IMPLANT
LIGASURE IMPACT 36 18CM CVD LR (INSTRUMENTS) ×3 IMPLANT
MANIFOLD NEPTUNE II (INSTRUMENTS) ×3 IMPLANT
NS IRRIG 1000ML POUR BTL (IV SOLUTION) ×3 IMPLANT
PACK CYSTO (CUSTOM PROCEDURE TRAY) ×3 IMPLANT
PACK GENERAL/GYN (CUSTOM PROCEDURE TRAY) ×3 IMPLANT
SPONGE LAP 18X18 X RAY DECT (DISPOSABLE) IMPLANT
STAPLER VISISTAT 35W (STAPLE) ×3 IMPLANT
SUCTION POOLE HANDLE (INSTRUMENTS)
SUT NOV 1 T60/GS (SUTURE) ×6 IMPLANT
SUT PDS AB 1 CTX 36 (SUTURE) IMPLANT
SUT SILK 2 0 (SUTURE)
SUT SILK 2 0 SH CR/8 (SUTURE) IMPLANT
SUT SILK 2-0 18XBRD TIE 12 (SUTURE) IMPLANT
SUT SILK 3 0 (SUTURE)
SUT SILK 3 0 SH CR/8 (SUTURE) ×3 IMPLANT
SUT SILK 3-0 18XBRD TIE 12 (SUTURE) IMPLANT
SUT VIC AB 2-0 SH 18 (SUTURE) ×3 IMPLANT
SUT VICRYL 2 0 18  UND BR (SUTURE)
SUT VICRYL 2 0 18 UND BR (SUTURE) IMPLANT
TAPE CLOTH SURG 4X10 WHT LF (GAUZE/BANDAGES/DRESSINGS) ×3 IMPLANT
TOWEL OR 17X26 10 PK STRL BLUE (TOWEL DISPOSABLE) ×6 IMPLANT
TRAY FOLEY W/METER SILVER 16FR (SET/KITS/TRAYS/PACK) IMPLANT
TUBING CONNECTING 10 (TUBING) ×3 IMPLANT
WATER STERILE IRR 1500ML POUR (IV SOLUTION) ×3 IMPLANT
YANKAUER SUCT BULB TIP NO VENT (SUCTIONS) IMPLANT

## 2016-05-09 ENCOUNTER — Encounter (HOSPITAL_COMMUNITY): Payer: Self-pay | Admitting: Surgery

## 2016-05-09 LAB — BLOOD CULTURE ID PANEL (REFLEXED)
Acinetobacter baumannii: NOT DETECTED
CANDIDA ALBICANS: NOT DETECTED
CANDIDA PARAPSILOSIS: NOT DETECTED
CANDIDA TROPICALIS: NOT DETECTED
CARBAPENEM RESISTANCE: NOT DETECTED
Candida glabrata: NOT DETECTED
Candida krusei: NOT DETECTED
ENTEROBACTER CLOACAE COMPLEX: NOT DETECTED
ENTEROBACTERIACEAE SPECIES: DETECTED — AB
Enterococcus species: NOT DETECTED
Escherichia coli: NOT DETECTED
HAEMOPHILUS INFLUENZAE: NOT DETECTED
KLEBSIELLA PNEUMONIAE: DETECTED — AB
Klebsiella oxytoca: NOT DETECTED
Listeria monocytogenes: NOT DETECTED
METHICILLIN RESISTANCE: NOT DETECTED
Neisseria meningitidis: NOT DETECTED
PROTEUS SPECIES: NOT DETECTED
PSEUDOMONAS AERUGINOSA: NOT DETECTED
STAPHYLOCOCCUS AUREUS BCID: NOT DETECTED
STAPHYLOCOCCUS SPECIES: NOT DETECTED
STREPTOCOCCUS PNEUMONIAE: NOT DETECTED
STREPTOCOCCUS PYOGENES: NOT DETECTED
Serratia marcescens: NOT DETECTED
Streptococcus agalactiae: NOT DETECTED
Streptococcus species: NOT DETECTED
VANCOMYCIN RESISTANCE: NOT DETECTED

## 2016-05-11 ENCOUNTER — Encounter: Payer: Self-pay | Admitting: Hematology & Oncology

## 2016-05-11 ENCOUNTER — Ambulatory Visit: Payer: Medicare Other | Admitting: Family

## 2016-05-11 ENCOUNTER — Other Ambulatory Visit: Payer: Medicare Other

## 2016-05-11 ENCOUNTER — Ambulatory Visit: Payer: Medicare Other

## 2016-05-11 LAB — CULTURE, BLOOD (ROUTINE X 2)

## 2016-05-12 LAB — ANAEROBIC CULTURE

## 2016-05-12 LAB — TYPE AND SCREEN
ABO/RH(D): A POS
ANTIBODY SCREEN: NEGATIVE
Unit division: 0
Unit division: 0

## 2016-05-13 LAB — CULTURE, BLOOD (ROUTINE X 2): Culture: NO GROWTH

## 2016-05-14 ENCOUNTER — Telehealth: Payer: Self-pay

## 2016-05-14 LAB — BODY FLUID CULTURE

## 2016-05-14 NOTE — Telephone Encounter (Signed)
On 24-May-2016 I received a death certificate from [facility] (original). The death certificate is for burial. The patient is a patient of Doctor Maneem. The death certificate will be taken to [employer] this pm for signature.  On May 24, 2016 I received the death certificate back from Doctor Maneem. I got the death certificate ready and called the funeral home to let them know the death certificate went out in the mail to the [facility] per their request.

## 2016-05-26 NOTE — Progress Notes (Signed)
ANTICOAGULATION CONSULT NOTE - Initial Consult  Pharmacy Consult for Heparin Indication: pulmonary embolus  Allergies  Allergen Reactions  . Uloric [Febuxostat] Other (See Comments)    Pt states "it locked me up, I couldn't move from the waist up" Upper body paralysis-temporary  . Ivp Dye [Iodinated Diagnostic Agents] Other (See Comments)    Patient continued sneezing   . Statins Other (See Comments)    Patient unable to walk  . Meloxicam Rash    Patient Measurements: Height: 5\' 8"  (172.7 cm) Weight: 164 lb (74.39 kg) IBW/kg (Calculated) : 68.4 Heparin Dosing Weight:   Vital Signs: Temp: 97.3 F (36.3 C) (06/13 0101) Temp Source: Oral (06/13 0101) BP: 112/53 mmHg (06/13 0400) Pulse Rate: 87 (06/13 0400)  Labs:  Recent Labs  2016/05/13 0130 2016-05-13 0225  HGB 10.8*  --   HCT 31.8*  --   PLT 401*  --   CREATININE  --  1.93*    Estimated Creatinine Clearance: 28.5 mL/min (by C-G formula based on Cr of 1.93).   Medical History: Past Medical History  Diagnosis Date  . HYPERLIPIDEMIA   . GOUT   . HYPERTENSION   . CAD     a. s/p CABG x 4 1988;  b. s/p redo CABG x 4 1996;  c. 04/2006 Cath/PCI: LM nl, LAD 100p, LCX 100, RCA 100, LIMA->LAD 40 anast, VG->LCX 95p (3.5x24 [facility] BMS), RIMA->RCA ok;  d. 12/2012 MV: smal area of ischemia in basal inferiolateral wall.  . TRANSIENT ISCHEMIC ATTACK   . NEPHROLITHIASIS   . CKD (chronic kidney disease), stage III   . CONTRAST DYE ALLERGY, HX OF   . HOH (hard of hearing)     a. uses hearing aids  . Cancer [facility]) 2013    bladder cancer, skin cancer  . Asthma   . Hypogammaglobulinemia ([place]) 10/21/2015  . CLL (chronic lymphocytic leukemia) ([place]) 10/21/2015  . Skin cancer     face    Medications:  Infusions:  . heparin      Assessment: Patient with SOB and possible PE.  No oral anticoagulants noted on med rec.   Goal of Therapy:  Heparin level 0.3-0.7 units/ml Monitor platelets by anticoagulation protocol: Yes    Plan:  Heparin bolus 3000  units iv x1 Heparin drip at 1200 units/hr Daily CBC Next heparin level at [place], [place] 13-May-2016,5:32 AM

## 2016-05-26 NOTE — H&P (Signed)
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 male w/ past medical history significant for CLL and facial nerve weakness following parotid gland surgery. Patient states that he has been having abdominal pain for several days. He presented on the first with the same complaint. Patient states that the pain has not been any better. And to the point where hurts to move. Patient states he thinks it makes him feel short of breath. Denies any chest pain. No nausea and vomiting. Denies fever, chills, cough, wheeze.  Patient is getting Rituxan/bendamustine for his chemotherapy.  Review of Systems: As per HPI otherwise 10 point review of systems negative.    Past Medical History  Diagnosis Date  . HYPERLIPIDEMIA   . GOUT   . HYPERTENSION   . CAD     a. s/p CABG x 4 1988;  b. s/p redo CABG x 4 1996;  c. 04/2006 Cath/PCI: LM nl, LAD 100p, LCX 100, RCA 100, LIMA->LAD 40 anast, VG->LCX 95p (3.5x24 [employer] BMS), RIMA->RCA ok;  d. 12/2012 MV: smal area of ischemia in basal inferiolateral wall.  . TRANSIENT ISCHEMIC ATTACK   . NEPHROLITHIASIS   . CKD (chronic kidney disease), stage III   . CONTRAST DYE ALLERGY, HX OF   . HOH (hard of hearing)     a. uses hearing aids  . Cancer [facility]) 2013    bladder cancer, skin cancer  . Asthma   . Hypogammaglobulinemia ([place]) 10/21/2015  . CLL (chronic lymphocytic leukemia) ([place]) 10/21/2015  . Skin cancer     face   Past Surgical History  Procedure Laterality Date  . Coronary artery bypass graft  1986  . Carotid endarterectomy    . Coronary artery bypass graft  1996  . Total knee arthroplasty  10/2009    rt  . Mass excision  05/27/2012    Procedure: EXCISION MASS;  Surgeon: Theodoro Kos, DO;  Location: [place];   Service: Plastics;  Laterality: N/A;  repair of upper lip defect pt is post op  excision of skin cancer with tissue rearrangement or lip switch.  . Left heart catheterization with coronary/graft angiogram N/A 09/01/2013    Procedure: LEFT HEART CATHETERIZATION WITH Beatrix Fetters;  Surgeon: Peter M Martinique, MD;  Location: [facility] CATH LAB;  Service: Cardiovascular;  Laterality: N/A;   Social History:  reports that he quit smoking about 50 years ago. His smoking use included Cigarettes. He started smoking about 63 years ago. He has a 13 pack-year smoking history. He has never used smokeless tobacco. He reports that he does not drink alcohol or use illicit drugs.  Allergies  Allergen Reactions  . Uloric [Febuxostat] Other (See Comments)    Pt states "it locked me up, I couldn't move from the waist up" Upper body paralysis-temporary  . Ivp Dye [Iodinated Diagnostic Agents] Other (See Comments)    Patient continued sneezing   . Statins Other (See Comments)    Patient unable to walk  . Meloxicam Rash    Family History  Problem Relation Age of Onset  . Heart attack Father   . Heart disease Sister   . Stroke Brother      Prior to Admission medications   Medication Sig Start Date End Date Taking? Authorizing Provider  allopurinol (ZYLOPRIM) 100 MG  tablet Take 100 mg by mouth daily as needed (for flareup).    Yes Historical Provider, MD  amLODipine (NORVASC) 5 MG tablet Take 1 tablet (5 mg total) by mouth daily. 07/27/15  Yes Lelon Perla, MD  aspirin EC 81 MG tablet Take 1 tablet (81 mg total) by mouth daily. 03/21/16  Yes Lelon Perla, MD  calcium carbonate (TUMS - DOSED IN MG ELEMENTAL CALCIUM) 500 MG chewable tablet Chew 1 tablet by mouth at bedtime.    Yes Historical Provider, MD  diphenhydrAMINE (BENADRYL) 25 MG tablet Take 50 mg by mouth at bedtime.    Yes Historical Provider, MD  enalapril (VASOTEC) 10 MG tablet Take 1 tablet (10 mg total) by mouth daily. 07/27/15  Yes Lelon Perla, MD  famciclovir (FAMVIR) 250 MG tablet Take 1 tablet (250 mg total) by mouth daily. 04/30/16  Yes [name], MD  hydrochlorothiazide (HYDRODIURIL) 25 MG tablet Take 25 mg by mouth daily.   Yes Historical Provider, MD  HYDROcodone-acetaminophen (NORCO/VICODIN) 5-325 MG tablet Take 2 tablets by mouth every 4 (four) hours as needed. 04/26/16  Yes Samantha Tripp Dowless, PA-C  ibuprofen (ADVIL,MOTRIN) 200 MG tablet Take 200-400 mg by mouth every 6 (six) hours as needed for fever, headache, mild pain, moderate pain or cramping.   Yes Historical Provider, MD  metoprolol (LOPRESSOR) 50 MG tablet Take 0.5 tablets (25 mg total) by mouth 2 (two) times daily. 07/27/15  Yes Lelon Perla, MD  mupirocin ointment (BACTROBAN) 2 % Place 1 application into the nose 2 (two) times daily as needed (for dermatitis).   Yes Historical Provider, MD  nitroGLYCERIN (NITROSTAT) 0.4 MG SL tablet Place 1 tablet (0.4 mg total) under the tongue every 5 (five) minutes as needed for chest pain. 07/27/15  Yes Lelon Perla, MD  Omega-3 Fatty Acids (FISH OIL) 1000 MG CAPS Take 1 capsule by mouth daily.   Yes Historical Provider, MD  ondansetron (ZOFRAN) 8 MG tablet Take 1 tablet (8 mg total) by mouth 2 (two) times daily as needed for refractory nausea / vomiting. Start on day 2 after chemo. 05/02/16  Yes [name], MD  pantoprazole (PROTONIX) 20 MG tablet Take 1 tablet (20 mg total) by mouth daily. 04/26/16  Yes Samantha Tripp Dowless, PA-C  predniSONE (DELTASONE) 5 MG tablet Take 5 mg by mouth daily as needed (for gout flare ups).   Yes Historical Provider, MD  Tamsulosin HCl (FLOMAX) 0.4 MG CAPS Take 0.4 mg by mouth daily as needed (for urination). Reported on 03/21/2016   Yes Historical Provider, MD  Tetrahydrozoline HCl (EYE DROPS OP) Place 1 drop into the left eye at bedtime. Unknown name of eye drop   Yes Historical Provider, MD  dexamethasone (DECADRON) 2 MG tablet Take 1 tablet (2 mg total) by mouth 2 (two)  times daily with a meal. Patient not taking: Reported on 2016-05-15 04/26/16   Samantha Tripp Dowless, PA-C  dexamethasone (DECADRON) 4 MG tablet Take 2 tablets (8 mg total) by mouth daily. Start the day after bendamustine chemotherapy for 2 days. Take with food. Patient not taking: Reported on May 15, 2016 05/02/16   [name], MD   Physical Exam: Filed Vitals:   05-15-2016 0101 15-May-2016 0107 05-15-2016 0200 May 15, 2016 0400  BP: 134/66  131/52 112/53  Pulse: 82  82 87  Temp: 97.3 F (36.3 C)     TempSrc: Oral     Resp: 18  13 18   Height: 5\' 8"  (1.727 m)  Weight: 74.39 kg (164 lb)     SpO2: 94% 97% 98% 95%    Wt Readings from Last 3 Encounters:  16-May-2016 74.39 kg (164 lb)  04/30/16 75.751 kg (167 lb)  03/21/16 69.854 kg (154 lb)    General:  Appears calm and uncomfortable Eyes:  PERRL, EOMI, normal lids, iris ENT:  grossly normal hearing, lips & tongue Neck:no LAD, masses or thyromegaly Cardiovascular:  RRR, no m/r/g. No LE edema.  Respiratory:  Wheezes diffusely, on [place], no incr WOB. No rales. Abdomen:  soft, TTP with light touch, distended Skin:  no rash or induration seen on limited exam Musculoskeletal:  grossly normal tone BUE/BLE Psychiatric:  grossly normal mood and affect, speech fluent and appropriate Neurologic:  CN 2-12 grossly intact except partial Left facial nerve policy, moves all extremities in coordinated fashion.          Labs on Admission:  Basic Metabolic Panel:  Recent Labs Lab 2016-05-16 0225  NA 128*  K 5.3*  CL 101  CO2 12*  GLUCOSE 221*  BUN 76*  CREATININE 1.93*  CALCIUM 10.1   Liver Function Tests:  Recent Labs Lab 16-May-2016 0225  AST 25  ALT 29  ALKPHOS 100  BILITOT 0.5  PROT 5.3*  ALBUMIN 2.8*    Recent Labs Lab 05/16/16 0225  LIPASE 63*   No results for input(s): AMMONIA in the last 168 hours. CBC:  Recent Labs Lab 16-May-2016 0130  WBC 3.7*  HGB 10.8*  HCT 31.8*  MCV 82.4  PLT 401*   Cardiac Enzymes: No results  for input(s): CKTOTAL, CKMB, CKMBINDEX, TROPONINI in the last 168 hours.  BNP (last 3 results)  Recent Labs  10/03/15 1441 02/24/16 1942 02/28/16 1737  BNP 351.8* 278.9* 238.2*    ProBNP (last 3 results) No results for input(s): PROBNP in the last 8760 hours.   CREATININE: 1.93 mg/dL ABNORMAL (05/16/2016 0225) Estimated creatinine clearance - 28.5 mL/min  CBG: No results for input(s): GLUCAP in the last 168 hours.  Radiological Exams on Admission: Dg Chest 2 View  May 16, 2016  CLINICAL DATA:  Shortness of breath and upper abdominal pain. EXAM: CHEST  2 VIEW COMPARISON:  02/28/2016 FINDINGS: Postoperative changes in the mediastinum. Normal heart size and pulmonary vascularity. No focal airspace disease or consolidation in the lungs. No blunting of costophrenic angles. No pneumothorax. Mediastinal contours appear intact. Degenerative changes in the thoracic spine. Old left rib fractures. Calcification of the aorta. Degenerative changes in the right shoulder. IMPRESSION: No active cardiopulmonary disease. Electronically Signed   By: Lucienne Capers M.D.   On: 05-16-16 02:32   Dg Abd 1 View  May 16, 2016  CLINICAL DATA:  Acute onset of lower mid abdominal pain. No bowel movements for 1 week. Initial encounter. EXAM: ABDOMEN - 1 VIEW COMPARISON:  CT of the abdomen and pelvis from 04/26/2016 FINDINGS: The visualized bowel gas pattern is unremarkable. Scattered air and stool filled loops of colon are seen; no abnormal dilatation of small bowel loops is seen to suggest small bowel obstruction. No free intra-abdominal air is identified, though evaluation for free air is limited on a single supine view. Mild degenerative change is noted along the lumbar spine; the sacroiliac joints are unremarkable in appearance. Chronic sclerotic change is noted at the pubic symphysis and sacroiliac joints. Clips are noted overlying the right upper quadrant. Residual contrast is seen along sigmoid diverticula.  IMPRESSION: Unremarkable bowel gas pattern; no free intra-abdominal air seen. Small to moderate amount of stool noted in the  colon. Electronically Signed   By: Garald Balding M.D.   On: 05-31-2016 03:47    EKG: Independently reviewed. Sinus rhythm  Assessment/Plan Principal Problem:   Acute respiratory failure with hypoxia (HCC) Active Problems:   Chronic kidney disease (CKD), stage III (moderate)   GERD (gastroesophageal reflux disease)   Diastolic heart failure (HCC)   HLD (hyperlipidemia)   Constipation     Acute respiratory failure with hypoxia Patient on 4 L nasal cannula with saturations so dropping into the low 90s DuoNeb scheduled When necessary albuterol Patient started on heparin in the emergency room VQ scan planned  Constipation, severe This is been a chronic problem that is now worse causing patient abdominal pain Once lactic acid is normal would place patient on 3 times a day MiraLAX 17 g for 2-3 days with a clear liquid diet  CKD Baseline creatinine around 1.8 We'll monitor urine output and creatinine Renal dosing of medications, pharmacy will assist  Metabolic acidosis Lactic acid 4.38, serial testing Hydrochlorothiazide wall hydrating patient, will restart later  CLL receiving chemotherapy Continue Famvir  Hyperlipidemia Continue fish oil  Reflux pantoprazole 20 mg daily  Facial nerve palsy Continue tetrahydrolozine drops in left eye  Hypertension  continue Norvasc 5 mg by mouth daily, Vasotec 10 mg by mouth daily  Congestive heart failure Continue Lopressor 25 mg twice a day  Coronary artery disease When necessary nitroglycerin sublingual  BPH Flomax daily when necessary  Gout No sign of current flare Holding when necessary allopurinol  Code Status: Full code  DVT Prophylaxis: Heparin Family Communication: Family at bedside Disposition Plan: Pending Improvement    Elwin Mocha, MD [facility] [email or web address] Password [email or web address]

## 2016-05-26 NOTE — Anesthesia Preprocedure Evaluation (Signed)
Anesthesia Evaluation  Patient identified by MRN, date of birth, ID band Patient awake    Reviewed: Allergy & Precautions, NPO status , Patient's Chart, lab work & pertinent test results  Airway Mallampati: I  TM Distance: >3 FB Neck ROM: Full    Dental   Pulmonary former smoker,    Pulmonary exam normal        Cardiovascular hypertension, Pt. on medications + CAD, + Past MI and + CABG  Normal cardiovascular exam     Neuro/Psych    GI/Hepatic GERD  Medicated and Controlled,  Endo/Other    Renal/GU Renal InsufficiencyRenal disease     Musculoskeletal   Abdominal   Peds  Hematology   Anesthesia Other Findings   Reproductive/Obstetrics                             Anesthesia Physical Anesthesia Plan  ASA: IV and emergent  Anesthesia Plan: General   Post-op Pain Management:    Induction: Intravenous, Rapid sequence and Cricoid pressure planned  Airway Management Planned: Oral ETT  Additional Equipment: Arterial line, CVP and Ultrasound Guidance Line Placement  Intra-op Plan:   Post-operative Plan: Possible Post-op intubation/ventilation  Informed Consent: I have reviewed the patients History and Physical, chart, labs and discussed the procedure including the risks, benefits and alternatives for the proposed anesthesia with the patient or authorized representative who has indicated his/her understanding and acceptance.     Plan Discussed with: CRNA and Surgeon  Anesthesia Plan Comments:         Anesthesia Quick Evaluation

## 2016-05-26 NOTE — Progress Notes (Signed)
Pt extubated per MD order/family wishes.  RT to monitor and assess as needed.

## 2016-05-26 NOTE — Discharge Summary (Signed)
Death Summary  [name] [name] [id number] DOB: Jul 18, 1931 DOA: 05-26-16  PCP: Olin Hauser, MD  Admit date: 05-26-2016 Date of Death: 01-Jun-2016 Time of Death: 03-14-05 Notification: Olin Hauser, MD notified of death of 06/01/2016   History of present illness:  [name] [name] is a 82 y.o. male with a history of CKD stage III, CLL, on chemo, CAD who presents with worsening abdominal pain, lactic acidosis, sepsis. Patient was admitted to the hospital. IV fluids and IV antibiotics ordered. Subsequently CT abdomen showed pneumatosis intestinal. Surgery was consulted. Patient was taken to OR for surgery for concern of ischemic bowel. Patient was found to have infarction of approximately one half of the small bowel with newly diagnosed abdominal carcinomatosis. Dr Lucia Gaskins discussed with family about finding. Patient was made comfort care. He was subsequently extubated and pass at 22;06.   Final Diagnoses:  Infarction of approximately one-half of the small bowel with newly diagnosed abdominal carcinomatosis. Sepsis. Lactic acidosis.  CLL AKI on chronic  Chronic Diastolic Congestive heart failure  The results of significant diagnostics from this hospitalization (including imaging, microbiology, ancillary and laboratory) are listed below for reference.    Significant Diagnostic Studies: Ct Abdomen Pelvis Wo Contrast  May 26, 2016  CLINICAL DATA:  Abdominal pain. History bladder cancer. CLL on chemotherapy. Contrast allergy. EXAM: CT ABDOMEN AND PELVIS WITHOUT CONTRAST TECHNIQUE: Multidetector CT imaging of the abdomen and pelvis was performed following the standard protocol without IV contrast. COMPARISON:  CT abdomen pelvis 04/26/2016 FINDINGS: Lower chest: Mild bronchial wall thickening in the lung bases left greater than right. This shows mild progression from the prior study and could reflect bronchitis. No lobar pneumonia or effusion. Hepatobiliary: Portal venous gas has developed since  the prior study. No focal liver mass lesion. Liver size normal. Gallbladder and bile ducts nondilated. Negative for pneumoperitoneum. Pancreas: Negative Spleen: Small spleen without focal lesion Adrenals/Urinary Tract: No renal mass or obstruction. Nonobstructing bilateral renal calculi. Layering calcifications in the bladder compatible with bladder calculi. These were not present on the prior CT. Stomach/Bowel: Negative for bowel obstruction. Mild pneumatosis in the stomach. Curvilinear extraluminal gas is seen around the stomach most compatible with venous gas. No gastric thickening or mass lesion. Pneumatosis also present in small bowel and colon. Negative for bowel obstruction. Sigmoid diverticulosis without diverticulitis. Moderate hiatal hernia Vascular/Lymphatic: Atherosclerotic calcification in the aorta and iliac arteries. No aneurysm. Improvement in mesenteric adenopathy since the prior study. Lymph nodes are now all under 1 cm. Perirectal lymph node also significantly improved following chemotherapy. Reproductive: Prostate enlargement with prostate calcification Other: Mild amount of free fluid around the liver. Small amount of free fluid between bowel loops in the abdomen. Negative for pneumoperitoneum. Musculoskeletal: Grade 1 slip L5-S1 with bilateral pars defects of L5. No fracture or focal bony lesion. IMPRESSION: Portal venous gas in the liver. There is also venous gas around the stomach. No gastric edema. There is pneumatosis involving the colon and small bowel. Small amount of free fluid around the liver. Differential diagnosis includes bowel ischemia. Benign pneumatosis also in the differential. Currently the patient has abdominal pain. Surgical consultation recommended. Improvement in mesenteric and abdominal adenopathy since 04/26/2016 related to chemotherapy. These results were called by telephone at the time of interpretation on 05/26/16 at 10:04 am to Dr. Niel Hummer , who verbally  acknowledged these results. Electronically Signed   By: Franchot Gallo M.D.   On: May 26, 2016 10:05   Ct Abdomen Pelvis Wo Contrast  04/26/2016  CLINICAL DATA:  Abdominal discomfort,  distention and firmness. Chronic constipation. History of CLL. EXAM: CT ABDOMEN AND PELVIS WITHOUT CONTRAST TECHNIQUE: Multidetector CT imaging of the abdomen and pelvis was performed following the standard protocol without IV contrast. COMPARISON:  01/02/2016 FINDINGS: Lower chest: Bibasilar scarring versus atelectasis. No definite lower lobe pneumonia. No pericardial or pleural effusion. Normal heart size. Small hiatal hernia noted. Anterior pericardial adenopathy noted, measuring 12 mm in short axis, image 7. These are larger than the prior study. Hepatobiliary: No definite focal hepatic abnormality by noncontrast imaging. No biliary dilatation within the liver. Gallbladder and biliary system remain unremarkable. Small amount of new perihepatic ascites. Pancreas: No mass or inflammatory process identified on this un-enhanced exam. Spleen: Spleen is normal in size. Small amount of perisplenic ascites. Adrenals/Urinary Tract: Normal adrenal glands by noncontrast imaging. Kidneys demonstrate small similar parapelvic cysts. Punctate nonobstructing left lower pole renal calculus, image 35 measures 3 mm. No renal obstruction or hydronephrosis. No ureteral obstruction or calculus. Chronic perinephric strandy edema. No new finding. Stomach/Bowel: Negative for bowel obstruction, significant dilatation, ileus, or free air. Diverticulosis noted of the colon, most pronounced in the sigmoid within the pelvis. Vascular/Lymphatic: Extensive calcific atherosclerosis of the aorta without aneurysm. No retroperitoneal hemorrhage or acute hematoma. Innumerable central mesenteric lymph nodes. These have increased in both size and number. Index right mesenteric lymph node measures 24 mm, previously 20 mm. Numerous enlarging para-aortic and pericaval  lymph nodes also noted. Diffuse mesenteric vascular congestion/ edema evident suspect secondary to the adenopathy. This has progressed. Small amount of abdominal ascites along the pericolic gutters extends into the pelvis. Enlarged diffuse iliac chain lymph nodes present. Within the pelvis, there is a large perirectal lymph node measuring 16 mm, previously 14 mm. External iliac and inguinal lymph nodes are also larger. Index right inguinal lymph node now measures 14 mm in short axis, previously 12 mm. These findings suggest progression of CLL throughout the abdomen and pelvis. Reproductive: Prostate gland is enlarged with calcification. Seminal vesicles are atrophic. Other: No other inguinal abnormality or hernia. Intact abdominal wall. Musculoskeletal: Degenerative osteophytes of the lower thoracic and lumbar spine. Chronic bilateral L5 pars defects. IMPRESSION: Progression of diffuse central mesenteric, retroperitoneal, iliac and inguinal adenopathy with mesenteric edema/ congestion. Interval development of small amount of abdominal ascites. Appearance is compatible with worsening CLL. No associated obstruction or free air. Other chronic findings are stable as above. Electronically Signed   By: Jerilynn Mages.  Shick M.D.   On: 04/26/2016 17:24   Dg Chest 2 View  May 19, 2016  CLINICAL DATA:  Shortness of breath and upper abdominal pain. EXAM: CHEST  2 VIEW COMPARISON:  02/28/2016 FINDINGS: Postoperative changes in the mediastinum. Normal heart size and pulmonary vascularity. No focal airspace disease or consolidation in the lungs. No blunting of costophrenic angles. No pneumothorax. Mediastinal contours appear intact. Degenerative changes in the thoracic spine. Old left rib fractures. Calcification of the aorta. Degenerative changes in the right shoulder. IMPRESSION: No active cardiopulmonary disease. Electronically Signed   By: Lucienne Capers M.D.   On: 2016-05-19 02:32   Dg Abd 1 View  2016-05-19  CLINICAL DATA:   Acute onset of lower mid abdominal pain. No bowel movements for 1 week. Initial encounter. EXAM: ABDOMEN - 1 VIEW COMPARISON:  CT of the abdomen and pelvis from 04/26/2016 FINDINGS: The visualized bowel gas pattern is unremarkable. Scattered air and stool filled loops of colon are seen; no abnormal dilatation of small bowel loops is seen to suggest small bowel obstruction. No free intra-abdominal air is identified,  though evaluation for free air is limited on a single supine view. Mild degenerative change is noted along the lumbar spine; the sacroiliac joints are unremarkable in appearance. Chronic sclerotic change is noted at the pubic symphysis and sacroiliac joints. Clips are noted overlying the right upper quadrant. Residual contrast is seen along sigmoid diverticula. IMPRESSION: Unremarkable bowel gas pattern; no free intra-abdominal air seen. Small to moderate amount of stool noted in the colon. Electronically Signed   By: Garald Balding M.D.   On: May 28, 2016 03:47    Microbiology: Recent Results (from the past 240 hour(s))  MRSA PCR Screening     Status: None   Collection Time: 2016-05-28  9:29 AM  Result Value Ref Range Status   MRSA by PCR NEGATIVE NEGATIVE Final    Comment:        The GeneXpert MRSA Assay (FDA approved for NASAL specimens only), is one component of a comprehensive MRSA colonization surveillance program. It is not intended to diagnose MRSA infection nor to guide or monitor treatment for MRSA infections.   Culture, blood (x 2)     Status: None   Collection Time: May 28, 2016  9:57 AM  Result Value Ref Range Status   Specimen Description BLOOD RIGHT ARM  Final   Special Requests IN PEDIATRIC BOTTLE 1CC  Final   Culture   Final    NO GROWTH 5 DAYS Performed at [facility]    Report Status 05/13/2016 FINAL  Final  Culture, blood (x 2)     Status: Abnormal   Collection Time: 2016/05/28 10:04 AM  Result Value Ref Range Status   Specimen Description BLOOD RIGHT ARM   Final   Special Requests IN PEDIATRIC BOTTLE 4CC  Final   Culture  Setup Time   Final    GRAM NEGATIVE RODS AEROBIC BOTTLE ONLY Organism ID to follow    Culture KLEBSIELLA PNEUMONIAE (A)  Final   Report Status 05/11/2016 FINAL  Final   Organism ID, Bacteria KLEBSIELLA PNEUMONIAE  Final      Susceptibility   Klebsiella pneumoniae - MIC*    AMPICILLIN >=32 RESISTANT Resistant     CEFAZOLIN <=4 SENSITIVE Sensitive     CEFEPIME <=1 SENSITIVE Sensitive     CEFTAZIDIME <=1 SENSITIVE Sensitive     CEFTRIAXONE <=1 SENSITIVE Sensitive     CIPROFLOXACIN <=0.25 SENSITIVE Sensitive     GENTAMICIN <=1 SENSITIVE Sensitive     IMIPENEM <=0.25 SENSITIVE Sensitive     TRIMETH/SULFA <=20 SENSITIVE Sensitive     AMPICILLIN/SULBACTAM 4 SENSITIVE Sensitive     PIP/TAZO <=4 SENSITIVE Sensitive     * KLEBSIELLA PNEUMONIAE  Blood Culture ID Panel (Reflexed)     Status: Abnormal   Collection Time: 28-May-2016 10:04 AM  Result Value Ref Range Status   Enterococcus species NOT DETECTED NOT DETECTED Final   Vancomycin resistance NOT DETECTED NOT DETECTED Final   Listeria monocytogenes NOT DETECTED NOT DETECTED Final   Staphylococcus species NOT DETECTED NOT DETECTED Final   Staphylococcus aureus NOT DETECTED NOT DETECTED Final   Methicillin resistance NOT DETECTED NOT DETECTED Final   Streptococcus species NOT DETECTED NOT DETECTED Final   Streptococcus agalactiae NOT DETECTED NOT DETECTED Final   Streptococcus pneumoniae NOT DETECTED NOT DETECTED Final   Streptococcus pyogenes NOT DETECTED NOT DETECTED Final   Acinetobacter baumannii NOT DETECTED NOT DETECTED Final   Enterobacteriaceae species DETECTED (A) NOT DETECTED Final    Comment: PATIENT DISCHARGED OR EXPIRED JULIAN GRIMSLEY,PHARMD @0504  05/09/16 MKELLY    Enterobacter  cloacae complex NOT DETECTED NOT DETECTED Final   Escherichia coli NOT DETECTED NOT DETECTED Final   Klebsiella oxytoca NOT DETECTED NOT DETECTED Final   Klebsiella pneumoniae  DETECTED (A) NOT DETECTED Final    Comment: PATIENT DISCHARGED OR EXPIRED JULIAN GRIMSLEY,PHARMD @0507  05/09/16 MKELLY    Proteus species NOT DETECTED NOT DETECTED Final   Serratia marcescens NOT DETECTED NOT DETECTED Final   Carbapenem resistance NOT DETECTED NOT DETECTED Final   Haemophilus influenzae NOT DETECTED NOT DETECTED Final   Neisseria meningitidis NOT DETECTED NOT DETECTED Final   Pseudomonas aeruginosa NOT DETECTED NOT DETECTED Final   Candida albicans NOT DETECTED NOT DETECTED Final   Candida glabrata NOT DETECTED NOT DETECTED Final   Candida krusei NOT DETECTED NOT DETECTED Final   Candida parapsilosis NOT DETECTED NOT DETECTED Final   Candida tropicalis NOT DETECTED NOT DETECTED Final    Comment: Performed at [facility]  Body fluid culture     Status: None   Collection Time: 05-31-16  2:34 PM  Result Value Ref Range Status   Specimen Description FLUID PERITONEAL  Final   Special Requests FLUID ON SWAB  Final   Gram Stain   Final    FEW WBC PRESENT,BOTH PMN AND MONONUCLEAR FEW GRAM VARIABLE ROD FEW GRAM POSITIVE COCCI IN PAIRS Performed at [facility]    Culture   Final    FEW KLEBSIELLA PNEUMONIAE FEW ENTEROCOCCUS SPECIES    Report Status 05/14/2016 FINAL  Final   Organism ID, Bacteria KLEBSIELLA PNEUMONIAE  Final   Organism ID, Bacteria ENTEROCOCCUS SPECIES  Final      Susceptibility   Klebsiella pneumoniae - MIC*    AMPICILLIN >=32 RESISTANT Resistant     CEFAZOLIN <=4 SENSITIVE Sensitive     CEFEPIME <=1 SENSITIVE Sensitive     CEFTAZIDIME <=1 SENSITIVE Sensitive     CEFTRIAXONE <=1 SENSITIVE Sensitive     CIPROFLOXACIN <=0.25 SENSITIVE Sensitive     GENTAMICIN <=1 SENSITIVE Sensitive     IMIPENEM <=0.25 SENSITIVE Sensitive     TRIMETH/SULFA <=20 SENSITIVE Sensitive     AMPICILLIN/SULBACTAM 4 SENSITIVE Sensitive     PIP/TAZO <=4 SENSITIVE Sensitive     * FEW KLEBSIELLA PNEUMONIAE   Enterococcus species - MIC*    AMPICILLIN Value in  next row Sensitive      SENSITIVE<=2    VANCOMYCIN Value in next row Sensitive      SENSITIVE2    GENTAMICIN SYNERGY Value in next row Sensitive      SENSITIVE2    LINEZOLID Value in next row Sensitive      SENSITIVE2    * FEW ENTEROCOCCUS SPECIES  Anaerobic culture     Status: None   Collection Time: May 31, 2016  2:34 PM  Result Value Ref Range Status   Specimen Description FLUID PERITONEAL  Final   Special Requests FLUID ON SWAB Performed at [facility]   Final   Culture [place]  Final   Report Status 05/12/2016 FINAL  Final     Labs: Basic Metabolic Panel:  Recent Labs Lab 05-31-2016 0225 May 31, 2016 1321 2016/05/31 1442 05/31/2016 1520  NA 128* 129* 134* 137  K 5.3* 5.4* 4.9 4.6  CL 101  --   --   --   CO2 12*  --   --   --   GLUCOSE 221*  --   --   --   BUN 76*  --   --   --   CREATININE 1.93*  --   --   --  CALCIUM 10.1  --   --   --    Liver Function Tests:  Recent Labs Lab May 11, 2016 0225  AST 25  ALT 29  ALKPHOS 100  BILITOT 0.5  PROT 5.3*  ALBUMIN 2.8*    Recent Labs Lab 05/11/16 0225  LIPASE 63*   No results for input(s): AMMONIA in the last 168 hours. CBC:  Recent Labs Lab 2016/05/11 0130 May 11, 2016 1321 05/11/16 1442 11-May-2016 1520  WBC 3.7*  --   --   --   HGB 10.8* 8.5* 7.5* 5.4*  HCT 31.8* 25.0* 22.0* 16.0*  MCV 82.4  --   --   --   PLT 401*  --   --   --    Cardiac Enzymes: No results for input(s): CKTOTAL, CKMB, CKMBINDEX, TROPONINI in the last 168 hours. D-Dimer No results for input(s): DDIMER in the last 72 hours. BNP: Invalid input(s): POCBNP CBG:  Recent Labs Lab 05-11-16 1038  GLUCAP 160*   Anemia work up No results for input(s): VITAMINB12, FOLATE, FERRITIN, TIBC, IRON, RETICCTPCT in the last 72 hours. Urinalysis    Component Value Date/Time   COLORURINE YELLOW 11-May-2016 0342   APPEARANCEUR CLOUDY* 2016/05/11 0342   LABSPEC 1.018 05-11-16 0342   PHURINE 5.0 05-11-16 0342   GLUCOSEU 100*  05/11/2016 0342   HGBUR TRACE* 2016-05-11 0342   BILIRUBINUR NEGATIVE 05/11/16 0342   KETONESUR NEGATIVE May 11, 2016 0342   PROTEINUR NEGATIVE 05-11-16 0342   UROBILINOGEN 1.0 10/04/2015 0226   NITRITE NEGATIVE May 11, 2016 0342   LEUKOCYTESUR NEGATIVE 05/11/2016 0342   Sepsis Labs Invalid input(s): PROCALCITONIN,  WBC,  LACTICIDVEN     SIGNED:  Elmarie Shiley, MD  [facility] 05/14/2016, 11:06 AM Pager [phone]  If 7PM-7AM, please contact night-coverage [email or web address] Password [email or web address]

## 2016-05-26 NOTE — Progress Notes (Signed)
Pharmacy Antibiotic Follow-up Note  [name] [name] is a 82 y.o. year-old male admitted on May 30, 2016.  The patient is currently on day 1 of Zosyn 3.375gm q8 for r/o sepsis.  Assessment/Plan: Zosyn 3.375g IV q8h (4 hour infusion).  Temp (24hrs), Avg:97.3 F (36.3 C), Min:97.3 F (36.3 C), Max:97.3 F (36.3 C)   Recent Labs Lab 05-30-2016 0130  WBC 3.7*    Recent Labs Lab 05-30-2016 0225  CREATININE 1.93*   Estimated Creatinine Clearance: 28.5 mL/min (by C-G formula based on Cr of 1.93).    Allergies  Allergen Reactions  . Uloric [Febuxostat] Other (See Comments)    Pt states "it locked me up, I couldn't move from the waist up" Upper body paralysis-temporary  . Ivp Dye [Iodinated Diagnostic Agents] Other (See Comments)    Patient continued sneezing   . Statins Other (See Comments)    Patient unable to walk  . Meloxicam Rash   Antimicrobials this admission: 6/13 Zosyn >>   Levels/dose changes this admission:  Microbiology results: 6/13 BCx: ordered   Thank you for allowing pharmacy to be a part of this patient's care.  Minda Ditto PharmD 2016/05/30 8:41 AM

## 2016-05-26 NOTE — Consult Note (Signed)
Reason for Consult:  Portal vein gas, constipation, very severe abdominal pain and SOB Referring Physician: B  Regalado  KHALIF STENDER is an 82 y.o. male with CLL, CAD, stage III kidney disease.  Seen 04/26/16 and told constipation is secondary to lymphadenopathy.  HPI: Pt presents to the ED early this AM with above symptoms with sudden onset. He had chemothearpy on 6/8 & 05/04/16. His wife says he has not done well since the chemotherapy with ongoing abdominal pain and constipation.  She has been treating him with laxitives. He started having episodes of nausea and vomiting on 05/07/16 with new SOB.  He was transported to [facility] by EMS. Work up shows he is afebrile, TAchycardic, BP is coming down, Acidotic, Na 126, K+ 5.3 Creatinine is 5.3.  Lactate rising; CT scan just completed: Portal venous gas in the liver. There is also venous gas around the stomach. No gastric edema. There is pneumatosis involving the colon and small bowel. Small amount of free fluid around the liver. Differential diagnosis includes bowel ischemia. Benign pneumatosis also in the differential. Currently the patient has abdominal pain. Surgical consultation recommended.  Improvement in mesenteric and abdominal adenopathy since 04/26/2016 related to chemotherapy.   Past Medical History  Diagnosis Date  . HYPERLIPIDEMIA   . GOUT   . HYPERTENSION   . CAD     a. s/p CABG x 4 1988;  b. s/p redo CABG x 4 1996;  c. 04/2006 Cath/PCI: LM nl, LAD 100p, LCX 100, RCA 100, LIMA->LAD 40 anast, VG->LCX 95p (3.5x24 [employer] BMS), RIMA->RCA ok;  d. 12/2012 MV: smal area of ischemia in basal inferiolateral wall.  . TRANSIENT ISCHEMIC ATTACK   . NEPHROLITHIASIS   . CKD (chronic kidney disease), stage III   . CONTRAST DYE ALLERGY, HX OF   . HOH (hard of hearing)     a. uses hearing aids  . Cancer [facility]) 2013    bladder cancer, skin cancer  . Asthma   . Hypogammaglobulinemia ([place]) 10/21/2015  . CLL (chronic lymphocytic leukemia) ([place]) 10/21/2015   . Skin cancer     face    Past Surgical History  Procedure Laterality Date  . Coronary artery bypass graft  1986  . Carotid endarterectomy    . Coronary artery bypass graft  1996  . Total knee arthroplasty  10/2009    rt  . Mass excision  05/27/2012    Procedure: EXCISION MASS;  Surgeon: Theodoro Kos, DO;  Location: [place];  Service: Plastics;  Laterality: N/A;  repair of upper lip defect pt is post op  excision of skin cancer with tissue rearrangement or lip switch.  . Left heart catheterization with coronary/graft angiogram N/A 09/01/2013    Procedure: LEFT HEART CATHETERIZATION WITH Beatrix Fetters;  Surgeon: Peter M Martinique, MD;  Location: [facility] CATH LAB;  Service: Cardiovascular;  Laterality: N/A;    Family History  Problem Relation Age of Onset  . Heart attack Father   . Heart disease Sister   . Stroke Brother     Social History:  reports that he quit smoking about 50 years ago. His smoking use included Cigarettes. He started smoking about 63 years ago. He has a 13 pack-year smoking history. He has never used smokeless tobacco. He reports that he does not drink alcohol or use illicit drugs.  Allergies:  Allergies  Allergen Reactions  . Uloric [Febuxostat] Other (See Comments)    Pt states "it locked me up, I couldn't move from the waist  up" Upper body paralysis-temporary  . Ivp Dye [Iodinated Diagnostic Agents] Other (See Comments)    Patient continued sneezing   . Statins Other (See Comments)    Patient unable to walk  . Meloxicam Rash    Prior to Admission medications   Medication Sig Start Date End Date Taking? Authorizing Provider  allopurinol (ZYLOPRIM) 100 MG tablet Take 100 mg by mouth daily as needed (for flareup).    Yes Historical Provider, MD  amLODipine (NORVASC) 5 MG tablet Take 1 tablet (5 mg total) by mouth daily. 07/27/15  Yes Lelon Perla, MD  aspirin EC 81 MG tablet Take 1 tablet (81 mg total) by mouth daily. 03/21/16  Yes  Lelon Perla, MD  calcium carbonate (TUMS - DOSED IN MG ELEMENTAL CALCIUM) 500 MG chewable tablet Chew 1 tablet by mouth at bedtime.    Yes Historical Provider, MD  diphenhydrAMINE (BENADRYL) 25 MG tablet Take 50 mg by mouth at bedtime.    Yes Historical Provider, MD  enalapril (VASOTEC) 10 MG tablet Take 1 tablet (10 mg total) by mouth daily. 07/27/15  Yes Lelon Perla, MD  famciclovir (FAMVIR) 250 MG tablet Take 1 tablet (250 mg total) by mouth daily. 04/30/16  Yes [name], MD  hydrochlorothiazide (HYDRODIURIL) 25 MG tablet Take 25 mg by mouth daily.   Yes Historical Provider, MD  HYDROcodone-acetaminophen (NORCO/VICODIN) 5-325 MG tablet Take 2 tablets by mouth every 4 (four) hours as needed. 04/26/16  Yes Samantha Tripp Dowless, PA-C  ibuprofen (ADVIL,MOTRIN) 200 MG tablet Take 200-400 mg by mouth every 6 (six) hours as needed for fever, headache, mild pain, moderate pain or cramping.   Yes Historical Provider, MD  metoprolol (LOPRESSOR) 50 MG tablet Take 0.5 tablets (25 mg total) by mouth 2 (two) times daily. 07/27/15  Yes Lelon Perla, MD  mupirocin ointment (BACTROBAN) 2 % Place 1 application into the nose 2 (two) times daily as needed (for dermatitis).   Yes Historical Provider, MD  nitroGLYCERIN (NITROSTAT) 0.4 MG SL tablet Place 1 tablet (0.4 mg total) under the tongue every 5 (five) minutes as needed for chest pain. 07/27/15  Yes Lelon Perla, MD  Omega-3 Fatty Acids (FISH OIL) 1000 MG CAPS Take 1 capsule by mouth daily.   Yes Historical Provider, MD  ondansetron (ZOFRAN) 8 MG tablet Take 1 tablet (8 mg total) by mouth 2 (two) times daily as needed for refractory nausea / vomiting. Start on day 2 after chemo. 05/02/16  Yes [name], MD  pantoprazole (PROTONIX) 20 MG tablet Take 1 tablet (20 mg total) by mouth daily. 04/26/16  Yes Samantha Tripp Dowless, PA-C  predniSONE (DELTASONE) 5 MG tablet Take 5 mg by mouth daily as needed (for gout flare ups).   Yes Historical  Provider, MD  Tamsulosin HCl (FLOMAX) 0.4 MG CAPS Take 0.4 mg by mouth daily as needed (for urination). Reported on 03/21/2016   Yes Historical Provider, MD  Tetrahydrozoline HCl (EYE DROPS OP) Place 1 drop into the left eye at bedtime. Unknown name of eye drop   Yes Historical Provider, MD  dexamethasone (DECADRON) 2 MG tablet Take 1 tablet (2 mg total) by mouth 2 (two) times daily with a meal. Patient not taking: Reported on June 05, 2016 04/26/16   Samantha Tripp Dowless, PA-C  dexamethasone (DECADRON) 4 MG tablet Take 2 tablets (8 mg total) by mouth daily. Start the day after bendamustine chemotherapy for 2 days. Take with food. Patient not taking: Reported on Jun 05, 2016 05/02/16  [name], MD    Results for orders placed or performed during the hospital encounter of 29-May-2016 (from the past 48 hour(s))  CBC     Status: Abnormal   Collection Time: 2016-05-29  1:30 AM  Result Value Ref Range   WBC 3.7 (L) 4.0 - 10.5 K/uL   RBC 3.86 (L) 4.22 - 5.81 MIL/uL   Hemoglobin 10.8 (L) 13.0 - 17.0 g/dL   HCT 31.8 (L) 39.0 - 52.0 %   MCV 82.4 78.0 - 100.0 fL   MCH 28.0 26.0 - 34.0 pg   MCHC 34.0 30.0 - 36.0 g/dL   RDW 17.1 (H) 11.5 - 15.5 %   Platelets 401 (H) 150 - 400 K/uL  I-Stat Troponin, ED (not at [facility])     Status: None   Collection Time: 05-29-2016  1:38 AM  Result Value Ref Range   Troponin i, poc 0.06 0.00 - 0.08 ng/mL   Comment 3            Comment: Due to the release kinetics of cTnI, a negative result within the first hours of the onset of symptoms does not rule out myocardial infarction with certainty. If myocardial infarction is still suspected, repeat the test at appropriate intervals.   Comprehensive metabolic panel     Status: Abnormal   Collection Time: 05/29/2016  2:25 AM  Result Value Ref Range   Sodium 128 (L) 135 - 145 mmol/L   Potassium 5.3 (H) 3.5 - 5.1 mmol/L   Chloride 101 101 - 111 mmol/L   CO2 12 (L) 22 - 32 mmol/L   Glucose, Bld 221 (H) 65 - 99 mg/dL   BUN 76 (H)  6 - 20 mg/dL   Creatinine, Ser 1.93 (H) 0.61 - 1.24 mg/dL   Calcium 10.1 8.9 - 10.3 mg/dL   Total Protein 5.3 (L) 6.5 - 8.1 g/dL   Albumin 2.8 (L) 3.5 - 5.0 g/dL   AST 25 15 - 41 U/L   ALT 29 17 - 63 U/L   Alkaline Phosphatase 100 38 - 126 U/L   Total Bilirubin 0.5 0.3 - 1.2 mg/dL   GFR calc non Af Amer 31 (L) >60 mL/min   GFR calc Af Amer 36 (L) >60 mL/min    Comment: (NOTE) The eGFR has been calculated using the CKD EPI equation. This calculation has not been validated in all clinical situations. eGFR's persistently <60 mL/min signify possible Chronic Kidney Disease.    Anion gap 15 5 - 15  Lipase, blood     Status: Abnormal   Collection Time: May 29, 2016  2:25 AM  Result Value Ref Range   Lipase 63 (H) 11 - 51 U/L  Urinalysis, Routine w reflex microscopic     Status: Abnormal   Collection Time: May 29, 2016  3:42 AM  Result Value Ref Range   Color, Urine YELLOW YELLOW   APPearance CLOUDY (A) CLEAR   Specific Gravity, Urine 1.018 1.005 - 1.030   pH 5.0 5.0 - 8.0   Glucose, UA 100 (A) NEGATIVE mg/dL   Hgb urine dipstick TRACE (A) NEGATIVE   Bilirubin Urine NEGATIVE NEGATIVE   Ketones, ur NEGATIVE NEGATIVE mg/dL   Protein, ur NEGATIVE NEGATIVE mg/dL   Nitrite NEGATIVE NEGATIVE   Leukocytes, UA NEGATIVE NEGATIVE  Urine microscopic-add on     Status: None   Collection Time: 05-29-16  3:42 AM  Result Value Ref Range   Squamous Epithelial / LPF NONE SEEN NONE SEEN   WBC, UA NONE SEEN 0 -  5 WBC/hpf   RBC / HPF 0-5 0 - 5 RBC/hpf   Bacteria, UA NONE SEEN NONE SEEN  Blood gas, arterial ([facility] & [facility] ONLY)     Status: Abnormal   Collection Time: 05/15/16  4:48 AM  Result Value Ref Range   O2 Content 2.0 L/min   Delivery systems NASAL CANNULA    pH, Arterial 7.361 7.350 - 7.450   pCO2 arterial 22.4 (L) 35.0 - 45.0 mmHg   pO2, Arterial 72.5 (L) 80.0 - 100.0 mmHg   Bicarbonate 12.5 (L) 20.0 - 24.0 mEq/L   TCO2 11.5 0 - 100 mmol/L   Acid-base deficit 11.3 (H) 0.0 - 2.0 mmol/L   O2  Saturation 93.8 %   Patient temperature 97.3    Collection site RIGHT RADIAL    Drawn by [phone]    Sample type ARTERIAL DRAW    Allens test (pass/fail) PASS PASS  I-Stat CG4 Lactic Acid, ED     Status: Abnormal   Collection Time: 2016/05/15  5:49 AM  Result Value Ref Range   Lactic Acid, Venous 4.38 (HH) 0.5 - 2.0 mmol/L   Comment NOTIFIED PHYSICIAN   Lactic acid, plasma     Status: Abnormal   Collection Time: May 15, 2016  8:38 AM  Result Value Ref Range   Lactic Acid, Venous 5.5 (HH) 0.5 - 2.0 mmol/L    Comment: CRITICAL RESULT CALLED TO, READ BACK BY AND VERIFIED WITH: JOHNSON,R. RN AT [phone] 15-May-2016 MULLINS,T     Ct Abdomen Pelvis Wo Contrast  05/15/16  CLINICAL DATA:  Abdominal pain. History bladder cancer. CLL on chemotherapy. Contrast allergy. EXAM: CT ABDOMEN AND PELVIS WITHOUT CONTRAST TECHNIQUE: Multidetector CT imaging of the abdomen and pelvis was performed following the standard protocol without IV contrast. COMPARISON:  CT abdomen pelvis 04/26/2016 FINDINGS: Lower chest: Mild bronchial wall thickening in the lung bases left greater than right. This shows mild progression from the prior study and could reflect bronchitis. No lobar pneumonia or effusion. Hepatobiliary: Portal venous gas has developed since the prior study. No focal liver mass lesion. Liver size normal. Gallbladder and bile ducts nondilated. Negative for pneumoperitoneum. Pancreas: Negative Spleen: Small spleen without focal lesion Adrenals/Urinary Tract: No renal mass or obstruction. Nonobstructing bilateral renal calculi. Layering calcifications in the bladder compatible with bladder calculi. These were not present on the prior CT. Stomach/Bowel: Negative for bowel obstruction. Mild pneumatosis in the stomach. Curvilinear extraluminal gas is seen around the stomach most compatible with venous gas. No gastric thickening or mass lesion. Pneumatosis also present in small bowel and colon. Negative for bowel obstruction.  Sigmoid diverticulosis without diverticulitis. Moderate hiatal hernia Vascular/Lymphatic: Atherosclerotic calcification in the aorta and iliac arteries. No aneurysm. Improvement in mesenteric adenopathy since the prior study. Lymph nodes are now all under 1 cm. Perirectal lymph node also significantly improved following chemotherapy. Reproductive: Prostate enlargement with prostate calcification Other: Mild amount of free fluid around the liver. Small amount of free fluid between bowel loops in the abdomen. Negative for pneumoperitoneum. Musculoskeletal: Grade 1 slip L5-S1 with bilateral pars defects of L5. No fracture or focal bony lesion. IMPRESSION: Portal venous gas in the liver. There is also venous gas around the stomach. No gastric edema. There is pneumatosis involving the colon and small bowel. Small amount of free fluid around the liver. Differential diagnosis includes bowel ischemia. Benign pneumatosis also in the differential. Currently the patient has abdominal pain. Surgical consultation recommended. Improvement in mesenteric and abdominal adenopathy since 04/26/2016 related to chemotherapy. These results were  called by telephone at the time of interpretation on 05-13-2016 at 10:04 am to Dr. Niel Hummer , who verbally acknowledged these results. Electronically Signed   By: Franchot Gallo M.D.   On: 05-13-2016 10:05   Dg Chest 2 View  2016-05-13  CLINICAL DATA:  Shortness of breath and upper abdominal pain. EXAM: CHEST  2 VIEW COMPARISON:  02/28/2016 FINDINGS: Postoperative changes in the mediastinum. Normal heart size and pulmonary vascularity. No focal airspace disease or consolidation in the lungs. No blunting of costophrenic angles. No pneumothorax. Mediastinal contours appear intact. Degenerative changes in the thoracic spine. Old left rib fractures. Calcification of the aorta. Degenerative changes in the right shoulder. IMPRESSION: No active cardiopulmonary disease. Electronically Signed    By: Lucienne Capers M.D.   On: 05/13/16 02:32   Dg Abd 1 View  2016/05/13  CLINICAL DATA:  Acute onset of lower mid abdominal pain. No bowel movements for 1 week. Initial encounter. EXAM: ABDOMEN - 1 VIEW COMPARISON:  CT of the abdomen and pelvis from 04/26/2016 FINDINGS: The visualized bowel gas pattern is unremarkable. Scattered air and stool filled loops of colon are seen; no abnormal dilatation of small bowel loops is seen to suggest small bowel obstruction. No free intra-abdominal air is identified, though evaluation for free air is limited on a single supine view. Mild degenerative change is noted along the lumbar spine; the sacroiliac joints are unremarkable in appearance. Chronic sclerotic change is noted at the pubic symphysis and sacroiliac joints. Clips are noted overlying the right upper quadrant. Residual contrast is seen along sigmoid diverticula. IMPRESSION: Unremarkable bowel gas pattern; no free intra-abdominal air seen. Small to moderate amount of stool noted in the colon. Electronically Signed   By: Garald Balding M.D.   On: 2016-05-13 03:47    Review of Systems  Unable to perform ROS Constitutional:       Mostly from his wife, he is in significant pain and getting pain meds.  Very hard or hearing.   Blood pressure 91/45, pulse 97, temperature 97.5 F (36.4 C), temperature source Axillary, resp. rate 24, height 5' 8"  (1.727 m), weight 74.39 kg (164 lb), SpO2 93 %. Physical Exam  Constitutional: He is oriented to person, place, and time. He appears distressed.  Elderly male in acute distress.  He is in enough pain that he is agreeable to CPR, Vent, and possible surgery.  HENT:  Head: Normocephalic and atraumatic.  Eyes: Right eye exhibits no discharge. Left eye exhibits no discharge. No scleral icterus.  Neck: No JVD present. No tracheal deviation present. No thyromegaly present.  Some mass right parotid  1.5 cm  Cardiovascular: Regular rhythm.   Tachycardic In Sinus  rhythm   Respiratory: Effort normal and breath sounds normal. No respiratory distress. He has no wheezes. He has no rales. He exhibits no tenderness.  GI: He exhibits distension. There is tenderness. There is rebound and guarding.  Acutely tender to any touch or movement. No bowel sounds   Musculoskeletal: He exhibits no edema or tenderness.  Both feet are cold and no distal pulses  Lymphadenopathy:    He has no cervical adenopathy.  Neurological: He is alert and oriented to person, place, and time. No cranial nerve deficit.  Skin: Skin is dry. No rash noted. No erythema. There is pallor.  Psychiatric:  In acute discomfort and mentating as well as possible under circumstances.   .  sodium bicarbonate  infusion 1000 mL 100 mL/hr at 2016/05/13 [phone]  Assessment/Plan: Acute abdomen with portal vein gas, colon and small bowel pneumatosis. Progressive lactate acidosis Early sepsis Concern of ischemic bowel Acute on chronic renal disease CLL with chemotherapy  6/8 & 05/04/16 On steroids Lymphoma ? CAD with prior CABG 1988 and 2014 Carotid disease Hx of tobacco use/COPD Hypertension Parotid resection on the left and mass on the right.   Plan:  I talked with the patient and his wife, he is hard of hearing but I think he understands and wants something done.  We think he has ischemic bowel and will need exploratory laparotomy, probable colostomy.  He wants full Code status and knows he is a very poor operative risk with his multiple co morbidities. Dr. Lucia Gaskins is seeing him now.     JENNINGS,WILLARD May 13, 2016, 10:08 AM   I spoke with Dr. Rolla Etienne about the patient. I spoke with Dr. Marin Olp about the patient.  He is being treated for CLL - received his first treatment last week.  The wife says he started complaining of abdominal pain about the time of last week's treatment.  His CT scan is ominous with air in the portal venous system.  He has apparent pneumatosis involving the distal  small bowel and right colon.  He has an increasing lactic acid. He probably has ischemic bowel.  He was given heparin this AM in the expectation that he was having a PE.  The heparin was stopped around 11 AM - will go ahead with surgery.  I talked to the patient and his wife.  He wants to go ahead with surgery.  I explained the risk which include bleeding, ostomy, and not surviving the operation.  I talked to his son on the phone.  His daughter has MS and lives with them.  Will go ahead with surgery today.  Alphonsa Overall, MD, [facility] Surgery Pager: [phone] Office phone:  [phone]

## 2016-05-26 NOTE — Significant Event (Signed)
Rapid Response Event Note  Overview:      Initial Focused Assessment:   Interventions:  Plan of Care (if not transferred):  Event Summary: RRT called to 1520 for pt with tachypnea, high lactic acid, and hypotension. Pt supine in bed upon RRT arrival with c/o 10/10 sharp abdominal pain, extremely sensitive to touch  over 4 quadrants of abdomen. His wife is at bedside and tells me the ED told her he was impacted, he states he had 2 small liquid stools yesterday. His first chemo tx was 08/12/16 at our [facility] with Dr Marin Olp as his Oncologist. NS bolus hanging at 999cc/hr. (Second 500 cc bolus).Stat CT of abd ordered. Initial SBP 80's falling to 60/19(31), NSR 90-102, RR 24, sats 90. A&O, FC, Lt facial droop with left eye not closing when blinking, he states he had surgery, there is a healing incision over left carotid area of his neck. Lungs are clear but diminished, BS + x 4 quadrants, BLE cool to touch with weak pulses palpated. SBP returning to 80's and remained 82-90 during CT and transport to SD unit.    at      at          [place], [place]

## 2016-05-26 NOTE — ED Notes (Signed)
Bed: RESB Expected date:  Expected time:  Means of arrival:  Comments: EMS 82 yo male SOB

## 2016-05-26 NOTE — Progress Notes (Signed)
Pt BP 91/45. MD made aware of pt status and Rapid Response Team called. Pt transported down to CT for stat CT abdomen and transported to ICU room 1225. Report given to Rubie, Therapist, sports. Pt family members aware of pt transfer. No questions or concerns at this time.  Rozelia Catapano W Zehava Turski, RN

## 2016-05-26 NOTE — Progress Notes (Addendum)
PROGRESS NOTE    [name] SEPTER  [id number] DOB: 09-16-1931 DOA: 05-11-16 PCP: Olin Hauser, MD    Brief Narrative: [name] [name] is a 82 y.o. male w/ past medical history significant for CLL and facial nerve weakness following parotid gland surgery. Patient states that he has been having abdominal pain for several days. He presented on the first with the same complaint. Patient states that the pain has not been any better. And to the point where hurts to move. Patient states he thinks it makes him feel short of breath. Denies any chest pain. No nausea and vomiting. Denies fever, chills, cough, wheeze.   Assessment & Plan:   Principal Problem:   Acute respiratory failure with hypoxia (HCC) Active Problems:   Chronic kidney disease (CKD), stage III (moderate)   GERD (gastroesophageal reflux disease)   CLL (chronic lymphocytic leukemia) (HCC)   Diastolic heart failure (HCC)   HLD (hyperlipidemia)   Constipation   Metabolic acidosis   Facial nerve palsy   CAD (coronary artery disease)   BPH (benign prostatic hyperplasia)   Gout  1-Sepsis; patient with leukopenia, hypotension, metabolic acidosis, lactic acid at 4.  Start IV zosyn to cover for intra-abdominal infection.  IV fluids, change to bicarb Gtt.  Transfer to step down unit.  2-Abdominal pain, lactic acidosis.  IV fentanyl PRN.  IV bicarb Gtt.  CT abdomen and pelvis.  Call by radiologist patient has Pneumatosis, bowel wall, portal vein gas. Surgery consulted.   3-AKI; on chronic kidney diseases; stage III Last cr per records at 1.6----1.9 Continue with IV fluids.   4-CLL; recent chemo. He is on allopurinol.  Dr Marin Olp consulted    5-Acute hypoxic respiratory failure.  V-Q pending.  On heparin GTtt  6-Facial nerve palsy Continue tetrahydrolozine drops in left eye  7-Chronic Diastolic Congestive heart failure Continue Lopressor 25 mg twice a day, holder parameters for Hypotension.    8-Hyponatremia; IV fluids.  9-hyperkalemia; on IV bicarb Gtt  DVT prophylaxis: on heparin  Code Status: full code.  Family Communication: wife at bedside.  Disposition Plan: Transfer to step down unit    Consultants:   DR Marin Olp.   CCM  Procedures:   none  Antimicrobials;  Zosyn 6-13   Subjective: Patient is alert, answering questions. He is complaining of abdominal pain, generalized, 10/10. He had BM yesterday, multiple times yesterday. Had chemo last week.   Objective: Filed Vitals:   05/11/16 0630 05/11/2016 0700 2016-05-11 0730 2016/05/11 0744  BP: 100/48 102/46 105/45   Pulse:      Temp:      TempSrc:      Resp: 17 19 20    Height:      Weight:      SpO2:    95%   No intake or output data in the 24 hours ending 05/11/16 0819 Filed Weights   05/11/2016 0101  Weight: 74.39 kg (164 lb)    Examination:  General exam: Appears calm and comfortable , appears in pain  Respiratory system: no wheezing . Respiratory effort normal. Cardiovascular system: S1 & S2 heard, RRR. No JVD, murmurs, rubs, gallops or clicks. No pedal edema. Gastrointestinal system: Abdomen is nondistended, very tender to palpation  Central nervous system: Alert and oriented. No focal neurological deficits. Extremities: Symmetric 5 x 5 power. Skin: No rashes, lesions or ulcers Psychiatry: Judgement and insight appear normal. Mood & affect appropriate.     Data Reviewed: I have personally reviewed following labs and imaging studies  CBC:  Recent Labs Lab 05-16-2016 0130  WBC 3.7*  HGB 10.8*  HCT 31.8*  MCV 82.4  PLT 123XX123*   Basic Metabolic Panel:  Recent Labs Lab 05/16/16 0225  NA 128*  K 5.3*  CL 101  CO2 12*  GLUCOSE 221*  BUN 76*  CREATININE 1.93*  CALCIUM 10.1   GFR: Estimated Creatinine Clearance: 28.5 mL/min (by C-G formula based on Cr of 1.93). Liver Function Tests:  Recent Labs Lab 2016/05/16 0225  AST 25  ALT 29  ALKPHOS 100  BILITOT 0.5  PROT 5.3*   ALBUMIN 2.8*    Recent Labs Lab 05-16-2016 0225  LIPASE 63*   No results for input(s): AMMONIA in the last 168 hours. Coagulation Profile: No results for input(s): INR, PROTIME in the last 168 hours. Cardiac Enzymes: No results for input(s): CKTOTAL, CKMB, CKMBINDEX, TROPONINI in the last 168 hours. BNP (last 3 results) No results for input(s): PROBNP in the last 8760 hours. HbA1C: No results for input(s): HGBA1C in the last 72 hours. CBG: No results for input(s): GLUCAP in the last 168 hours. Lipid Profile: No results for input(s): CHOL, HDL, LDLCALC, TRIG, CHOLHDL, LDLDIRECT in the last 72 hours. Thyroid Function Tests: No results for input(s): TSH, T4TOTAL, FREET4, T3FREE, THYROIDAB in the last 72 hours. Anemia Panel: No results for input(s): VITAMINB12, FOLATE, FERRITIN, TIBC, IRON, RETICCTPCT in the last 72 hours. Sepsis Labs:  Recent Labs Lab May 16, 2016 0549  LATICACIDVEN 4.38*    No results found for this or any previous visit (from the past 240 hour(s)).       Radiology Studies: Dg Chest 2 View  2016-05-16  CLINICAL DATA:  Shortness of breath and upper abdominal pain. EXAM: CHEST  2 VIEW COMPARISON:  02/28/2016 FINDINGS: Postoperative changes in the mediastinum. Normal heart size and pulmonary vascularity. No focal airspace disease or consolidation in the lungs. No blunting of costophrenic angles. No pneumothorax. Mediastinal contours appear intact. Degenerative changes in the thoracic spine. Old left rib fractures. Calcification of the aorta. Degenerative changes in the right shoulder. IMPRESSION: No active cardiopulmonary disease. Electronically Signed   By: Lucienne Capers M.D.   On: 05-16-2016 02:32   Dg Abd 1 View  05/16/16  CLINICAL DATA:  Acute onset of lower mid abdominal pain. No bowel movements for 1 week. Initial encounter. EXAM: ABDOMEN - 1 VIEW COMPARISON:  CT of the abdomen and pelvis from 04/26/2016 FINDINGS: The visualized bowel gas pattern is  unremarkable. Scattered air and stool filled loops of colon are seen; no abnormal dilatation of small bowel loops is seen to suggest small bowel obstruction. No free intra-abdominal air is identified, though evaluation for free air is limited on a single supine view. Mild degenerative change is noted along the lumbar spine; the sacroiliac joints are unremarkable in appearance. Chronic sclerotic change is noted at the pubic symphysis and sacroiliac joints. Clips are noted overlying the right upper quadrant. Residual contrast is seen along sigmoid diverticula. IMPRESSION: Unremarkable bowel gas pattern; no free intra-abdominal air seen. Small to moderate amount of stool noted in the colon. Electronically Signed   By: Garald Balding M.D.   On: 2016/05/16 03:47        Scheduled Meds: . ipratropium-albuterol  3 mL Nebulization Q6H  . metoprolol  25 mg Oral BID  . omega-3 acid ethyl esters  1 g Oral Daily  . pantoprazole  20 mg Oral Daily  . sodium chloride flush  3 mL Intravenous Q12H  . tetrahydrozoline  2 drop Left Eye  QHS  . valACYclovir  500 mg Oral Daily   Continuous Infusions: . heparin 1,200 Units/hr (Jun 03, 2016 0603)  .  sodium bicarbonate  infusion 1000 mL       LOS: 0 days    Time spent: 35    Elmarie Shiley, MD [facility] Pager [phone]  If 7PM-7AM, please contact night-coverage [email or web address] Password [facility] June 03, 2016, 8:19 AM

## 2016-05-26 NOTE — Progress Notes (Signed)
I am absolutely shocked as to what has been found with [name].  He was admitted earlier today with abdominal pain. He was septic. A CT of the abdomen was done. The lymphadenopathy seemed to improve. This is no surprise given that he was treated with Rituxan plus bendamustine for CLL.  He was taken to surgery. And this is where the totally unexpected finding was noted. He had diffuse abdominal carcinomatosis. He had infarcted his small bowel. I am sure that he is incredibly hypercoagulable because of this carcinomatosis.  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  Where this cancer came from I am not sure of. 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 I'll think it would be melanoma. Does have very fair skin.  He has had no bowel issues.  I spoke to his family. I know them well. They are very appreciative of the wonderful care that [name] has received by Dr. Lucia Gaskins in by the staff in the ICU. I told them that even in the best of situations, this kind of cancer is not treatable. Even if [name] was a lot younger, this was a problem that there was no solution for. It cannot be resected.  I totally agree with the DO NOT RESUSCITATE order. [name] would not want to live like this and be non-functional and not productive. He has always had a desire to be active.  It looks like he will not make it through the evening. It would not surprise me if he did pass on.  Again I'm absolutely stunned as to what was found. It certainly explains why he was having this abdominal pain. It is amazing that this really never showed up on a CT scan. For what Dr. Lucia Gaskins said, that the areas of tumor which just too small to be picked up on a scan.  This is just a very bitter pill for me to swallow. I does never expected that [name] would have this malignancy. It will be very interesting  to see what the pathology shows.  His family is very strong. They are a very tight family. They will certainly come together and be able to support each other during this incredibly difficult time.  Again, I appreciate so much all the wonderful care that he is getting from everybody died of the ICU.  Lum Keas,  2 Timothy 4:16-18

## 2016-05-26 NOTE — Op Note (Signed)
NAMEMarland [name]  CARION, SWEGER             ACCOUNT NO.:  [id number]  MEDICAL RECORD NO.:  [id number]  LOCATION:  [place]                         FACILITY:  [facility]  PHYSICIAN:  Fenton Malling. Lucia Gaskins, M.D.  DATE OF BIRTH:  [birth year]  DATE OF PROCEDURE:  May 26, 2016                              OPERATIVE REPORT   PREOPERATIVE DIAGNOSIS:  Probable ischemic bowel with abnormal abdominal CT scan, elevated lactic acid, and abdominal pain.  POSTOPERATIVE DIAGNOSIS:  Infarction of approximately one-half of the small bowel with newly diagnosed abdominal carcinomatosis.  PROCEDURE:  Exploratory laparotomy with biopsy of omentum.  Placement of foley catheter - Dr. Clint Lipps  SURGEON:  Fenton Malling. Lucia Gaskins, M.D.  FIRST ASSISTANT:  None.  ANESTHESIA:  General endotracheal.  ESTIMATED BLOOD LOSS:  150 mL.  DRAINS:  None.  SPECIMEN:  Biopsy of the omentum.  INDICATION FOR PROCEDURE:  Mr. Kai is an 82 year old white male, who sees Dr. Olin Hauser as his primary medical doctor and has seen Dr. Burney Gauze recently for CLL, presented today with acutely worsening abdominal pain.  According to his wife, the abdominal pain began sone after the chemotherapy he received last week.   He underwent a CT scan today that showed portal venous gas in the liver, pneumatosis involving the distal small bowel and right colon, and some free fluid.  He is acidotic with exquisite abdominal tenderness.  The whole picture is that of probable ischemic bowel.  A discussion was carried out with the patient and his wife about the options of observational care versus surgery.  It appears the only chance he has of improving would be to operate on his abdomen and find something that is repairable. The risk of surgery including bleeding, infection, which I think he already has, the need for bowel resection with possible ostomy, and the possible this is a nonsurvivable event.  He is an older man with significant medical issues which make any  surgery a significant risk.  He and his wife want to proceed with surgery today.  OPERATIVE NOTE:   The patient was taken to room #2 at [facility], underwent a general endotracheal anesthetic, supervised by Dr. Lillia Abed.  He had an A-line in place and internal jugular line placed.  I tried to place a Foley catheter in him in the ICU, but was unable to pass the foley.  I spoke with Dr. Alexis Frock, who did a cysto and placed the catheter for the patient prior to the laparotomy.  His abdomen was prepped with ChloraPrep and sterilely draped.  A time-out was held and the surgical checklist run.  I made a midline incision and entered into the abdominal cavity.  He had a foul smelling, thin bloody ascites as I opened his abdominal cavity.  This appeared to be ascites secondary to ischemic bowel.    His entire small bowel was matted between extensive tiny nodules, which appeared to be miliary carcinomatosis.  I can palpate the stomach and the NG tube and there were few of these nodules up over the stomach and on the diaphragm.  His liver was grossly clear of any gross disease, but had few of these miliary nodules  But most  of the nodules caked the omentum, the colon, and the small bowel.  And the small bowel and colon were encased in these tiny nodules.    The small bowel, which was enclased in these tumor nodules, had two segment of infarction.  One area of infarction was mid small bowel and one was at the terminal ileum.  Then he had multiple patches of infarction throughout the distal 2/3's of his small bowel.  I estimate that he had at least half of his small bowel infarcted.    I spent time trying to see I if could dissect our his small bowel for a resection, but the small bowel was friable or infarcted.  I was able to free up about half a small bowel.    I biopsied the omentum and sent this to pathology for a frozen section.  Dr. Willeen Niece called me back about this.  The frozen  section showed this is a carcinoma, which is poorly differentiated, and he thought unlikely related to the lymphoma.  I spoke with Dr. Burney Gauze on the phone about the surgical and pathologic findings.  It is unclear the primary source of the carcinomatosis.  I thought that since he had extensive small bowel ischemia and he had this extensive intraabdominal carcinomatosis, that this was not a survivable event, and that further surgery would not provide any benefit. During the case, his pressures ran about 0000000 systolic, so he was hypotensive much of the case, on pressors.  I then made the decision to close his abdomen and not try to do any primary resection.  I closed the abdomen with two running #1 Novafil sutures and I stapled the skin.  He will be transferred back to the ICU for supportive care.  I talked to the family about the findings and diagnosis of both ischemic bowel and advanced intraabdominal carcinomatosis.  He will be made comfort care.   Fenton Malling. Lucia Gaskins, M.D., [facility], Scribe for Epic   DHN/MEDQ  D:  2016-05-26  T:  05/09/2016  Job:  [id number]  cc:   Olin Hauser, MD Fax: [phone]  Marshall Cork. Jeffie Pollock, M.D. Fax: [place]. Stanford Breed, MD, [facility]  [name], M.D. Fax: [phone]  Alexis Frock, MD Fax: [phone]

## 2016-05-26 NOTE — Progress Notes (Signed)
Pt extubated to room air with RN, RT, and family at bedside at 73. Pt monitor notified of asystole at 2206. Worthy Keeler, RN and Benjamin Stain, RN auscultated for 2 minutes each with no heart or breath sounds noted. Family was informed of patients passing. Family expressed gratitude for nursing and RT assistance with carrying out the patients wishes. Will continue to provide support and comfort to the family as they grieve at the bedside. Luther Parody, RN

## 2016-05-26 NOTE — Progress Notes (Signed)
Late entry - Nursing received from OR , Neo drip infusing wide open , aline left radial, right IJ TLC, foley , UO  200cc clear yellow urine, NG to LIS small amount of BRB.  Temp 93 rectal, warming blanket placed on patient, family at bedisde, aware of patients critical condition.

## 2016-05-26 NOTE — ED Notes (Signed)
Patient is from home and transported via [facility]. Patient is complaining of shortness of breath that started yesterday evening. Also, has upper abd pain that could be related to constipation. Pt's last bowel movement was 4 days ago and pt administered an enema 2 days ago with no relief. Pt was initially 92%-93% on room air. EMS initiated oxygen via [place] at 2L.

## 2016-05-26 NOTE — Consult Note (Addendum)
PULMONARY / CRITICAL CARE MEDICINE   Name: [name] [name] MRN: [id number] DOB: 1931-01-27    ADMISSION DATE:  05/28/16 CONSULTATION DATE:  2016-05-28  REFERRING MD:  Burnett Corrente MD  CHIEF COMPLAINT:  Abdominal pain  HISTORY OF PRESENT ILLNESS:   [name] [name] is a 82 year old with past medical history of chronic kidney disease, coronary artery disease, asthma, lt facial nerve palsy, CLL, hypogammaglobulinemia, recurrent infections. Admitted with dyspnea, severe abdominal pain, AKI, elevated LA. CT abd concerning for bowel infarction. Transferred to ICU 6/13 AM  Pt had CLL with recent CT abd showing progression of abdominal lymphadenopathy. He had been getting IVIG for hypogammaglobulinemia and started on chemo with Rituxan/dendamusitne last week by Dr. Marin Olp. He is on prednisone as an out patient for gout and chemo.   PAST MEDICAL HISTORY :  He  has a past medical history of HYPERLIPIDEMIA; GOUT; HYPERTENSION; CAD; TRANSIENT ISCHEMIC ATTACK; NEPHROLITHIASIS; CKD (chronic kidney disease), stage III; CONTRAST DYE ALLERGY, HX OF; HOH (hard of hearing); Cancer [facility]) (2013); Asthma; Hypogammaglobulinemia ([place]) (10/21/2015); CLL (chronic lymphocytic leukemia) ([place]) (10/21/2015); and Skin cancer.  PAST SURGICAL HISTORY: He  has past surgical history that includes Coronary artery bypass graft (1986); carotid endarterectomy; Coronary artery bypass graft (1996); Total knee arthroplasty (10/2009); Mass excision (05/27/2012); and left heart catheterization with coronary/graft angiogram (N/A, 09/01/2013).  Allergies  Allergen Reactions  . Uloric [Febuxostat] Other (See Comments)    Pt states "it locked me up, I couldn't move from the waist up" Upper body paralysis-temporary  . Ivp Dye [Iodinated Diagnostic Agents] Other (See Comments)    Patient continued sneezing   . Statins Other (See Comments)    Patient unable to walk  . Meloxicam Rash    No current facility-administered medications on file  prior to encounter.   Current Outpatient Prescriptions on File Prior to Encounter  Medication Sig  . allopurinol (ZYLOPRIM) 100 MG tablet Take 100 mg by mouth daily as needed (for flareup).   Marland Kitchen amLODipine (NORVASC) 5 MG tablet Take 1 tablet (5 mg total) by mouth daily.  Marland Kitchen aspirin EC 81 MG tablet Take 1 tablet (81 mg total) by mouth daily.  . calcium carbonate (TUMS - DOSED IN MG ELEMENTAL CALCIUM) 500 MG chewable tablet Chew 1 tablet by mouth at bedtime.   . diphenhydrAMINE (BENADRYL) 25 MG tablet Take 50 mg by mouth at bedtime.   . enalapril (VASOTEC) 10 MG tablet Take 1 tablet (10 mg total) by mouth daily.  . famciclovir (FAMVIR) 250 MG tablet Take 1 tablet (250 mg total) by mouth daily.  Marland Kitchen HYDROcodone-acetaminophen (NORCO/VICODIN) 5-325 MG tablet Take 2 tablets by mouth every 4 (four) hours as needed.  Marland Kitchen ibuprofen (ADVIL,MOTRIN) 200 MG tablet Take 200-400 mg by mouth every 6 (six) hours as needed for fever, headache, mild pain, moderate pain or cramping.  . metoprolol (LOPRESSOR) 50 MG tablet Take 0.5 tablets (25 mg total) by mouth 2 (two) times daily.  . mupirocin ointment (BACTROBAN) 2 % Place 1 application into the nose 2 (two) times daily as needed (for dermatitis).  . nitroGLYCERIN (NITROSTAT) 0.4 MG SL tablet Place 1 tablet (0.4 mg total) under the tongue every 5 (five) minutes as needed for chest pain.  . Omega-3 Fatty Acids (FISH OIL) 1000 MG CAPS Take 1 capsule by mouth daily.  . ondansetron (ZOFRAN) 8 MG tablet Take 1 tablet (8 mg total) by mouth 2 (two) times daily as needed for refractory nausea / vomiting. Start on day 2 after chemo.  Marland Kitchen  pantoprazole (PROTONIX) 20 MG tablet Take 1 tablet (20 mg total) by mouth daily.  . predniSONE (DELTASONE) 5 MG tablet Take 5 mg by mouth daily as needed (for gout flare ups).  . Tamsulosin HCl (FLOMAX) 0.4 MG CAPS Take 0.4 mg by mouth daily as needed (for urination). Reported on 03/21/2016  . Tetrahydrozoline HCl (EYE DROPS OP) Place 1 drop into  the left eye at bedtime. Unknown name of eye drop  . dexamethasone (DECADRON) 2 MG tablet Take 1 tablet (2 mg total) by mouth 2 (two) times daily with a meal. (Patient not taking: Reported on 06/01/2016)  . dexamethasone (DECADRON) 4 MG tablet Take 2 tablets (8 mg total) by mouth daily. Start the day after bendamustine chemotherapy for 2 days. Take with food. (Patient not taking: Reported on Jun 01, 2016)    FAMILY HISTORY:  His indicated that his father is deceased.   SOCIAL HISTORY: He  reports that he quit smoking about 50 years ago. His smoking use included Cigarettes. He started smoking about 63 years ago. He has a 13 pack-year smoking history. He has never used smokeless tobacco. He reports that he does not drink alcohol or use illicit drugs.  REVIEW OF SYSTEMS:   Diffuse abdominal pain, constipation. Denies any nausea, vomiting. Positive for dyspnea. Denies any cough, sputum production, wheeze, hemoptysis. Denies any chest pain, palpitation. Denies any fevers, chills, malaise, loss of appetite. All other review of systems negative   SUBJECTIVE:    VITAL SIGNS: BP 91/45 mmHg  Pulse 97  Temp(Src) 97.3 F (36.3 C) (Oral)  Resp 24  Ht 5\' 8"  (1.727 m)  Wt 164 lb (74.39 kg)  BMI 24.94 kg/m2  SpO2 93%  HEMODYNAMICS:    VENTILATOR SETTINGS:    INTAKE / OUTPUT:    PHYSICAL EXAMINATION: General:  Moderate distress, elderly male Neuro:  Left facial droop. Moves all 4 extremities. No gross focal deficit. HEENT:  Moist mucous membrane, no thyromegaly, JVD Cardiovascular:  Tachycardia, regular rate and rhythm, no MRG Lungs:  Clear, no wheezes, crackles Abdomen:  Distended, acutely tender all over. positive for guarding, rigidity. Reduced bowel sounds. Musculoskeletal:  Normal tone and bulk Skin:  Intact  LABS:  BMET  Recent Labs Lab 01-Jun-2016 0225  NA 128*  K 5.3*  CL 101  CO2 12*  BUN 76*  CREATININE 1.93*  GLUCOSE 221*    Electrolytes  Recent Labs Lab  06-01-16 0225  CALCIUM 10.1    CBC  Recent Labs Lab 06-01-2016 0130  WBC 3.7*  HGB 10.8*  HCT 31.8*  PLT 401*    Coag's No results for input(s): APTT, INR in the last 168 hours.  Sepsis Markers  Recent Labs Lab 2016-06-01 0549  LATICACIDVEN 4.38*    ABG  Recent Labs Lab 06-01-16 0448  PHART 7.361  PCO2ART 22.4*  PO2ART 72.5*    Liver Enzymes  Recent Labs Lab 2016/06/01 0225  AST 25  ALT 29  ALKPHOS 100  BILITOT 0.5  ALBUMIN 2.8*    Cardiac Enzymes No results for input(s): TROPONINI, PROBNP in the last 168 hours.  Glucose No results for input(s): GLUCAP in the last 168 hours.  Imaging Dg Chest 2 View  01-Jun-2016  CLINICAL DATA:  Shortness of breath and upper abdominal pain. EXAM: CHEST  2 VIEW COMPARISON:  02/28/2016 FINDINGS: Postoperative changes in the mediastinum. Normal heart size and pulmonary vascularity. No focal airspace disease or consolidation in the lungs. No blunting of costophrenic angles. No pneumothorax. Mediastinal contours appear intact. Degenerative changes  in the thoracic spine. Old left rib fractures. Calcification of the aorta. Degenerative changes in the right shoulder. IMPRESSION: No active cardiopulmonary disease. Electronically Signed   By: Lucienne Capers M.D.   On: 05/28/2016 02:32   Dg Abd 1 View  2016-05-28  CLINICAL DATA:  Acute onset of lower mid abdominal pain. No bowel movements for 1 week. Initial encounter. EXAM: ABDOMEN - 1 VIEW COMPARISON:  CT of the abdomen and pelvis from 04/26/2016 FINDINGS: The visualized bowel gas pattern is unremarkable. Scattered air and stool filled loops of colon are seen; no abnormal dilatation of small bowel loops is seen to suggest small bowel obstruction. No free intra-abdominal air is identified, though evaluation for free air is limited on a single supine view. Mild degenerative change is noted along the lumbar spine; the sacroiliac joints are unremarkable in appearance. Chronic sclerotic  change is noted at the pubic symphysis and sacroiliac joints. Clips are noted overlying the right upper quadrant. Residual contrast is seen along sigmoid diverticula. IMPRESSION: Unremarkable bowel gas pattern; no free intra-abdominal air seen. Small to moderate amount of stool noted in the colon. Electronically Signed   By: Garald Balding M.D.   On: May 28, 2016 03:47     STUDIES:  CT abd/ pelvis 6/13 >> portal venous gas, venous gas around stomach, Pneumatosis. Concerning for Bowel Ischemia. CXR 6/13 >>  No acute cardiopulmonary disease  CULTURES: Bcx X 2 > 6/13  ANTIBIOTICS: Zosyn 6/13 >  SIGNIFICANT EVENTS:  LINES/TUBES:   DISCUSSION: 82 year old with significant comorbidities including coronary artery disease, chronic steroid use, CLL. Recently started on chemotherapy. Admitted with abdominal pain. Found to have bowel ischemia on CT abdomen. There was a concern for PE on admission and he was started on heparin drip but probabilty for PE is low. I believe his dyspnea and tachypnea is due to abd pain.  ASSESSMENT / PLAN:  PULMONARY A: Resp failure P:   D/C heparin drip Supplemental O2  CARDIOVASCULAR A:  CAD s/p CABG 86, 96 PCI with stent to saphenous graft 2014 Shock, elevated LA P:  Echocardiogram Follow LA, troponin Fluid resuscitation. Bolus 1 lt IVF and drip at 100/hr  RENAL A:   AKI on CKD Mixed AG and NAG acidosis P:   Monitor urine output and Cr Bicarb drip  GASTROINTESTINAL A:   Bowel infarction Abdominal lymphadenopathy Chronic constipation P:   Keep NPO Stat surgical consult  HEMATOLOGIC A:   CLL, on chemo Hypogammaglobilinema Leukopenia P:  Consult Dr. Marin Olp.  INFECTIOUS A:   Peritonitis Septic shock P:   Continue zosyn  ENDOCRINE A:   Chronic steroid use P:   Stress dose steroids  NEUROLOGIC A:   Stable P:   Dilaudid for pain control  FAMILY  - Updates: Patient and wife updated at bedside. I told him that his  prognosis is very poor and he may not survive this admission. He wants everything done including life support and CPR - Inter-disciplinary family meet or Palliative Care meeting due by:  6/20.  Critical care time- 35 mins.  Marshell Garfinkel MD [facility] Pager [phone] If no answer or after 3pm call: [phone] May 28, 2016, 10:41 AM

## 2016-05-26 NOTE — Progress Notes (Signed)
220cc of Fentanyl wasted in the sink with Gae Gallop, RN as a witness. Luther Parody, RN

## 2016-05-26 NOTE — Progress Notes (Signed)
Family has requested that we proceed with extubation and withdrawal of care at this time. Elink notified of the families wishes. Awaiting further orders. Luther Parody, RN

## 2016-05-26 NOTE — Progress Notes (Signed)
   2016-05-30 1100  Clinical Encounter Type  Visited With Patient and family together  Visit Type Initial;Spiritual support;Critical Care  Referral From Nurse  Consult/Referral To Chaplain  Spiritual Encounters  Spiritual Needs Emotional;Prayer;Other (Comment) (Pastoral Conversation/Support)  Stress Factors  Patient Stress Factors None identified  Family Stress Factors Health changes;Other (Comment);Lack of knowledge   I visited with the patient and his wife per referral by the nurse who stated that the patient had not received a good prognosis.  I was told that the patient is hard of hearing. The wife was at the bedside when I arrived. The wife claimed that she is praying for complete healing for her husband. The wife had been trying to get in touch with her pastor at [facility] in [place]. She was unable to get in touch with him and asked me to help contact the pastor. She gave me permission to contact the pastor, tell him what room number the patient is in and what is going on with him.  She requested prayer and we prayed. At this time, she states that her Theodoro Kos is a support for she and her husband.  I was able to contact the pastor, Livia Snellen, and he stated that he would be up here shortly. I let the staff know on the floor that the pastor was on his way.  I will follow up with this patient and his wife.    Banks M.Div.

## 2016-05-26 NOTE — Anesthesia Procedure Notes (Addendum)
Central Venous Catheter Insertion Performed by: anesthesiologist May 30, 2016 1:15 PM Patient location: OR. Preanesthetic checklist: patient identified, IV checked, site marked, risks and benefits discussed, surgical consent, monitors and equipment checked, pre-op evaluation, timeout performed and anesthesia consent Position: Trendelenburg Lidocaine 1% used for infiltration Landmarks identified and Seldinger technique used Catheter size: 7.5 Fr Central line was placed.Triple lumen Procedure performed using ultrasound guided technique. Attempts: 1 Following insertion, line sutured, dressing applied and Biopatch. Post procedure assessment: blood return through all ports, free fluid flow and no air. Patient tolerated the procedure well with no immediate complications.   Procedure Name: Intubation Date/Time: 05/30/2016 2:02 PM Performed by: Montel Clock Pre-anesthesia Checklist: Patient identified, Emergency Drugs available, Suction available, Patient being monitored and Timeout performed Patient Re-evaluated:Patient Re-evaluated prior to inductionOxygen Delivery Method: Circle system utilized Preoxygenation: Pre-oxygenation with 100% oxygen Intubation Type: IV induction, Rapid sequence and Cricoid Pressure applied Laryngoscope Size: Mac and 3 Grade View: Grade I Tube type: Oral Tube size: 7.5 mm Number of attempts: 1 Airway Equipment and Method: Stylet Placement Confirmation: ETT inserted through vocal cords under direct vision,  positive ETCO2 and breath sounds checked- equal and bilateral Secured at: 23 cm Tube secured with: Tape Dental Injury: Teeth and Oropharynx as per pre-operative assessment

## 2016-05-26 NOTE — Brief Op Note (Signed)
05/18/16  2:14 PM  PATIENT:  [name] [name]  82 y.o. male  PRE-OPERATIVE DIAGNOSIS:  urethral dilation bowel obstruction  POST-OPERATIVE DIAGNOSIS:  Severe BPH  PROCEDURE:  Cysto, urethral dilation, catheter placement  SURGEON:  Surgeon(s) and Role: Panel 1:    * Alphonsa Overall, MD - Primary  Panel 2:    * Alexis Frock, MD - Primary  PHYSICIAN ASSISTANT:   ASSISTANTS: none   ANESTHESIA:   general  EBL:  Total I/O In: 340.4 [I.V.:290.4; IV Piggyback:50] Out: -   BLOOD ADMINISTERED:none  DRAINS: 90 F foley to gravity drain  LOCAL MEDICATIONS USED:  NONE  SPECIMEN:  No Specimen  DISPOSITION OF SPECIMEN:  N/A  COUNTS:  YES  TOURNIQUET:  * No tourniquets in log *  DICTATION: .Other Dictation: Dictation Number [id number]  PLAN OF CARE: Admit to inpatient   PATIENT DISPOSITION:  remain in OR for general surgery portions of procedure   Delay start of Pharmacological VTE agent (>24hrs) due to surgical blood loss or risk of bleeding: yes

## 2016-05-26 NOTE — ED Provider Notes (Signed)
CSN: [id number]     Arrival date & time 2016-06-06  0056 History  By signing my name below, I, [name] [name], attest that this documentation has been prepared under the direction and in the presence of [name] Agosto, MD.  Electronically Signed: Tedra [name]. Sheppard Coil, ED Scribe. 2016-06-06. 2:43 AM.    Chief Complaint  Patient presents with  . Shortness of Breath    Patient is a 82 y.o. male presenting with shortness of breath. The history is provided by the patient. No language interpreter was used.  Shortness of Breath Severity:  Moderate Onset quality:  Sudden Timing:  Constant Progression:  Unchanged Chronicity:  New Context: not activity   Relieved by:  Nothing Worsened by:  Nothing tried Ineffective treatments:  None tried Associated symptoms: abdominal pain   Associated symptoms: no chest pain, no fever, no sore throat and no wheezing   Risk factors: no hx of PE/DVT and no recent surgery    HPI Comments: [name] [name] is a 82 y.o. male, brought in by [facility], with PMHx of Leukemia, Lymphoma, HTN, CAD CKD, and TIA who presents to the Emergency Department complaining of gradual onset, constant, shortness of breath x 1 day. Per pt's wife, pt went to chemotherapy sessions on 05/03/16 and 05/04/16. She reports pt having associated episodes of vomiting that occurred on 05/08/15 in addition to shortness of breath. [facility] placed 2L via [place] of O2 on patient en route to [facility] since his saturation was between 92-93% on room air.   Pt also complains of upper abdominal pain due to potential constipation. Per pt's wife, she gave pt Ibuprofen for pain with mild relief. His last bowel movement was 4 days ago and he received an enema 2 days ago with no relief.   Past Medical History  Diagnosis Date  . HYPERLIPIDEMIA   . GOUT   . HYPERTENSION   . CAD     a. s/p CABG x 4 1988;  b. s/p redo CABG x 4 1996;  c. 04/2006 Cath/PCI: LM nl, LAD 100p, LCX 100, RCA 100, LIMA->LAD 40 anast, VG->LCX 95p (3.5x24  [place] BMS), RIMA->RCA ok;  d. 12/2012 MV: smal area of ischemia in basal inferiolateral wall.  . TRANSIENT ISCHEMIC ATTACK   . NEPHROLITHIASIS   . CKD (chronic kidney disease), stage III   . CONTRAST DYE ALLERGY, HX OF   . HOH (hard of hearing)     a. uses hearing aids  . Cancer [facility]) 2013    bladder cancer, skin cancer  . Asthma   . Hypogammaglobulinemia ([place]) 10/21/2015  . CLL (chronic lymphocytic leukemia) ([place]) 10/21/2015  . Skin cancer    Past Surgical History  Procedure Laterality Date  . Coronary artery bypass graft  1986  . Carotid endarterectomy    . Coronary artery bypass graft  1996  . Total knee arthroplasty  10/2009    rt  . Mass excision  05/27/2012    Procedure: EXCISION MASS;  Surgeon: Theodoro Kos, DO;  Location: [place];  Service: Plastics;  Laterality: N/A;  repair of upper lip defect pt is post op  excision of skin cancer with tissue rearrangement or lip switch.  . Left heart catheterization with coronary/graft angiogram N/A 09/01/2013    Procedure: LEFT HEART CATHETERIZATION WITH Beatrix Fetters;  Surgeon: Peter M Martinique, MD;  Location: [facility] CATH LAB;  Service: Cardiovascular;  Laterality: N/A;   Family History  Problem Relation Age of Onset  . Heart attack Father   .  Heart disease Sister   . Stroke Brother    Social History  Substance Use Topics  . Smoking status: Former Smoker -- 1.00 packs/day for 13 years    Types: Cigarettes    Start date: 02/06/1953    Quit date: 11/26/1965  . Smokeless tobacco: Never Used     Comment: quit smoking 46 years ago  . Alcohol Use: No    Review of Systems  Constitutional: Negative for fever.  HENT: Negative for sore throat.   Respiratory: Positive for shortness of breath. Negative for wheezing and stridor.   Cardiovascular: Negative for chest pain, palpitations and leg swelling.  Gastrointestinal: Positive for abdominal pain and constipation.  All other systems reviewed and are  negative.   Allergies  Uloric; Ivp dye; Statins; and Meloxicam  Home Medications   Prior to Admission medications   Medication Sig Start Date End Date Taking? Authorizing Provider  allopurinol (ZYLOPRIM) 100 MG tablet Take 100 mg by mouth daily as needed (for flareup).     Historical Provider, MD  amLODipine (NORVASC) 5 MG tablet Take 1 tablet (5 mg total) by mouth daily. 07/27/15   Lelon Perla, MD  amoxicillin (AMOXIL) 500 MG capsule Take 500 mg by mouth 3 (three) times daily. Started 05/30 for 10 days    Historical Provider, MD  aspirin EC 81 MG tablet Take 1 tablet (81 mg total) by mouth daily. 03/21/16   Lelon Perla, MD  calcium carbonate (TUMS - DOSED IN MG ELEMENTAL CALCIUM) 500 MG chewable tablet Chew 1 tablet by mouth at bedtime.     Historical Provider, MD  dexamethasone (DECADRON) 2 MG tablet Take 1 tablet (2 mg total) by mouth 2 (two) times daily with a meal. 04/26/16   Samantha Tripp Dowless, PA-C  dexamethasone (DECADRON) 4 MG tablet Take 2 tablets (8 mg total) by mouth daily. Start the day after bendamustine chemotherapy for 2 days. Take with food. 05/02/16   [name], MD  diphenhydrAMINE (BENADRYL) 25 MG tablet Take 50 mg by mouth at bedtime.     Historical Provider, MD  enalapril (VASOTEC) 10 MG tablet Take 1 tablet (10 mg total) by mouth daily. 07/27/15   Lelon Perla, MD  famciclovir (FAMVIR) 250 MG tablet Take 1 tablet (250 mg total) by mouth daily. 04/30/16   [name], MD  HYDROcodone-acetaminophen (NORCO/VICODIN) 5-325 MG tablet Take 2 tablets by mouth every 4 (four) hours as needed. 04/26/16   Samantha Tripp Dowless, PA-C  ibuprofen (ADVIL,MOTRIN) 200 MG tablet Take 200-400 mg by mouth every 6 (six) hours as needed for fever, headache, mild pain, moderate pain or cramping.    Historical Provider, MD  metoprolol (LOPRESSOR) 50 MG tablet Take 0.5 tablets (25 mg total) by mouth 2 (two) times daily. 07/27/15   Lelon Perla, MD  mupirocin ointment  (BACTROBAN) 2 % Place 1 application into the nose 2 (two) times daily as needed (for dermatitis).    Historical Provider, MD  nitroGLYCERIN (NITROSTAT) 0.4 MG SL tablet Place 1 tablet (0.4 mg total) under the tongue every 5 (five) minutes as needed for chest pain. 07/27/15   Lelon Perla, MD  Omega-3 Fatty Acids (FISH OIL) 1000 MG CAPS Take 1 capsule by mouth daily.    Historical Provider, MD  ondansetron (ZOFRAN) 8 MG tablet Take 1 tablet (8 mg total) by mouth 2 (two) times daily as needed for refractory nausea / vomiting. Start on day 2 after chemo. 05/02/16   [name],  MD  pantoprazole (PROTONIX) 20 MG tablet Take 1 tablet (20 mg total) by mouth daily. 04/26/16   Samantha Tripp Dowless, PA-C  predniSONE (DELTASONE) 5 MG tablet Take 5 mg by mouth daily as needed (for gout flare ups).    Historical Provider, MD  Red Yeast Rice Extract (RED YEAST RICE PO) Take 1 tablet by mouth daily.    Historical Provider, MD  Tamsulosin HCl (FLOMAX) 0.4 MG CAPS Take 0.4 mg by mouth daily as needed (for urination). Reported on 03/21/2016    Historical Provider, MD  Tetrahydrozoline HCl (EYE DROPS OP) Place 1 drop into the left eye at bedtime. Unknown name of eye drop    Historical Provider, MD   BP 134/66 mmHg  Pulse 82  Temp(Src) 97.3 F (36.3 C) (Oral)  Resp 18  Ht 5\' 8"  (1.727 m)  Wt 164 lb (74.39 kg)  BMI 24.94 kg/m2  SpO2 97% Physical Exam  Constitutional: He is oriented to person, place, and time. He appears well-developed and well-nourished. No distress.  HENT:  Head: Normocephalic and atraumatic.  Mouth/Throat: Oropharynx is clear and moist. No oropharyngeal exudate.  Moist mucous membranes   Eyes: Conjunctivae are normal. Pupils are equal, round, and reactive to light.  Neck: Normal range of motion. Neck supple. No JVD present.  Trachea midline No bruit  Cardiovascular: Normal rate, regular rhythm and normal heart sounds.   Pulmonary/Chest: Effort normal. No stridor. No respiratory  distress. He has decreased breath sounds. He has no wheezes. He has no rales.  Abdominal: Soft. Bowel sounds are normal. He exhibits no distension. There is tenderness. There is no rebound and no guarding.  Diffuse abdominal tenderness.  Musculoskeletal: Normal range of motion. He exhibits no edema or tenderness.  All compartments soft bilaterally. No pedal edema.  Lymphadenopathy:    He has no cervical adenopathy.  Neurological: He is alert and oriented to person, place, and time. He has normal reflexes.  Skin: Skin is warm and dry.  Psychiatric: He has a normal mood and affect. His behavior is normal.  Nursing note and vitals reviewed.   ED Course  Procedures (including critical care time) DIAGNOSTIC STUDIES: Oxygen Saturation is 97% on 2L/min via [place], normal by my interpretation.    COORDINATION OF CARE: 1:43 AM-Discussed treatment plan which includes CXR, CBC panel, CMP, UA and Lipase with pt at bedside and pt agreed to plan.   Labs Review Labs Reviewed  CBC - Abnormal; Notable for the following:    WBC 3.7 (*)    RBC 3.86 (*)    Hemoglobin 10.8 (*)    HCT 31.8 (*)    RDW 17.1 (*)    Platelets 401 (*)    All other components within normal limits  LIPASE, BLOOD  COMPREHENSIVE METABOLIC PANEL  URINALYSIS, ROUTINE W REFLEX MICROSCOPIC (NOT AT [facility])  I-STAT TROPOININ, ED    Imaging Review No results found. I have personally reviewed and evaluated these images and lab results as part of my medical decision-making.   EKG Interpretation None      MDM   Final diagnoses:  None    EKG Interpretation  Date/Time:  May 10, 2016 01:03:39 EDT Ventricular Rate:  77 PR Interval:  145 QRS Duration: 130 QT Interval:  374 QTC Calculation: 423 R Axis:   55 Text Interpretation:  Sinus rhythm Nonspecific intraventricular conduction delay Confirmed by [facility]  MD, Gean Laursen ([id number]) on 04/27/2016 1:57:28 AM      Filed Vitals:   04/27/2016 0200 04/27/2016  0400  BP:  131/52 112/53  Pulse: 82 87  Temp:    Resp: 13 18    Results for orders placed or performed during the hospital encounter of 06-04-2016  CBC  Result Value Ref Range   WBC 3.7 (L) 4.0 - 10.5 K/uL   RBC 3.86 (L) 4.22 - 5.81 MIL/uL   Hemoglobin 10.8 (L) 13.0 - 17.0 g/dL   HCT 31.8 (L) 39.0 - 52.0 %   MCV 82.4 78.0 - 100.0 fL   MCH 28.0 26.0 - 34.0 pg   MCHC 34.0 30.0 - 36.0 g/dL   RDW 17.1 (H) 11.5 - 15.5 %   Platelets 401 (H) 150 - 400 K/uL  Urinalysis, Routine w reflex microscopic  Result Value Ref Range   Color, Urine YELLOW YELLOW   APPearance CLOUDY (A) CLEAR   Specific Gravity, Urine 1.018 1.005 - 1.030   pH 5.0 5.0 - 8.0   Glucose, UA 100 (A) NEGATIVE mg/dL   Hgb urine dipstick TRACE (A) NEGATIVE   Bilirubin Urine NEGATIVE NEGATIVE   Ketones, ur NEGATIVE NEGATIVE mg/dL   Protein, ur NEGATIVE NEGATIVE mg/dL   Nitrite NEGATIVE NEGATIVE   Leukocytes, UA NEGATIVE NEGATIVE  Comprehensive metabolic panel  Result Value Ref Range   Sodium 128 (L) 135 - 145 mmol/L   Potassium 5.3 (H) 3.5 - 5.1 mmol/L   Chloride 101 101 - 111 mmol/L   CO2 12 (L) 22 - 32 mmol/L   Glucose, Bld 221 (H) 65 - 99 mg/dL   BUN 76 (H) 6 - 20 mg/dL   Creatinine, Ser 1.93 (H) 0.61 - 1.24 mg/dL   Calcium 10.1 8.9 - 10.3 mg/dL   Total Protein 5.3 (L) 6.5 - 8.1 g/dL   Albumin 2.8 (L) 3.5 - 5.0 g/dL   AST 25 15 - 41 U/L   ALT 29 17 - 63 U/L   Alkaline Phosphatase 100 38 - 126 U/L   Total Bilirubin 0.5 0.3 - 1.2 mg/dL   GFR calc non Af Amer 31 (L) >60 mL/min   GFR calc Af Amer 36 (L) >60 mL/min   Anion gap 15 5 - 15  Lipase, blood  Result Value Ref Range   Lipase 63 (H) 11 - 51 U/L  Urine microscopic-add on  Result Value Ref Range   Squamous Epithelial / LPF NONE SEEN NONE SEEN   WBC, UA NONE SEEN 0 - 5 WBC/hpf   RBC / HPF 0-5 0 - 5 RBC/hpf   Bacteria, UA NONE SEEN NONE SEEN  I-Stat Troponin, ED (not at [facility])  Result Value Ref Range   Troponin i, poc 0.06 0.00 - 0.08 ng/mL   Comment 3            Ct Abdomen Pelvis Wo Contrast  04/26/2016  CLINICAL DATA:  Abdominal discomfort, distention and firmness. Chronic constipation. History of CLL. EXAM: CT ABDOMEN AND PELVIS WITHOUT CONTRAST TECHNIQUE: Multidetector CT imaging of the abdomen and pelvis was performed following the standard protocol without IV contrast. COMPARISON:  01/02/2016 FINDINGS: Lower chest: Bibasilar scarring versus atelectasis. No definite lower lobe pneumonia. No pericardial or pleural effusion. Normal heart size. Small hiatal hernia noted. Anterior pericardial adenopathy noted, measuring 12 mm in short axis, image 7. These are larger than the prior study. Hepatobiliary: No definite focal hepatic abnormality by noncontrast imaging. No biliary dilatation within the liver. Gallbladder and biliary system remain unremarkable. Small amount of new perihepatic ascites. Pancreas: No mass or inflammatory process identified on this un-enhanced exam. Spleen: Spleen  is normal in size. Small amount of perisplenic ascites. Adrenals/Urinary Tract: Normal adrenal glands by noncontrast imaging. Kidneys demonstrate small similar parapelvic cysts. Punctate nonobstructing left lower pole renal calculus, image 35 measures 3 mm. No renal obstruction or hydronephrosis. No ureteral obstruction or calculus. Chronic perinephric strandy edema. No new finding. Stomach/Bowel: Negative for bowel obstruction, significant dilatation, ileus, or free air. Diverticulosis noted of the colon, most pronounced in the sigmoid within the pelvis. Vascular/Lymphatic: Extensive calcific atherosclerosis of the aorta without aneurysm. No retroperitoneal hemorrhage or acute hematoma. Innumerable central mesenteric lymph nodes. These have increased in both size and number. Index right mesenteric lymph node measures 24 mm, previously 20 mm. Numerous enlarging para-aortic and pericaval lymph nodes also noted. Diffuse mesenteric vascular congestion/ edema evident suspect secondary to the  adenopathy. This has progressed. Small amount of abdominal ascites along the pericolic gutters extends into the pelvis. Enlarged diffuse iliac chain lymph nodes present. Within the pelvis, there is a large perirectal lymph node measuring 16 mm, previously 14 mm. External iliac and inguinal lymph nodes are also larger. Index right inguinal lymph node now measures 14 mm in short axis, previously 12 mm. These findings suggest progression of CLL throughout the abdomen and pelvis. Reproductive: Prostate gland is enlarged with calcification. Seminal vesicles are atrophic. Other: No other inguinal abnormality or hernia. Intact abdominal wall. Musculoskeletal: Degenerative osteophytes of the lower thoracic and lumbar spine. Chronic bilateral L5 pars defects. IMPRESSION: Progression of diffuse central mesenteric, retroperitoneal, iliac and inguinal adenopathy with mesenteric edema/ congestion. Interval development of small amount of abdominal ascites. Appearance is compatible with worsening CLL. No associated obstruction or free air. Other chronic findings are stable as above. Electronically Signed   By: Jerilynn Mages.  Shick M.D.   On: 04/26/2016 17:24   Dg Chest 2 View  2016/05/18  CLINICAL DATA:  Shortness of breath and upper abdominal pain. EXAM: CHEST  2 VIEW COMPARISON:  02/28/2016 FINDINGS: Postoperative changes in the mediastinum. Normal heart size and pulmonary vascularity. No focal airspace disease or consolidation in the lungs. No blunting of costophrenic angles. No pneumothorax. Mediastinal contours appear intact. Degenerative changes in the thoracic spine. Old left rib fractures. Calcification of the aorta. Degenerative changes in the right shoulder. IMPRESSION: No active cardiopulmonary disease. Electronically Signed   By: Lucienne Capers M.D.   On: May 18, 2016 02:32   Dg Abd 1 View  18-May-2016  CLINICAL DATA:  Acute onset of lower mid abdominal pain. No bowel movements for 1 week. Initial encounter. EXAM: ABDOMEN -  1 VIEW COMPARISON:  CT of the abdomen and pelvis from 04/26/2016 FINDINGS: The visualized bowel gas pattern is unremarkable. Scattered air and stool filled loops of colon are seen; no abnormal dilatation of small bowel loops is seen to suggest small bowel obstruction. No free intra-abdominal air is identified, though evaluation for free air is limited on a single supine view. Mild degenerative change is noted along the lumbar spine; the sacroiliac joints are unremarkable in appearance. Chronic sclerotic change is noted at the pubic symphysis and sacroiliac joints. Clips are noted overlying the right upper quadrant. Residual contrast is seen along sigmoid diverticula. IMPRESSION: Unremarkable bowel gas pattern; no free intra-abdominal air seen. Small to moderate amount of stool noted in the colon. Electronically Signed   By: Garald Balding M.D.   On: May 18, 2016 03:47    Medications  albuterol (PROVENTIL) (2.5 MG/3ML) 0.083% nebulizer solution 5 mg (5 mg Nebulization Given 2016-05-18 0149)  fentaNYL (SUBLIMAZE) injection 50 mcg (50 mcg Intravenous Given  2016/05/28 0344)  sodium chloride 0.9 % bolus 500 mL (500 mLs Intravenous New Bag/Given May 28, 2016 0350)    Will need VQ to exclude PE in am.  Cannot angio due to creatinine.  Will start heparin in the interim as PE is highly suspected.      I personally performed the services described in this documentation, which was scribed in my presence. The recorded information has been reviewed and is accurate.    Will admit.     Veatrice Kells, MD May 28, 2016 [phone]

## 2016-05-26 NOTE — ED Notes (Signed)
Patient transported to X-ray 

## 2016-05-26 NOTE — Consult Note (Addendum)
WOC requested for preoperative stoma site marking. Discussed surgical procedure and stoma creation with patient and family.  Explained role of the [place] nurse team.  Provided the patient with educational booklet/DVD and provided samples of pouching options.  Answered patient and family questions.  Examined patient lying, and sitting upright in bed.  He is currently in too much pain to tolerate sitting on the side of the bed or standing. Marking placed in the patient's visual field, away from any creases or abdominal contour issues and within the rectus muscle.  Attempted to mark below the patient's belt line.   Marked for colostomy in the LLQ  __5__ cm to the left of the umbilicus and [id number] below the umbilicus.  Patient's abdomen cleansed with CHG wipes at site markings, allowed to air dry prior to marking. Pt plans for surgery within the hour. [place] team will follow up with patient after surgery for continue ostomy care and teaching if he receives an ostomy.  Julien Girt MSN, RN, [place], [place], [place]

## 2016-05-26 NOTE — ED Notes (Signed)
Assumed care of patient from night shift RN.  Patient's wife at bedside.  Patient alert & oriented.  Lung sounds, coarse crackles/rhonchi throughout. Heart sounds, gallop.  +1 radial pulses.  Abdomen soft, bowel sounds diminished.  +1 pedal pulses bilat, no LE edema noted.

## 2016-05-26 NOTE — ED Notes (Signed)
Unable to find a vein to draw from. Will let RN know.

## 2016-05-26 NOTE — Consult Note (Signed)
Reason for Consult: Difficult Foley, h/o bladder cancer  Referring Physician: Alphonsa Overall MD  UEL FRUIT is an 82 y.o. male.   HPI:   1 - Difficult Foley / Urethral Stricture / Enlarged Prostate - pt cririclally ill from likely bowel infarction undergoing ugent general surgery exploration. NSG and MD unable to place foley and need for accurate UOP monitoring. H/o bladder cancer as per below.   2 - Bladder Cancer - managed previously by Dr. Jeffie Pollock for bladder cancer with bladder-preservaing therapy. No recent recurrences.  Today "[name] [name]" is seen as urgent consult for above, specifically for catheter placemetn as part of exploratory laparotomy today.   Past Medical History  Diagnosis Date  . HYPERLIPIDEMIA   . GOUT   . HYPERTENSION   . CAD     a. s/p CABG x 4 1988;  b. s/p redo CABG x 4 1996;  c. 04/2006 Cath/PCI: LM nl, LAD 100p, LCX 100, RCA 100, LIMA->LAD 40 anast, VG->LCX 95p (3.5x24 [employer] BMS), RIMA->RCA ok;  d. 12/2012 MV: smal area of ischemia in basal inferiolateral wall.  . TRANSIENT ISCHEMIC ATTACK   . NEPHROLITHIASIS   . CKD (chronic kidney disease), stage III   . CONTRAST DYE ALLERGY, HX OF   . HOH (hard of hearing)     a. uses hearing aids  . Cancer [facility]) 2013    bladder cancer, skin cancer  . Asthma   . Hypogammaglobulinemia ([place]) 10/21/2015  . CLL (chronic lymphocytic leukemia) ([place]) 10/21/2015  . Skin cancer     face    Past Surgical History  Procedure Laterality Date  . Coronary artery bypass graft  1986  . Carotid endarterectomy    . Coronary artery bypass graft  1996  . Total knee arthroplasty  10/2009    rt  . Mass excision  05/27/2012    Procedure: EXCISION MASS;  Surgeon: Theodoro Kos, DO;  Location: [place];  Service: Plastics;  Laterality: N/A;  repair of upper lip defect pt is post op  excision of skin cancer with tissue rearrangement or lip switch.  . Left heart catheterization with coronary/graft angiogram N/A 09/01/2013     Procedure: LEFT HEART CATHETERIZATION WITH Beatrix Fetters;  Surgeon: Peter M Martinique, MD;  Location: [facility] CATH LAB;  Service: Cardiovascular;  Laterality: N/A;    Family History  Problem Relation Age of Onset  . Heart attack Father   . Heart disease Sister   . Stroke Brother     Social History:  reports that he quit smoking about 50 years ago. His smoking use included Cigarettes. He started smoking about 63 years ago. He has a 13 pack-year smoking history. He has never used smokeless tobacco. He reports that he does not drink alcohol or use illicit drugs.  Allergies:  Allergies  Allergen Reactions  . Uloric [Febuxostat] Other (See Comments)    Pt states "it locked me up, I couldn't move from the waist up" Upper body paralysis-temporary  . Ivp Dye [Iodinated Diagnostic Agents] Other (See Comments)    Patient continued sneezing   . Statins Other (See Comments)    Patient unable to walk  . Meloxicam Rash    Medications: I have reviewed the patient's current medications.   Ct Abdomen Pelvis Wo Contrast  Jun 06, 2016  CLINICAL DATA:  Abdominal pain. History bladder cancer. CLL on chemotherapy. Contrast allergy. EXAM: CT ABDOMEN AND PELVIS WITHOUT CONTRAST TECHNIQUE: Multidetector CT imaging of the abdomen and pelvis was performed following the standard protocol without  IV contrast. COMPARISON:  CT abdomen pelvis 04/26/2016 FINDINGS: Lower chest: Mild bronchial wall thickening in the lung bases left greater than right. This shows mild progression from the prior study and could reflect bronchitis. No lobar pneumonia or effusion. Hepatobiliary: Portal venous gas has developed since the prior study. No focal liver mass lesion. Liver size normal. Gallbladder and bile ducts nondilated. Negative for pneumoperitoneum. Pancreas: Negative Spleen: Small spleen without focal lesion Adrenals/Urinary Tract: No renal mass or obstruction. Nonobstructing bilateral renal calculi. Layering calcifications  in the bladder compatible with bladder calculi. These were not present on the prior CT. Stomach/Bowel: Negative for bowel obstruction. Mild pneumatosis in the stomach. Curvilinear extraluminal gas is seen around the stomach most compatible with venous gas. No gastric thickening or mass lesion. Pneumatosis also present in small bowel and colon. Negative for bowel obstruction. Sigmoid diverticulosis without diverticulitis. Moderate hiatal hernia Vascular/Lymphatic: Atherosclerotic calcification in the aorta and iliac arteries. No aneurysm. Improvement in mesenteric adenopathy since the prior study. Lymph nodes are now all under 1 cm. Perirectal lymph node also significantly improved following chemotherapy. Reproductive: Prostate enlargement with prostate calcification Other: Mild amount of free fluid around the liver. Small amount of free fluid between bowel loops in the abdomen. Negative for pneumoperitoneum. Musculoskeletal: Grade 1 slip L5-S1 with bilateral pars defects of L5. No fracture or focal bony lesion. IMPRESSION: Portal venous gas in the liver. There is also venous gas around the stomach. No gastric edema. There is pneumatosis involving the colon and small bowel. Small amount of free fluid around the liver. Differential diagnosis includes bowel ischemia. Benign pneumatosis also in the differential. Currently the patient has abdominal pain. Surgical consultation recommended. Improvement in mesenteric and abdominal adenopathy since 04/26/2016 related to chemotherapy. These results were called by telephone at the time of interpretation on May 15, 2016 at 10:04 am to Dr. Niel Hummer , who verbally acknowledged these results. Electronically Signed   By: Franchot Gallo M.D.   On: 05-15-16 10:05   Dg Chest 2 View  May 15, 2016  CLINICAL DATA:  Shortness of breath and upper abdominal pain. EXAM: CHEST  2 VIEW COMPARISON:  02/28/2016 FINDINGS: Postoperative changes in the mediastinum. Normal heart size and  pulmonary vascularity. No focal airspace disease or consolidation in the lungs. No blunting of costophrenic angles. No pneumothorax. Mediastinal contours appear intact. Degenerative changes in the thoracic spine. Old left rib fractures. Calcification of the aorta. Degenerative changes in the right shoulder. IMPRESSION: No active cardiopulmonary disease. Electronically Signed   By: Lucienne Capers M.D.   On: 2016/05/15 02:32   Dg Abd 1 View  05-15-16  CLINICAL DATA:  Acute onset of lower mid abdominal pain. No bowel movements for 1 week. Initial encounter. EXAM: ABDOMEN - 1 VIEW COMPARISON:  CT of the abdomen and pelvis from 04/26/2016 FINDINGS: The visualized bowel gas pattern is unremarkable. Scattered air and stool filled loops of colon are seen; no abnormal dilatation of small bowel loops is seen to suggest small bowel obstruction. No free intra-abdominal air is identified, though evaluation for free air is limited on a single supine view. Mild degenerative change is noted along the lumbar spine; the sacroiliac joints are unremarkable in appearance. Chronic sclerotic change is noted at the pubic symphysis and sacroiliac joints. Clips are noted overlying the right upper quadrant. Residual contrast is seen along sigmoid diverticula. IMPRESSION: Unremarkable bowel gas pattern; no free intra-abdominal air seen. Small to moderate amount of stool noted in the colon. Electronically Signed   By: Garald Balding  M.D.   On: 05/17/16 03:47    Review of Systems  Unable to perform ROS: critical illness   Blood pressure 111/54, pulse 100, temperature 93.7 F (34.3 C), temperature source Rectal, resp. rate 16, height 5\' 8"  (1.727 m), weight 71.4 kg (157 lb 6.5 oz), SpO2 100 %. Physical Exam  Constitutional:  In ICU on ventilator. Family at bedside.  HENT:  Head: Normocephalic.  Eyes: Pupils are equal, round, and reactive to light.  Neck: Normal range of motion.  Cardiovascular:  Tachycardic by bedside  monitor.   Respiratory: Effort normal.  GI: Soft.  Genitourinary:  Foley now c/d/i with light pink urine  Musculoskeletal: Normal range of motion.  Neurological:  GCS 3T    Assessment/Plan:  1 - Difficult Foley / Urethral Stricture / Enlarged Prostate - Pt underwent successful cysto, dilation, placement of catheter over wire today at time of surgery. May DC when not needed for accurate UOP monitoring.   2 - Bladder Cancer - no recurrence by OR cysto today.     Vineet Kinney 2016/05/17, 5:16 PM

## 2016-05-26 NOTE — Transfer of Care (Signed)
Immediate Anesthesia Transfer of Care Note  Patient: [name] [name]  Procedure(s) Performed: Procedure(s): EXPLORATORY LAPAROTOMY BIOPSY OF OMENTUM (N/A) CYSTOSCOPY WITH URETHRAL DILATATION (N/A)  Patient Location: ICU  Anesthesia Type:General  Level of Consciousness: sedated and unresponsive  Airway & Oxygen Therapy: Patient remains intubated per anesthesia plan  Post-op Assessment: Report given to RN and Post -op Vital signs reviewed and stable  Post vital signs: Reviewed and stable  Last Vitals:  Filed Vitals:   06-04-16 1330 2016/06/04 1340  BP: 111/54   Pulse: 93 101  Temp:    Resp: 26 26    Last Pain:  Filed Vitals:   2016/06/04 1340  PainSc: 10-Worst pain ever         Complications: No apparent anesthesia complications

## 2016-05-26 NOTE — ED Notes (Signed)
Attempted to call report; RN unable to take report at this time.

## 2016-05-26 NOTE — Progress Notes (Signed)
Pt's wife, daughter, son and a host of other relatives and friends were bedside when I arrived. A friend noted that pt's wife was not at peace w/letting her husband go. Pt's wife said she didn't want to feel like she was killing him. Early on family had spoken of their faith. Pt's wife and I talked about their conversations b4 tonight. She talked of his wishes and his permission to let him go. She resolved her apprehension realizing and stating everything is in God's hands - "if He wants him to breathe he will, if not he will be with God."  She found peace and she and daughter requested prayer. Daughter was tearful but she and her brother were at peace. After extubation pt and demise, CH assisted family as they began their fresh journey of grieving their loss. Family wanted prayer again, starting w;/Tim (son) and other family members.  Prayer concluded w/Chaplain and two community pastors who were present. One family member was overtaken w/grief and admitted to having panic attacks. Staff offered a wheel chair, however she declined and walked out w/other family members. Family members and friends were appropriately tearful. CH provided emotional, spiritual and comforting support.  Chaplain Ernest Haber, M.Div.   May 15, 2016 2200  Clinical Encounter Type  Visited With Family

## 2016-05-26 NOTE — Anesthesia Postprocedure Evaluation (Signed)
Anesthesia Post Note  Patient: [name] [name]  Procedure(s) Performed: Procedure(s) (LRB): EXPLORATORY LAPAROTOMY BIOPSY OF OMENTUM (N/A) CYSTOSCOPY WITH URETHRAL DILATATION (N/A)  Patient location during evaluation: ICU Anesthesia Type: General Level of consciousness: sedated Pain management: pain level controlled Vital Signs Assessment: post-procedure vital signs reviewed and stable Respiratory status: patient remains intubated per anesthesia plan Cardiovascular status: blood pressure returned to baseline and stable Postop Assessment: no signs of nausea or vomiting Anesthetic complications: no    Last Vitals:  Filed Vitals:   June 05, 2016 1700 06-05-2016 1715  BP:    Pulse:    Temp:    Resp: 16 7    Last Pain:  Filed Vitals:   05-Jun-2016 1716  PainSc: 10-Worst pain ever                 Mansel Strother L

## 2016-05-26 NOTE — Op Note (Signed)
NAMEMarland Kitchen  [name], [name] NO.:  [id number]  MEDICAL RECORD NO.:  [id number]  LOCATION:  51                         FACILITY:  [facility]  PHYSICIAN:  Alexis Frock, MD     DATE OF BIRTH:  [birth year]  DATE OF PROCEDURE: 31-May-2016                               OPERATIVE REPORT  PREOPERATIVE DIAGNOSIS:  History of bladder cancer, likely ischemic bowel, undergoing exploratory laparotomy with difficult Foley placement.  POSTOPERATIVE DIAGNOSES:  Significant benign prostatic hypertrophy, mild urethral stricture.  PROCEDURE:  Cystoscopy with urethral dilation and catheter placement complicated.  ESTIMATED BLOOD LOSS:  Nil.  COMPLICATION:  None.  SPECIMEN:  None.  DRAINS:  A 20-French Foley catheter to straight drain, 10 mL sterile water in the balloon.  INDICATION:  [name] [name] is an unfortunate 82 year old gentleman with long history of bladder tumors and obstructive voiding, normally follows with Dr. Jeffie Pollock.  He is currently admitted for abdominal pain with questionable ischemic bowel, is undergoing exploratory laparotomy today. He had attempted a Foley catheter placement earlier today, this was unsuccessful with excessive resistance felt, this was concerning for possible stricture and the Urology consultation was sought for catheter placement intraoperatively.  Informed consent was obtained and placed in the medical record.  PROCEDURE IN DETAIL:  The patient being [name] [name], was verified. Procedure being cystoscopy with catheter placement was confirmed. Procedure was carried out.  Time-out was performed.  Intravenous antibiotics were administered.  General endotracheal anesthesia was introduced.  The patient was placed in a supine position and sterile field was created by prepping and draping the patient's penis and perineum using iodine.  Cystourethroscopy was performed using 16-French flexible cystoscope.  Inspection of the anterior and posterior  urethra revealed some significant tortuosity with relative narrowing at the area of the prostatic urethra with severe bilobar hypertrophy.  There was minimal urethral stricture, which was dilated with the scope itself. Urinary bladder was trabeculated diffusely, there were no papillary lesions seen.  A 0.038 Sensor wire was advanced, coiled at the level of the urinary bladder and the cystoscope was exchanged for a new 20-French Council-type catheter with 10 mL sterile water in the balloon.  Dissection to straight drain.  There was efflux of copious clear yellow urine and the procedure was terminated.  The patient tolerated the procedure well.  There were no immediate periprocedural complications.  The patient will remain in the operating room for his general surgery portion of the procedure today.          ______________________________ Alexis Frock, MD     TM/MEDQ  D:  05/31/16  T:  05-31-2016  Job:  [id number]

## 2016-05-26 NOTE — Progress Notes (Signed)
I was called by Dr. 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 Dr. Lucia Gaskins was unable to perform a resection. [name] [name] has worsening acidosis and is not likely to survive the night. He was made DNR, start fentanyl drip. Wean off neo We will not perform aggressive measures or escalation of care. He remains intubated The family will let us know when they are ready to extubate to full comfort care. Discussed with his wife.  Marshell Garfinkel MD [facility] Pager [phone] If no answer or after 3pm call: [phone] 06/07/16, 5:22 PM

## 2016-05-26 NOTE — Progress Notes (Signed)
Inpatient Diabetes Program Recommendations  AACE/ADA: New Consensus Statement on Inpatient Glycemic Control (2015)  Target Ranges:  Prepandial:   less than 140 mg/dL      Peak postprandial:   less than 180 mg/dL (1-2 hours)      Critically ill patients:  140 - 180 mg/dL   Results for TREYSEAN, SOINE (MRN [id number]) as of 06-07-2016 09:49  Ref. Range 06-07-16 02:25  Glucose Latest Ref Range: 65-99 mg/dL 221 (H)    Admit with: SOB/ Sepsis/ Abd Pain  History: CKD, CAD, CLL     MD- Note patient currently receiving IV Solucortef 100 mg Q8 hours.  AM lab glucose elevated today.  Please consider starting Novolog Sensitive Correction Scale/ SSI (0-9 units) Q4 hours      --Will follow patient during hospitalization--  Wyn Quaker RN, MSN, CDE Diabetes Coordinator Inpatient Glycemic Control Team Team Pager: [phone] (8a-5p)

## 2016-05-26 DEATH — deceased

## 2016-05-30 ENCOUNTER — Other Ambulatory Visit: Payer: Medicare Other

## 2016-05-30 ENCOUNTER — Ambulatory Visit: Payer: Medicare Other | Admitting: Family

## 2016-05-30 ENCOUNTER — Ambulatory Visit: Payer: Medicare Other

## 2016-05-31 ENCOUNTER — Ambulatory Visit: Payer: Medicare Other

## 2016-06-13 ENCOUNTER — Ambulatory Visit: Payer: Medicare Other | Admitting: Cardiology

## 2016-06-27 ENCOUNTER — Ambulatory Visit: Payer: Medicare Other | Admitting: Hematology & Oncology

## 2016-06-27 ENCOUNTER — Other Ambulatory Visit: Payer: Medicare Other

## 2016-06-27 ENCOUNTER — Ambulatory Visit: Payer: Medicare Other

## 2016-06-28 ENCOUNTER — Ambulatory Visit: Payer: Medicare Other

## 2016-07-25 ENCOUNTER — Ambulatory Visit: Payer: Medicare Other

## 2016-07-25 ENCOUNTER — Other Ambulatory Visit: Payer: Medicare Other

## 2016-07-25 ENCOUNTER — Ambulatory Visit: Payer: Medicare Other | Admitting: Hematology & Oncology

## 2016-07-26 ENCOUNTER — Ambulatory Visit: Payer: Medicare Other

## 2016-11-09 ENCOUNTER — Other Ambulatory Visit: Payer: Self-pay | Admitting: Nurse Practitioner

## 2018-01-19 IMAGING — CT CT CHEST W/ CM
2 of 5 series · 12 of 36 positions shown, 15 images · IV contrast (APPLIED)
Comparison: None.

CLINICAL DATA: Chronic lymphocytic leukemia and non-Hodgkin's
leukemia. Subsequent treatment strategy.

EXAM:
CT CHEST, ABDOMEN, AND PELVIS WITH CONTRAST
TECHNIQUE: Multidetector CT imaging of the chest, abdomen and pelvis was
performed following the standard protocol during bolus
administration of intravenous contrast.
CONTRAST:  80mL OMNIPAQUE IOHEXOL 300 MG/ML  SOLN

[Series 2: cap with 2 · axial · 0.92mm/px · z∈[-686,-96]mm · 9 of 136 slices shown, 12 images]
[im 9/136  mediastinal]
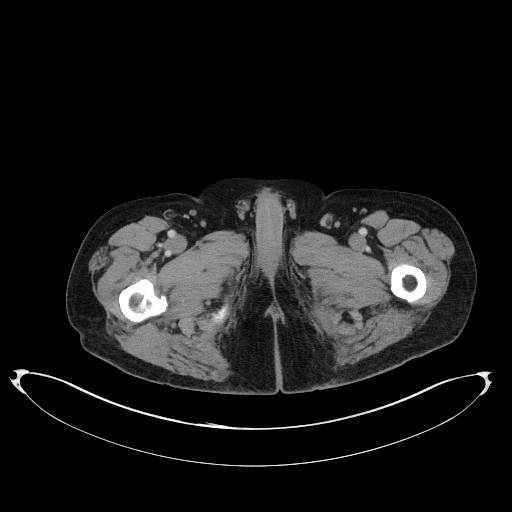
[im 9/136  lung]
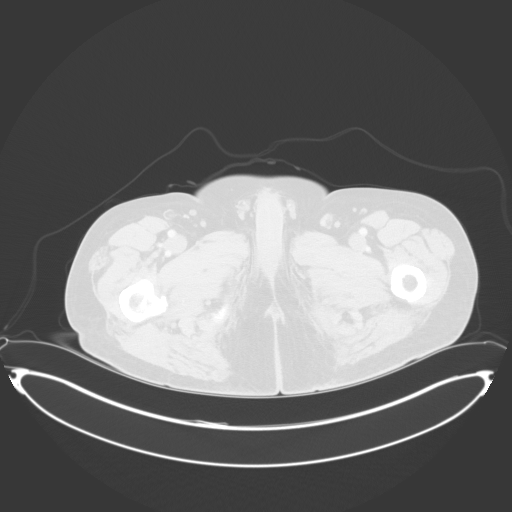
[im 26/136  lung]
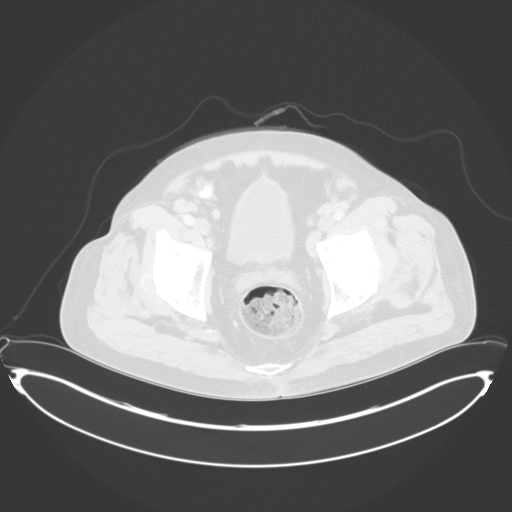
[im 43/136  lung]
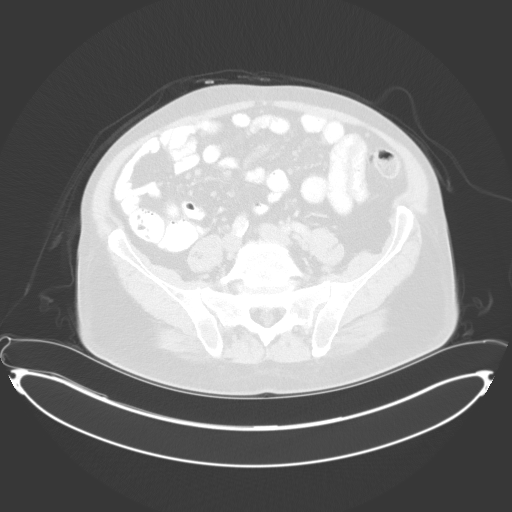
[im 51/136  lung]
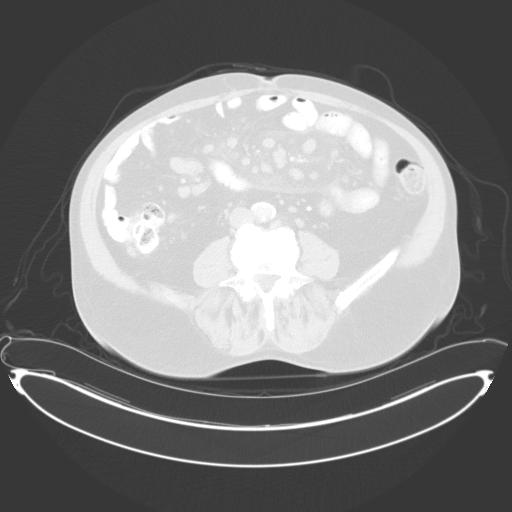
[im 68/136  mediastinal]
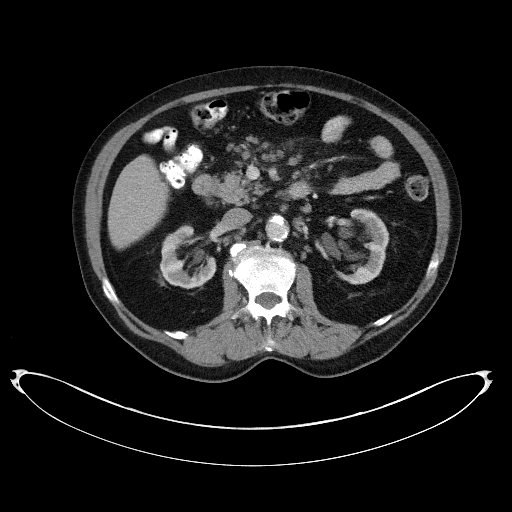
[im 68/136  lung]
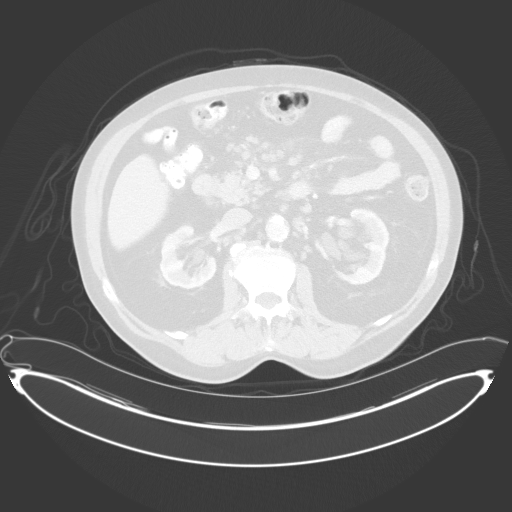
[im 85/136  lung]
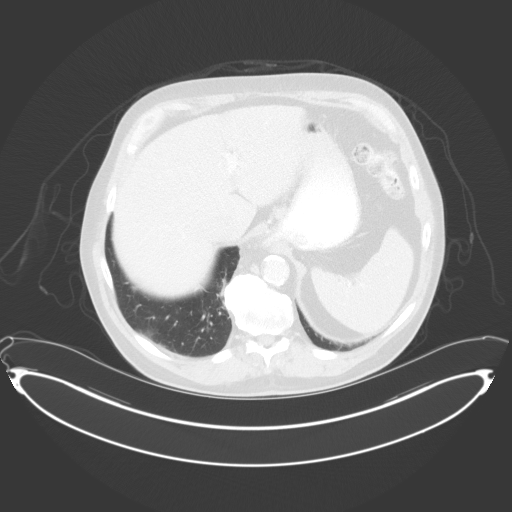
[im 93/136  lung]
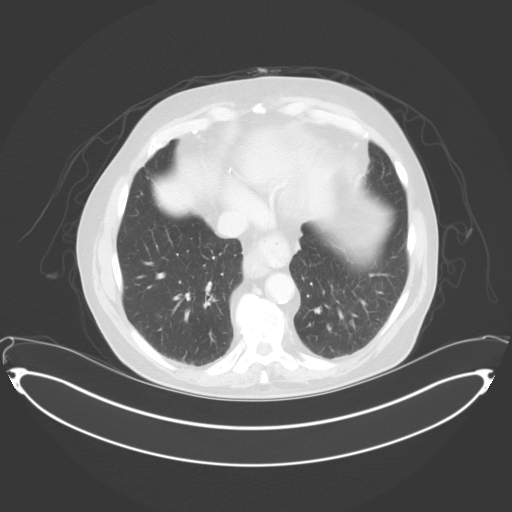
[im 110/136  lung]
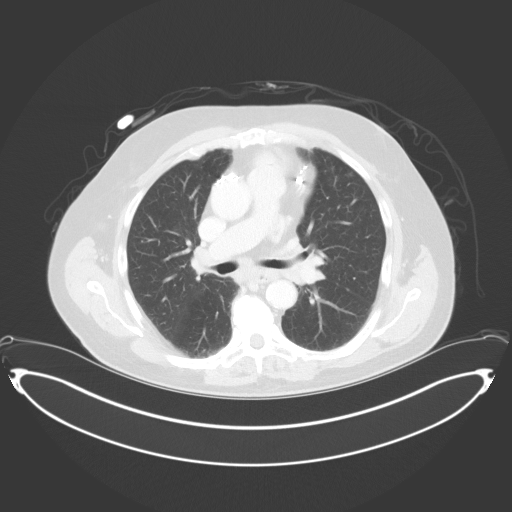
[im 127/136  mediastinal]
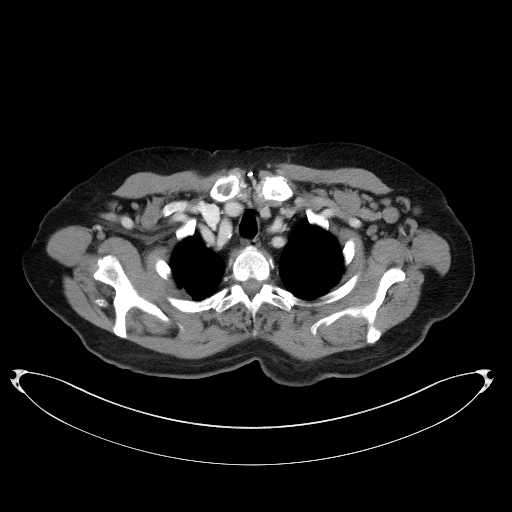
[im 127/136  lung]
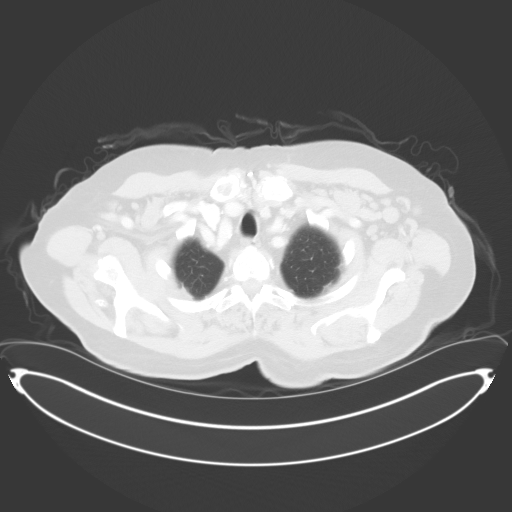

[Series 5: coronals · coronal · 0.78mm/px · 3 of 158 slices shown]
[im 32/158  lung]
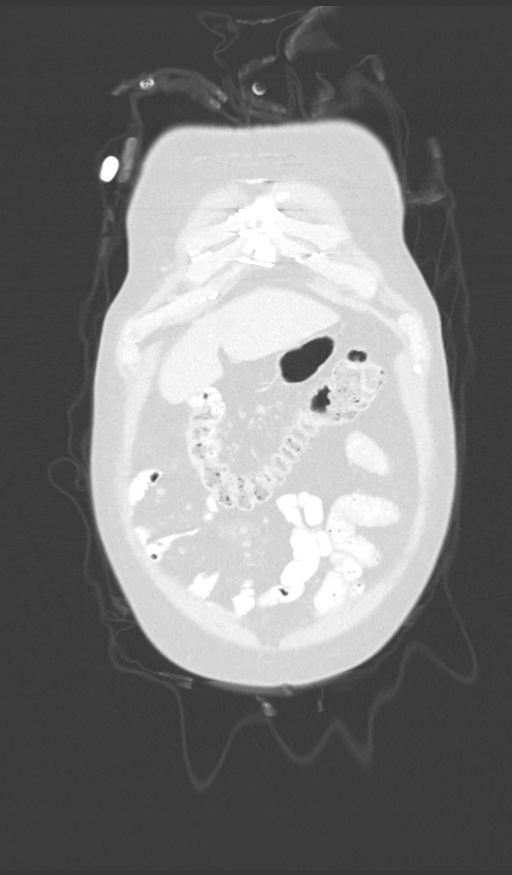
[im 63/158  lung]
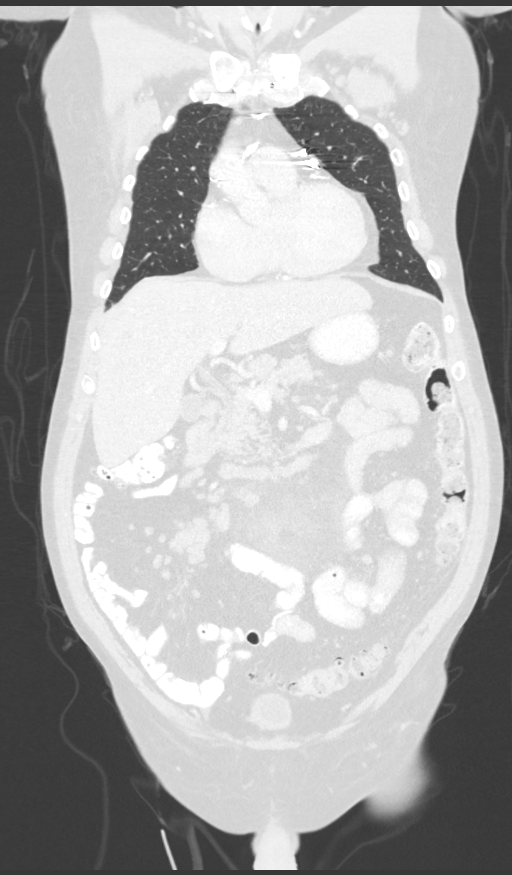
[im 95/158  lung]
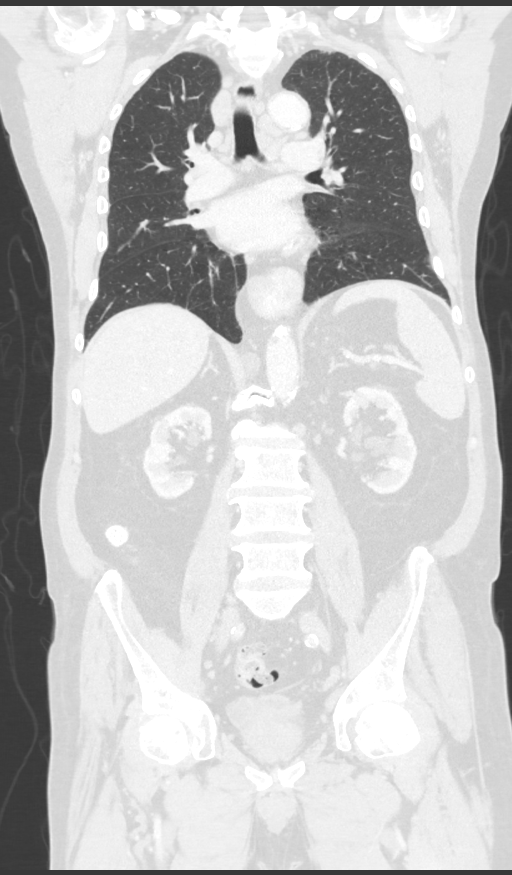

[12 of 36 positions shown; findings below may reference images not displayed]

FINDINGS: CT CHEST FINDINGS

Mediastinum/Nodes: Bilateral enlarged axillary lymph nodes similar
to comparison exam. 17 mm short axis lymph node in the LEFT axilla
(image 14, series 2 compares to 17 mm. 16 mm short axis lymph node
in the RIGHT axilla compares to 16 mm. The number of lymph nodes is
similar. Mild bilateral supraclavicular adenopathy noted.

In the mediastinum mildly enlarged lymph nodes are again
demonstrated. For example RIGHT lower paratracheal lymph node
measures 13 mm short axis compared to 13 mm.

No central pulmonary embolism. No pericardial fluid. Esophagus is
normal

Lungs/Pleura: There is interval clearing of the consolidative
opacities in the LEFT and RIGHT lower lobe compared to prior exam.
There is several branching nodular densities in the RIGHT upper lobe
(Image 19 through 22 of series 4) which have an infectious pattern.
Similar nodule in the lingula on image 25, series 4 and within the
LEFT lower lobe on image 32, series 4.

Musculoskeletal: No aggressive osseous lesion.

CT ABDOMEN AND PELVIS FINDINGS

Hepatobiliary:  No focal hepatic lesion.  Gallbladder normal.

Pancreas: Pancreas is normal. No ductal dilatation. No pancreatic
inflammation.

Spleen: Normal volume spleen.

Adrenals/urinary tract: Adrenal glands and kidneys are normal. The
ureters and bladder normal.

Stomach/Bowel: Stomach, small bowel, appendix, and cecum are normal.
The colon and rectosigmoid colon are normal. There are multiple
diverticula of the sigmoid colon.

Vascular/Lymphatic: Abdominal aorta is normal caliber with
atherosclerotic calcification.

Innumerable enlarged central mesenteric lymph nodes are again
demonstrated. The pattern is very similar to comparison exam.
Example lymph node in the LEFT lower quadrant measures 11 mm short
axis image 46, series 5, coronal. Larger lymph nodes in the
ileocecal mesentery measures 22 mm (image 54, series 5) compared to
a 22 mm (Coronal projection).

Enlarged periportal adenopathy also similar. Example lymph node
measures 28 mm on image 64, series 2 unchanged.

Several pelvic lymph nodes do measure slightly larger. For example
12 mm RIGHT external iliac/inguinal lymph node (image 117, series 2)
is increased from 11 mm. Lymph node adjacent to the rectum in the
presacral fat measures 14 mm (image 105) compared to 11 mm.

Reproductive: Prostate normal with central calcification

Other: No free fluid.

Musculoskeletal: No aggressive osseous lesion.
IMPRESSION: Chest Impression:

1. Stable enlarged axillary and mediastinal lymph nodes.
2. Resolution of previously seen lower lobe pneumonia.
3. New branching nodules within the RIGHT upper lobe are suggests an
infectious etiology. Several small nodule in the lingula and LEFT
lower lobe. Also favor infectious etiology.

Abdomen / Pelvis Impression:

1. Stable extensive adenopathy within the mesentery, porta hepatis
and along the aorta
2. Mild increase in size of lymph nodes in the pelvis including a
RIGHT inguinal node and perirectal node.
3. Normal splenic volume.
4.  Atherosclerotic calcification of the abdominal aorta.
